# Patient Record
Sex: Female | Born: 1970 | Race: White | Hispanic: No | Smoking: Current every day smoker
Health system: Southern US, Community
[De-identification: ages and names within clinical notes are randomized; demographics above are authoritative.]

## PROBLEM LIST (undated history)

## (undated) DIAGNOSIS — K219 Gastro-esophageal reflux disease without esophagitis: Secondary | ICD-10-CM

## (undated) DIAGNOSIS — G629 Polyneuropathy, unspecified: Secondary | ICD-10-CM

## (undated) DIAGNOSIS — T7840XA Allergy, unspecified, initial encounter: Secondary | ICD-10-CM

## (undated) DIAGNOSIS — E1140 Type 2 diabetes mellitus with diabetic neuropathy, unspecified: Secondary | ICD-10-CM

## (undated) DIAGNOSIS — F329 Major depressive disorder, single episode, unspecified: Secondary | ICD-10-CM

## (undated) DIAGNOSIS — R519 Headache, unspecified: Secondary | ICD-10-CM

## (undated) DIAGNOSIS — F32A Depression, unspecified: Secondary | ICD-10-CM

## (undated) DIAGNOSIS — Z972 Presence of dental prosthetic device (complete) (partial): Secondary | ICD-10-CM

## (undated) DIAGNOSIS — F419 Anxiety disorder, unspecified: Secondary | ICD-10-CM

## (undated) DIAGNOSIS — R51 Headache: Secondary | ICD-10-CM

## (undated) DIAGNOSIS — Z8669 Personal history of other diseases of the nervous system and sense organs: Secondary | ICD-10-CM

## (undated) DIAGNOSIS — D649 Anemia, unspecified: Secondary | ICD-10-CM

## (undated) DIAGNOSIS — N390 Urinary tract infection, site not specified: Secondary | ICD-10-CM

## (undated) HISTORY — DX: Personal history of other diseases of the nervous system and sense organs: Z86.69

## (undated) HISTORY — DX: Depression, unspecified: F32.A

## (undated) HISTORY — DX: Allergy, unspecified, initial encounter: T78.40XA

## (undated) HISTORY — PX: TONSILLECTOMY: SUR1361

## (undated) HISTORY — DX: Gastro-esophageal reflux disease without esophagitis: K21.9

## (undated) HISTORY — DX: Major depressive disorder, single episode, unspecified: F32.9

---

## 1996-09-17 HISTORY — PX: TUBAL LIGATION: SHX77

## 2006-04-18 ENCOUNTER — Emergency Department: Payer: Self-pay | Admitting: Unknown Physician Specialty

## 2006-04-20 ENCOUNTER — Other Ambulatory Visit: Payer: Self-pay

## 2006-04-20 ENCOUNTER — Emergency Department: Payer: Self-pay | Admitting: Emergency Medicine

## 2007-10-22 ENCOUNTER — Other Ambulatory Visit: Payer: Self-pay

## 2007-10-22 ENCOUNTER — Ambulatory Visit: Payer: Self-pay | Admitting: Internal Medicine

## 2007-11-27 ENCOUNTER — Ambulatory Visit: Payer: Self-pay | Admitting: Internal Medicine

## 2008-09-08 ENCOUNTER — Ambulatory Visit: Payer: Self-pay | Admitting: Specialist

## 2008-11-21 ENCOUNTER — Emergency Department: Payer: Self-pay | Admitting: Emergency Medicine

## 2010-09-30 ENCOUNTER — Emergency Department: Payer: Self-pay | Admitting: Emergency Medicine

## 2011-01-10 ENCOUNTER — Emergency Department: Payer: Self-pay | Admitting: Emergency Medicine

## 2011-02-09 ENCOUNTER — Emergency Department: Payer: Self-pay | Admitting: Emergency Medicine

## 2011-02-12 ENCOUNTER — Emergency Department: Payer: Self-pay | Admitting: Emergency Medicine

## 2011-07-09 ENCOUNTER — Emergency Department: Payer: Self-pay | Admitting: *Deleted

## 2011-07-18 ENCOUNTER — Encounter: Payer: Self-pay | Admitting: Internal Medicine

## 2011-07-18 ENCOUNTER — Ambulatory Visit (INDEPENDENT_AMBULATORY_CARE_PROVIDER_SITE_OTHER): Payer: PRIVATE HEALTH INSURANCE | Admitting: Internal Medicine

## 2011-07-18 VITALS — BP 136/84 | HR 88 | Resp 16 | Ht 64.0 in | Wt 206.0 lb

## 2011-07-18 DIAGNOSIS — F411 Generalized anxiety disorder: Secondary | ICD-10-CM

## 2011-07-18 DIAGNOSIS — IMO0002 Reserved for concepts with insufficient information to code with codable children: Secondary | ICD-10-CM

## 2011-07-18 DIAGNOSIS — E1140 Type 2 diabetes mellitus with diabetic neuropathy, unspecified: Secondary | ICD-10-CM | POA: Insufficient documentation

## 2011-07-18 DIAGNOSIS — Z124 Encounter for screening for malignant neoplasm of cervix: Secondary | ICD-10-CM | POA: Insufficient documentation

## 2011-07-18 DIAGNOSIS — K219 Gastro-esophageal reflux disease without esophagitis: Secondary | ICD-10-CM

## 2011-07-18 DIAGNOSIS — Z1239 Encounter for other screening for malignant neoplasm of breast: Secondary | ICD-10-CM

## 2011-07-18 DIAGNOSIS — F419 Anxiety disorder, unspecified: Secondary | ICD-10-CM

## 2011-07-18 DIAGNOSIS — E119 Type 2 diabetes mellitus without complications: Secondary | ICD-10-CM

## 2011-07-18 DIAGNOSIS — E1165 Type 2 diabetes mellitus with hyperglycemia: Secondary | ICD-10-CM

## 2011-07-18 MED ORDER — ALPRAZOLAM 0.5 MG PO TABS: 0.5000 mg | ORAL_TABLET | Freq: Two times a day (BID) | ORAL | Status: DC | PRN

## 2011-07-18 NOTE — Patient Instructions (Signed)
Please check your sugars three times daily    1) Fasting  2) pre lunch or a pre dinner  3) a 2 hr post meal check    We will start 70/30 insulin after I have a chance to see your sugars for the next week

## 2011-07-18 NOTE — Progress Notes (Signed)
  Subjective:    Patient ID: [name], female    DOB: Aug 20, 1971, 40 y.o.   MRN: [id number]  HPI  [name] is 40 yo white female with a history of DM x 8 yrs, uncontrolled due to financial difficulties and lack of insurance ,who is transferring care from [facility]. Her current diabetes regimen is currently 1000 mg of metformin bid,   glipizide 5 mg bid,  And 30 units of lantus daily, which she has increased to 40 units.  Her fasting cbg this morning was 240, which is reflective of recent trends,  However , she recently finished a 5 day course of prednisone 50 mg daily  Which was prescribed by an  [facility] ED physician for  An upper respiratory infection accompanied by bronchospasm  .   Past Medical History  Diagnosis Date  . Diabetes mellitus   . allergic rhinitis   . Depression   . GERD (gastroesophageal reflux disease)   . History of migraine headaches    No current outpatient prescriptions on file prior to visit.    Review of Systems  Constitutional: Positive for fever. Negative for chills and unexpected weight change.  HENT: Negative for hearing loss, ear pain, nosebleeds, congestion, sore throat, facial swelling, rhinorrhea, sneezing, mouth sores, trouble swallowing, neck pain, neck stiffness, voice change, postnasal drip, sinus pressure, tinnitus and ear discharge.   Eyes: Negative for pain, discharge, redness and visual disturbance.  Respiratory: Negative for cough, chest tightness, shortness of breath, wheezing and stridor.   Cardiovascular: Negative for chest pain, palpitations and leg swelling.  Musculoskeletal: Negative for myalgias and arthralgias.  Skin: Negative for color change and rash.  Neurological: Negative for dizziness, weakness, light-headedness and headaches.  Hematological: Negative for adenopathy.       Objective:   Physical Exam  Constitutional: She is oriented to person, place, and time. She appears well-developed and well-nourished.  HENT:  Mouth/Throat:  Oropharynx is clear and moist.  Eyes: EOM are normal. Pupils are equal, round, and reactive to light. No scleral icterus.  Neck: Normal range of motion. Neck supple. No JVD present. No thyromegaly present.  Cardiovascular: Normal rate, regular rhythm, normal heart sounds and intact distal pulses.   Pulmonary/Chest: Effort normal and breath sounds normal.  Abdominal: Soft. Bowel sounds are normal. She exhibits no mass. There is no tenderness.  Musculoskeletal: Normal range of motion. She exhibits no edema.  Lymphadenopathy:    She has no cervical adenopathy.  Neurological: She is alert and oriented to person, place, and time.  Skin: Skin is warm and dry.  Psychiatric: She has a normal mood and affect.          Assessment & Plan:

## 2011-07-21 ENCOUNTER — Encounter: Payer: Self-pay | Admitting: Internal Medicine

## 2011-07-21 DIAGNOSIS — K219 Gastro-esophageal reflux disease without esophagitis: Secondary | ICD-10-CM | POA: Insufficient documentation

## 2011-07-21 DIAGNOSIS — F329 Major depressive disorder, single episode, unspecified: Secondary | ICD-10-CM | POA: Insufficient documentation

## 2011-07-21 DIAGNOSIS — Z8669 Personal history of other diseases of the nervous system and sense organs: Secondary | ICD-10-CM | POA: Insufficient documentation

## 2011-07-21 NOTE — Assessment & Plan Note (Signed)
Her current regimen is not covering postprandial highs or getting her fastings under control.  She has requested referral to Diabetes Education since as an [employer] employee she will receive free labs.  Referral in process.  Will change to 70/30 insulin once she supplies a log of blood sugars for one to two weeks. She has no retinopathy by Oct 23 eye exam.

## 2011-07-21 NOTE — Assessment & Plan Note (Addendum)
With no prior endoscopy.  Using ranitidine with good  control of symptoms, no changes today.

## 2011-08-13 ENCOUNTER — Ambulatory Visit: Payer: Self-pay | Admitting: Internal Medicine

## 2011-08-14 ENCOUNTER — Telehealth: Payer: Self-pay | Admitting: Internal Medicine

## 2011-08-14 NOTE — Telephone Encounter (Signed)
Patient notified. Appt scheduled for Monday.

## 2011-08-14 NOTE — Telephone Encounter (Signed)
I have no experience with this drug,  It must have been prescribed by her gynecologist. Adn we have not discussed her menstrual irregularities in a visit.  So she will wither need to call her gynecologist to handle or make an appt to discuss problem with me

## 2011-08-14 NOTE — Telephone Encounter (Addendum)
Patient is taking Reclipsen Adonis Brook) generic for dysmenorrhea. She feels she needs this medication changed because her bleeding is still on and off . Patient would like a call.

## 2011-08-17 LAB — HM PAP SMEAR: HM Pap smear: NORMAL

## 2011-08-18 ENCOUNTER — Ambulatory Visit: Payer: Self-pay | Admitting: Internal Medicine

## 2011-08-20 ENCOUNTER — Ambulatory Visit (INDEPENDENT_AMBULATORY_CARE_PROVIDER_SITE_OTHER): Payer: PRIVATE HEALTH INSURANCE | Admitting: Internal Medicine

## 2011-08-20 ENCOUNTER — Encounter: Payer: Self-pay | Admitting: Internal Medicine

## 2011-08-20 VITALS — BP 100/64 | HR 95 | Temp 98.2°F | Resp 16 | Ht 64.0 in | Wt 201.8 lb

## 2011-08-20 DIAGNOSIS — F411 Generalized anxiety disorder: Secondary | ICD-10-CM

## 2011-08-20 DIAGNOSIS — N920 Excessive and frequent menstruation with regular cycle: Secondary | ICD-10-CM

## 2011-08-20 DIAGNOSIS — F419 Anxiety disorder, unspecified: Secondary | ICD-10-CM

## 2011-08-20 DIAGNOSIS — N921 Excessive and frequent menstruation with irregular cycle: Secondary | ICD-10-CM

## 2011-08-20 DIAGNOSIS — E1165 Type 2 diabetes mellitus with hyperglycemia: Secondary | ICD-10-CM

## 2011-08-20 MED ORDER — SERTRALINE HCL 100 MG PO TABS: 100.0000 mg | ORAL_TABLET | Freq: Every day | ORAL | Status: DC

## 2011-08-20 MED ORDER — ALPRAZOLAM 0.5 MG PO TABS: 0.5000 mg | ORAL_TABLET | Freq: Two times a day (BID) | ORAL | Status: DC | PRN

## 2011-08-20 MED ORDER — DESOGESTREL-ETHINYL ESTRADIOL 0.15-30 MG-MCG PO TABS: 1.0000 | ORAL_TABLET | Freq: Every day | ORAL | Status: DC

## 2011-08-20 MED ORDER — INSULIN ASPART 100 UNIT/ML ~~LOC~~ SOLN: 15.0000 [IU] | Freq: Three times a day (TID) | SUBCUTANEOUS | Status: DC

## 2011-08-20 NOTE — Patient Instructions (Signed)
We are stopping your glipizde and adding Novolog short acting insulin three times daily before meals.  Start with 15 units before each meal.  If your pre meal cbg is > 250,  Add 3 units for every 50 pts above 250.   We will schedule you for an ultasound of your uterus to see why you are bleeding so much

## 2011-08-20 NOTE — Progress Notes (Signed)
Subjective:    Patient ID: [name], female    DOB: 1971-02-17, 40 y.o.   MRN: [id number]  HPI  40 yo with history of diabetes melllitus and painful menses with heavy clotting, placed  On OCPs by prior PCP presents with intermenstrual bleeding and spotting.  No prior ultrasound to consider fibroids as a source .  Bleeding increased when she was switched from name brand to generic OCPS .    2nd issue is uncontrolled BS, have never been below 200.  2 hr post prandialwas recently 415 and fasting today was 300. She is using 40 units of lantus daily since last visit.  Past Medical History  Diagnosis Date  . Diabetes mellitus   . allergic rhinitis   . Depression   . GERD (gastroesophageal reflux disease)   . History of migraine headaches    Current Outpatient Prescriptions on File Prior to Visit  Medication Sig Dispense Refill  . aspirin 81 MG tablet Take 81 mg by mouth daily.        . cycloSPORINE (RESTASIS) 0.05 % ophthalmic emulsion Place 1 drop into both eyes 2 (two) times daily.        Marland Kitchen glipiZIDE (GLUCOTROL XL) 5 MG 24 hr tablet Take 5 mg by mouth daily.        . insulin glargine (LANTUS) 100 UNIT/ML injection Inject 30 Units into the skin at bedtime.        Marland Kitchen loteprednol (LOTEMAX) 0.5 % ophthalmic suspension Place 1 drop into both eyes 2 (two) times daily.        . metFORMIN (GLUMETZA) 500 MG (MOD) 24 hr tablet Take 1,000 mg by mouth daily with breakfast.        . ranitidine (ZANTAC) 150 MG tablet Take 150 mg by mouth 2 (two) times daily.           Review of Systems  Constitutional: Negative for fever, chills and unexpected weight change.  HENT: Negative for hearing loss, ear pain, nosebleeds, congestion, sore throat, facial swelling, rhinorrhea, sneezing, mouth sores, trouble swallowing, neck pain, neck stiffness, voice change, postnasal drip, sinus pressure, tinnitus and ear discharge.   Eyes: Negative for pain, discharge, redness and visual disturbance.  Respiratory: Negative for  cough, chest tightness, shortness of breath, wheezing and stridor.   Cardiovascular: Negative for chest pain, palpitations and leg swelling.  Genitourinary: Positive for vaginal bleeding and menstrual problem.  Musculoskeletal: Negative for myalgias and arthralgias.  Skin: Negative for color change and rash.  Neurological: Negative for dizziness, weakness, light-headedness and headaches.  Hematological: Negative for adenopathy.      BP 100/64  Pulse 95  Temp(Src) 98.2 F (36.8 C) (Oral)  Resp 16  Ht 5\' 4"  (1.626 m)  Wt 201 lb 12 oz (91.513 kg)  BMI 34.63 kg/m2  SpO2 96%  LMP 08/19/2011  Objective:   Physical Exam  Constitutional: She is oriented to person, place, and time. She appears well-developed and well-nourished.  HENT:  Mouth/Throat: Oropharynx is clear and moist.  Eyes: EOM are normal. Pupils are equal, round, and reactive to light. No scleral icterus.  Neck: Normal range of motion. Neck supple. No JVD present. No thyromegaly present.  Cardiovascular: Normal rate, regular rhythm, normal heart sounds and intact distal pulses.   Pulmonary/Chest: Effort normal and breath sounds normal.  Abdominal: Soft. Bowel sounds are normal. She exhibits no mass. There is no tenderness.  Musculoskeletal: Normal range of motion. She exhibits no edema.  Lymphadenopathy:    She has no  cervical adenopathy.  Neurological: She is alert and oriented to person, place, and time.  Skin: Skin is warm and dry.  Psychiatric: She has a normal mood and affect.          Assessment & Plan:

## 2011-08-21 ENCOUNTER — Encounter: Payer: Self-pay | Admitting: Internal Medicine

## 2011-08-21 DIAGNOSIS — N921 Excessive and frequent menstruation with irregular cycle: Secondary | ICD-10-CM | POA: Insufficient documentation

## 2011-08-21 NOTE — Assessment & Plan Note (Signed)
Will need to obtain ultrasound to evaluate uterus for fibroids. Will switch to brandname OCPS for now.

## 2011-08-21 NOTE — Assessment & Plan Note (Signed)
Given poor conttol with glipizide and Lantus, will stop glipizide and add short acting insulin pre meals tid

## 2011-08-24 ENCOUNTER — Telehealth: Payer: Self-pay | Admitting: Internal Medicine

## 2011-08-24 MED ORDER — INSULIN PEN NEEDLE 31G X 6 MM MISC: Status: DC

## 2011-08-24 MED ORDER — LANCETS MISC: Status: AC

## 2011-08-24 NOTE — Telephone Encounter (Signed)
Both Rxs have been called in 

## 2011-08-24 NOTE — Telephone Encounter (Signed)
[phone]  Pt called needs rx sent to [facility] [phone] for  Needles for flex pens for insulin pt would like 6mm not 8mm She also needs the lancet for the machine   accu check  aviva

## 2011-08-29 ENCOUNTER — Ambulatory Visit: Payer: Self-pay | Admitting: Internal Medicine

## 2011-08-30 ENCOUNTER — Telehealth: Payer: Self-pay | Admitting: Internal Medicine

## 2011-08-30 NOTE — Telephone Encounter (Signed)
Her pelvic ultrasound was unremarklable,  No fibroids seen .

## 2011-08-31 ENCOUNTER — Telehealth: Payer: Self-pay | Admitting: *Deleted

## 2011-08-31 NOTE — Telephone Encounter (Signed)
Patient notified

## 2011-08-31 NOTE — Telephone Encounter (Signed)
Yes, patient has been notified.   Patient is still having a lot of pain. She is asking what can be done at this point .

## 2011-08-31 NOTE — Telephone Encounter (Signed)
Patient is asking for her [place] results from wed. Have you seen them?

## 2011-08-31 NOTE — Telephone Encounter (Signed)
Yes, sent message to [facility] yesterday.  Normal ultrasound,.  Can you Pls check her box to make sure I routed it?

## 2011-08-31 NOTE — Telephone Encounter (Signed)
My chart notes indicate that her menstrual periods are painful,  Not in between , so she can take alleve or ibuprofen and tylenol every 6 to 8 hours prn.  I

## 2011-09-14 ENCOUNTER — Telehealth: Payer: Self-pay | Admitting: *Deleted

## 2011-09-14 ENCOUNTER — Emergency Department: Payer: Self-pay | Admitting: *Deleted

## 2011-09-14 NOTE — Telephone Encounter (Signed)
Pharmacist at [facility] called to clarify directions on pt's pen needles and lancets- how often is pt to inject insulin and how often to check blood sugar daily.  Instructions on lancet say to check 4 times a day, so if that is correct quantity will need to be increased.  Please call them back at [phone].

## 2011-09-14 NOTE — Telephone Encounter (Signed)
Patient is checking sugars and injecting insulin 4 times daily,  Please increase lancet quantity to 120/month

## 2011-09-17 ENCOUNTER — Ambulatory Visit (INDEPENDENT_AMBULATORY_CARE_PROVIDER_SITE_OTHER): Payer: PRIVATE HEALTH INSURANCE | Admitting: Internal Medicine

## 2011-09-17 ENCOUNTER — Encounter: Payer: Self-pay | Admitting: Internal Medicine

## 2011-09-17 VITALS — BP 122/82 | HR 76 | Temp 97.9°F | Wt 207.0 lb

## 2011-09-17 DIAGNOSIS — D367 Benign neoplasm of other specified sites: Secondary | ICD-10-CM

## 2011-09-17 DIAGNOSIS — D2360 Other benign neoplasm of skin of unspecified upper limb, including shoulder: Secondary | ICD-10-CM

## 2011-09-17 DIAGNOSIS — L0293 Carbuncle, unspecified: Secondary | ICD-10-CM

## 2011-09-17 DIAGNOSIS — L0292 Furuncle, unspecified: Secondary | ICD-10-CM

## 2011-09-17 DIAGNOSIS — R202 Paresthesia of skin: Secondary | ICD-10-CM

## 2011-09-17 DIAGNOSIS — R209 Unspecified disturbances of skin sensation: Secondary | ICD-10-CM

## 2011-09-17 DIAGNOSIS — E1165 Type 2 diabetes mellitus with hyperglycemia: Secondary | ICD-10-CM

## 2011-09-17 MED ORDER — SERTRALINE HCL 100 MG PO TABS: 100.0000 mg | ORAL_TABLET | Freq: Every day | ORAL | Status: DC

## 2011-09-17 MED ORDER — INSULIN ASPART 100 UNIT/ML ~~LOC~~ SOLN: 20.0000 [IU] | Freq: Three times a day (TID) | SUBCUTANEOUS | Status: DC

## 2011-09-17 MED ORDER — INSULIN GLARGINE 100 UNIT/ML ~~LOC~~ SOLN: 55.0000 [IU] | Freq: Every day | SUBCUTANEOUS | Status: DC

## 2011-09-17 MED ORDER — SULFAMETHOXAZOLE-TRIMETHOPRIM 800-160 MG PO TABS: 1.0000 | ORAL_TABLET | Freq: Two times a day (BID) | ORAL | Status: AC

## 2011-09-17 NOTE — Progress Notes (Signed)
Subjective:    Patient ID: [name], female    DOB: January 13, 1971, 40 y.o.   MRN: [id number]  HPI  [name] is a 40 yo diabetic female who presents for evaluation of a painless lump under right arm found two weeks ago.  No history of redness,  Trauma to area (except shaving ) or discharge.  No change in size,  Additionally she, developed numbness on Friday involving the right side of her face and right arm and was sent to ER by employer.  ER evaluation involved a CT of the head, which was normal.  She was treated for lymphadenitis of the right axilla with Augmentin.  One day later she developed pain in the right trapezius muscle , and her righ sided facial numbness spread to the arm and leg. She denies recent exertion,  Current weakness of involved areas,  No nek pain, headaches or fevers . 3rd complaint is persistent hyperglycemia desite use of insulin 4 times daily and dietary adjustments as recommended by Diabetes Educations. Sugars are running 200 to 250 pre meal despite use of Lantus.  She is consuming more than 60 calories daily br detailed renview of her dietary choices, and is not exercising, based on advice apparently given to her by the [facility].  Past Medical History  Diagnosis Date  . Diabetes mellitus   . allergic rhinitis   . Depression   . GERD (gastroesophageal reflux disease)   . History of migraine headaches   . tobacco abuse    Current Outpatient Prescriptions on File Prior to Visit  Medication Sig Dispense Refill  . ALPRAZolam (XANAX) 0.5 MG tablet Take 1 tablet (0.5 mg total) by mouth 2 (two) times daily as needed for sleep or anxiety.  60 tablet  2  . aspirin 81 MG tablet Take 81 mg by mouth daily.        . cycloSPORINE (RESTASIS) 0.05 % ophthalmic emulsion Place 1 drop into both eyes 2 (two) times daily.        Marland Kitchen desogestrel-ethinyl estradiol (RECLIPSEN) 0.15-30 MG-MCG tablet Take 1 tablet by mouth daily.  1 Package  11  . glipiZIDE (GLUCOTROL XL) 5 MG 24 hr tablet  Take 5 mg by mouth daily.        . Insulin Pen Needle 31G X 6 MM MISC Patient test blood sugar two times daily.  100 each  5  . Lancets MISC Patient test blood sugar two times daily.  100 each  5  . metFORMIN (GLUMETZA) 500 MG (MOD) 24 hr tablet Take 1,000 mg by mouth daily with breakfast.        . ranitidine (ZANTAC) 150 MG tablet Take 150 mg by mouth 2 (two) times daily.        Marland Kitchen loteprednol (LOTEMAX) 0.5 % ophthalmic suspension Place 1 drop into both eyes 2 (two) times daily.          Review of Systems  Constitutional: Negative for fever, chills and unexpected weight change.  HENT: Negative for hearing loss, ear pain, nosebleeds, congestion, sore throat, facial swelling, rhinorrhea, sneezing, mouth sores, trouble swallowing, neck pain, neck stiffness, voice change, postnasal drip, sinus pressure, tinnitus and ear discharge.   Eyes: Negative for pain, discharge, redness and visual disturbance.  Respiratory: Negative for cough, chest tightness, shortness of breath, wheezing and stridor.   Cardiovascular: Negative for chest pain, palpitations and leg swelling.  Musculoskeletal: Negative for myalgias and arthralgias.  Skin: Negative for color change and rash.  Neurological: Positive for  numbness. Negative for dizziness, weakness, light-headedness and headaches.  Hematological: Negative for adenopathy.  Psychiatric/Behavioral: Positive for behavioral problems.   BP 122/82  Pulse 76  Temp(Src) 97.9 F (36.6 C) (Oral)  Wt 207 lb (93.895 kg)  LMP 08/19/2011     Objective:   Physical Exam  Constitutional: She is oriented to person, place, and time. She appears well-developed and well-nourished.  HENT:  Mouth/Throat: Oropharynx is clear and moist.  Eyes: EOM are normal. Pupils are equal, round, and reactive to light. No scleral icterus.  Neck: Normal range of motion. Neck supple. No JVD present. No thyromegaly present.  Cardiovascular: Normal rate, regular rhythm, normal heart sounds and  intact distal pulses.   Pulmonary/Chest: Effort normal and breath sounds normal.  Abdominal: Soft. Bowel sounds are normal. She exhibits no mass. There is no tenderness.  Musculoskeletal: Normal range of motion. She exhibits no edema.  Lymphadenopathy:    She has no cervical adenopathy.    She has no axillary adenopathy.  Neurological: She is alert and oriented to person, place, and time. A sensory deficit is present.       Decreased sensation to pinprick on the right side  Skin: Skin is warm and dry.     Psychiatric: She has a normal mood and affect.      Assessment & Plan:

## 2011-09-17 NOTE — Telephone Encounter (Signed)
Advised pharmacist. 

## 2011-09-18 ENCOUNTER — Ambulatory Visit: Payer: Self-pay | Admitting: Internal Medicine

## 2011-09-18 ENCOUNTER — Encounter: Payer: Self-pay | Admitting: Internal Medicine

## 2011-09-18 DIAGNOSIS — R202 Paresthesia of skin: Secondary | ICD-10-CM | POA: Insufficient documentation

## 2011-09-18 DIAGNOSIS — D367 Benign neoplasm of other specified sites: Secondary | ICD-10-CM | POA: Insufficient documentation

## 2011-09-18 NOTE — Assessment & Plan Note (Signed)
Spent 10 to 15 minutes reviewing current dietary choices and lack of exercise and made recommendations .  Did not increase insulin sodes but updated chart to patient's current titrated doses. Last A1c was 11

## 2011-09-18 NOTE — Assessment & Plan Note (Signed)
Etiology unclear but need to consider  MS given her age.  MRI brain ordered.

## 2011-09-18 NOTE — Assessment & Plan Note (Signed)
the "lymph node" under her right arm is entirely superficial and appears to be a cyst or small fueuncle.,  Will change abx to septra for coverage of MRSA and reassess in one week.  If still present will send to Gen Surg for removal.

## 2011-09-20 ENCOUNTER — Ambulatory Visit: Payer: Self-pay | Admitting: Internal Medicine

## 2011-09-20 ENCOUNTER — Encounter: Payer: Self-pay | Admitting: Internal Medicine

## 2011-09-21 ENCOUNTER — Telehealth: Payer: Self-pay | Admitting: Internal Medicine

## 2011-09-21 ENCOUNTER — Emergency Department: Payer: Self-pay | Admitting: Unknown Physician Specialty

## 2011-09-21 NOTE — Telephone Encounter (Signed)
Advised pt results aren't back yet.  She has appt on Monday and will discuss then.

## 2011-09-21 NOTE — Telephone Encounter (Signed)
Patient is waiting on results from her MRI.

## 2011-09-24 ENCOUNTER — Ambulatory Visit: Payer: PRIVATE HEALTH INSURANCE | Admitting: Internal Medicine

## 2011-09-24 ENCOUNTER — Ambulatory Visit (INDEPENDENT_AMBULATORY_CARE_PROVIDER_SITE_OTHER): Payer: PRIVATE HEALTH INSURANCE | Admitting: Internal Medicine

## 2011-09-24 ENCOUNTER — Encounter: Payer: Self-pay | Admitting: Internal Medicine

## 2011-09-24 DIAGNOSIS — E1165 Type 2 diabetes mellitus with hyperglycemia: Secondary | ICD-10-CM

## 2011-09-24 DIAGNOSIS — D367 Benign neoplasm of other specified sites: Secondary | ICD-10-CM

## 2011-09-24 DIAGNOSIS — E119 Type 2 diabetes mellitus without complications: Secondary | ICD-10-CM

## 2011-09-24 DIAGNOSIS — R200 Anesthesia of skin: Secondary | ICD-10-CM

## 2011-09-24 DIAGNOSIS — R209 Unspecified disturbances of skin sensation: Secondary | ICD-10-CM

## 2011-09-24 DIAGNOSIS — D2360 Other benign neoplasm of skin of unspecified upper limb, including shoulder: Secondary | ICD-10-CM

## 2011-09-24 MED ORDER — FLUCONAZOLE 150 MG PO TABS: 150.0000 mg | ORAL_TABLET | Freq: Every day | ORAL | Status: AC

## 2011-09-24 MED ORDER — GLUCOSE BLOOD VI STRP: ORAL_STRIP | Status: AC

## 2011-09-24 NOTE — Assessment & Plan Note (Signed)
Sliding scale insulin adjusted for lower vcarb meals. contineu lantus

## 2011-09-24 NOTE — Assessment & Plan Note (Signed)
Improved with empiric abx therapy

## 2011-09-24 NOTE — Progress Notes (Signed)
Subjective:    Patient ID: [name], female    DOB: 19-Sep-1970, 41 y.o.   MRN: [id number]  HPI  Returns for follow up on diabetes and right axillary swelling consistent with sebaceous cyst. Treated with septra for a week to eradicate any possible staph infection .  Cyst is smaller.  Since she has changed her diet to low carb, she has been having blood sugars much  Lower in the low 100s  Which has made her feel tremulous and cuased her employer to send her to [facility] [employer], wwho sent her to the ER bc they did not have a functioning glucometer.  Past Medical History  Diagnosis Date  . Diabetes mellitus   . allergic rhinitis   . Depression   . GERD (gastroesophageal reflux disease)   . History of migraine headaches   . tobacco abuse    Current Outpatient Prescriptions on File Prior to Visit  Medication Sig Dispense Refill  . ALPRAZolam (XANAX) 0.5 MG tablet Take 1 tablet (0.5 mg total) by mouth 2 (two) times daily as needed for sleep or anxiety.  60 tablet  2  . amoxicillin-clavulanate (AUGMENTIN) 875-125 MG per tablet Take 1 tablet by mouth 2 (two) times daily.        Marland Kitchen aspirin 81 MG tablet Take 81 mg by mouth daily.        . cycloSPORINE (RESTASIS) 0.05 % ophthalmic emulsion Place 1 drop into both eyes 2 (two) times daily.        Marland Kitchen desogestrel-ethinyl estradiol (RECLIPSEN) 0.15-30 MG-MCG tablet Take 1 tablet by mouth daily.  1 Package  11  . glipiZIDE (GLUCOTROL XL) 5 MG 24 hr tablet Take 5 mg by mouth daily.        . insulin aspart (NOVOLOG FLEXPEN) 100 UNIT/ML injection Inject 20 Units into the skin 3 (three) times daily before meals.  10 pen  12  . insulin glargine (LANTUS) 100 UNIT/ML injection Inject 55 Units into the skin at bedtime.  30 mL  3  . Insulin Pen Needle 31G X 6 MM MISC Patient test blood sugar two times daily.  100 each  5  . Lancets MISC Patient test blood sugar two times daily.  100 each  5  . loteprednol (LOTEMAX) 0.5 % ophthalmic suspension Place 1 drop into both  eyes 2 (two) times daily.        . metFORMIN (GLUMETZA) 500 MG (MOD) 24 hr tablet Take 1,000 mg by mouth daily with breakfast.        . ranitidine (ZANTAC) 150 MG tablet Take 150 mg by mouth 2 (two) times daily.        . sertraline (ZOLOFT) 100 MG tablet Take 1 tablet (100 mg total) by mouth daily.  90 tablet  3  . sulfamethoxazole-trimethoprim (SEPTRA DS) 800-160 MG per tablet Take 1 tablet by mouth 2 (two) times daily.  14 tablet  0    Review of Systems  Constitutional: Negative for fever, chills and unexpected weight change.  HENT: Negative for hearing loss, ear pain, nosebleeds, congestion, sore throat, facial swelling, rhinorrhea, sneezing, mouth sores, trouble swallowing, neck pain, neck stiffness, voice change, postnasal drip, sinus pressure, tinnitus and ear discharge.   Eyes: Negative for pain, discharge, redness and visual disturbance.  Respiratory: Negative for cough, chest tightness, shortness of breath, wheezing and stridor.   Cardiovascular: Negative for chest pain, palpitations and leg swelling.  Musculoskeletal: Negative for myalgias and arthralgias.  Skin: Negative for color change and rash.  Neurological: Negative for dizziness, weakness, light-headedness and headaches.  Hematological: Negative for adenopathy.       Objective:   Physical Exam  Constitutional: She is oriented to person, place, and time. She appears well-developed and well-nourished.  HENT:  Mouth/Throat: Oropharynx is clear and moist.  Eyes: EOM are normal. Pupils are equal, round, and reactive to light. No scleral icterus.  Neck: Normal range of motion. Neck supple. No JVD present. No thyromegaly present.  Cardiovascular: Normal rate, regular rhythm, normal heart sounds and intact distal pulses.   Pulmonary/Chest: Effort normal and breath sounds normal.  Abdominal: Soft. Bowel sounds are normal. She exhibits no mass. There is no tenderness.  Musculoskeletal: Normal range of motion. She exhibits no  edema.  Lymphadenopathy:    She has no cervical adenopathy.    She has no axillary adenopathy.  Neurological: She is alert and oriented to person, place, and time. A sensory deficit is present.       Decreased sensation to pinprick on the right side  Skin: Skin is warm and dry.     Psychiatric: She has a normal mood and affect.          Assessment & Plan:   Numbness and tingling of right arm and leg MRI was reportedly normal of brain and symptoms have improved with immporved blood sugar control.  Dermoid cyst of arm Improved with empiric abx therapy  Diabetes mellitus type 2, uncontrolled Sliding scale insulin adjusted for lower vcarb meals. contineu lantus     Updated Medication List Outpatient Encounter Prescriptions as of 09/24/2011  Medication Sig Dispense Refill  . ALPRAZolam (XANAX) 0.5 MG tablet Take 1 tablet (0.5 mg total) by mouth 2 (two) times daily as needed for sleep or anxiety.  60 tablet  2  . amoxicillin-clavulanate (AUGMENTIN) 875-125 MG per tablet Take 1 tablet by mouth 2 (two) times daily.        Marland Kitchen aspirin 81 MG tablet Take 81 mg by mouth daily.        . cycloSPORINE (RESTASIS) 0.05 % ophthalmic emulsion Place 1 drop into both eyes 2 (two) times daily.        Marland Kitchen desogestrel-ethinyl estradiol (RECLIPSEN) 0.15-30 MG-MCG tablet Take 1 tablet by mouth daily.  1 Package  11  . glipiZIDE (GLUCOTROL XL) 5 MG 24 hr tablet Take 5 mg by mouth daily.        . insulin aspart (NOVOLOG FLEXPEN) 100 UNIT/ML injection Inject 20 Units into the skin 3 (three) times daily before meals.  10 pen  12  . insulin glargine (LANTUS) 100 UNIT/ML injection Inject 55 Units into the skin at bedtime.  30 mL  3  . Insulin Pen Needle 31G X 6 MM MISC Patient test blood sugar two times daily.  100 each  5  . Lancets MISC Patient test blood sugar two times daily.  100 each  5  . loteprednol (LOTEMAX) 0.5 % ophthalmic suspension Place 1 drop into both eyes 2 (two) times daily.        . metFORMIN  (GLUMETZA) 500 MG (MOD) 24 hr tablet Take 1,000 mg by mouth daily with breakfast.        . ranitidine (ZANTAC) 150 MG tablet Take 150 mg by mouth 2 (two) times daily.        . sertraline (ZOLOFT) 100 MG tablet Take 1 tablet (100 mg total) by mouth daily.  90 tablet  3  . sulfamethoxazole-trimethoprim (SEPTRA DS) 800-160 MG per tablet Take 1 tablet  by mouth 2 (two) times daily.  14 tablet  0  . fluconazole (DIFLUCAN) 150 MG tablet Take 1 tablet (150 mg total) by mouth daily.  2 tablet  0  . glucose blood test strip Use as instructed  120 each  12

## 2011-09-24 NOTE — Patient Instructions (Signed)
Insulin sliding scale change:   For a pre meal blood sugar  Of  120  to 150   Use  5 units  If the meal is a low carb meal (salad, eggs,   etc)  .  Add 2 units for every 50 pts above 150.    Keep the lantus at 55 units  for now

## 2011-09-24 NOTE — Assessment & Plan Note (Signed)
MRI was reportedly normal of brain and symptoms have improved with immporved blood sugar control.

## 2011-09-26 ENCOUNTER — Telehealth: Payer: Self-pay | Admitting: Internal Medicine

## 2011-09-26 NOTE — Telephone Encounter (Signed)
Advised pt

## 2011-09-26 NOTE — Telephone Encounter (Signed)
MRI of brain was indeed normal.  No signs of MS

## 2011-09-27 ENCOUNTER — Other Ambulatory Visit: Payer: Self-pay | Admitting: *Deleted

## 2011-09-27 NOTE — Telephone Encounter (Signed)
Faxed request from [facility], last filled 08/13/11.

## 2011-09-28 MED ORDER — SERTRALINE HCL 100 MG PO TABS: 100.0000 mg | ORAL_TABLET | Freq: Every day | ORAL | Status: DC

## 2011-10-15 ENCOUNTER — Encounter: Payer: Self-pay | Admitting: Internal Medicine

## 2011-10-17 ENCOUNTER — Other Ambulatory Visit: Payer: Self-pay | Admitting: *Deleted

## 2011-10-17 NOTE — Telephone Encounter (Signed)
Faxed request from [facility], request is for metformin Er 500 mg's, take 2 tablets 2 times a day, chart has to take 2 tablets once a day.

## 2011-10-18 ENCOUNTER — Ambulatory Visit: Payer: PRIVATE HEALTH INSURANCE | Admitting: Internal Medicine

## 2011-10-18 MED ORDER — METFORMIN HCL ER (OSM) 1000 MG PO TB24: 1000.0000 mg | ORAL_TABLET | Freq: Every day | ORAL | Status: DC

## 2011-10-18 NOTE — Telephone Encounter (Signed)
Increased the tablet strenght to 1000 mg, .  Dose is 2 tablets daily

## 2011-10-18 NOTE — Telephone Encounter (Signed)
Directions changed on script to one twice a day.

## 2011-10-25 ENCOUNTER — Telehealth: Payer: Self-pay | Admitting: Internal Medicine

## 2011-10-25 DIAGNOSIS — N760 Acute vaginitis: Secondary | ICD-10-CM

## 2011-10-25 MED ORDER — FLUCONAZOLE 150 MG PO TABS: 150.0000 mg | ORAL_TABLET | Freq: Every day | ORAL | Status: AC

## 2011-10-25 NOTE — Telephone Encounter (Signed)
Fluconazole tablets one tablet daily  #2 e mailed to [facility]

## 2011-11-15 ENCOUNTER — Other Ambulatory Visit: Payer: Self-pay | Admitting: Family Medicine

## 2011-11-15 LAB — BASIC METABOLIC PANEL
Anion Gap: 12 (ref 7–16)
BUN: 9 mg/dL (ref 7–18)
Calcium, Total: 9.4 mg/dL (ref 8.5–10.1)
Chloride: 101 mmol/L (ref 98–107)
Co2: 27 mmol/L (ref 21–32)
Creatinine: 0.84 mg/dL (ref 0.60–1.30)
EGFR (African American): >60
EGFR (Non-African Amer.): >60
Glucose: 225 mg/dL — ABNORMAL HIGH (ref 65–99)
Osmolality: 285 (ref 275–301)
Potassium: 3.9 mmol/L (ref 3.5–5.1)
Sodium: 140 mmol/L (ref 136–145)

## 2011-11-19 ENCOUNTER — Encounter: Payer: Self-pay | Admitting: Internal Medicine

## 2011-11-19 ENCOUNTER — Other Ambulatory Visit: Payer: Self-pay | Admitting: Internal Medicine

## 2011-11-19 ENCOUNTER — Ambulatory Visit (INDEPENDENT_AMBULATORY_CARE_PROVIDER_SITE_OTHER): Payer: PRIVATE HEALTH INSURANCE | Admitting: Internal Medicine

## 2011-11-19 VITALS — BP 118/70 | HR 106 | Temp 99.6°F | Resp 16 | Ht 64.0 in | Wt 206.2 lb

## 2011-11-19 DIAGNOSIS — J351 Hypertrophy of tonsils: Secondary | ICD-10-CM

## 2011-11-19 DIAGNOSIS — J029 Acute pharyngitis, unspecified: Secondary | ICD-10-CM | POA: Insufficient documentation

## 2011-11-19 LAB — CBC WITH DIFFERENTIAL/PLATELET
Basophil %: 0.2 %
Eosinophil %: 0.9 %
HCT: 42.4 % (ref 35.0–47.0)
Lymphocyte #: 0.7 10*3/uL — ABNORMAL LOW (ref 1.0–3.6)
Lymphocyte %: 11.8 %
MCH: 29.3 pg (ref 26.0–34.0)
MCHC: 33 g/dL (ref 32.0–36.0)
MCV: 89 fL (ref 80–100)
Monocyte #: 0.6 10*3/uL (ref 0.0–0.7)
Monocyte %: 9.3 %
Neutrophil #: 4.9 10*3/uL (ref 1.4–6.5)
Neutrophil %: 77.8 %
Platelet: 218 10*3/uL (ref 150–440)
RBC: 4.78 10*6/uL (ref 3.80–5.20)

## 2011-11-19 MED ORDER — HYDROCODONE-ACETAMINOPHEN 10-325 MG PO TABS: 1.0000 | ORAL_TABLET | Freq: Three times a day (TID) | ORAL | Status: AC | PRN

## 2011-11-19 MED ORDER — PREDNISONE (PAK) 10 MG PO TABS: ORAL_TABLET | ORAL | Status: AC

## 2011-11-19 MED ORDER — METHYLPREDNISOLONE ACETATE PF 40 MG/ML IJ SUSP
40.0000 mg | Freq: Once | INTRAMUSCULAR | Status: AC
  Administered 2011-11-19: 40 mg via INTRAMUSCULAR

## 2011-11-19 NOTE — Assessment & Plan Note (Signed)
She has been treated for Strep pharyngitis since Thursday with Augmentin.  Given her HEENT exam. I trsted her for IM with a monospot which was negative. Will continue augmentin, add prednisone for ameloriation of symptoms of ear pain and throat pain. . Check CBC and blood cultures and refer to ENT for evaluation of abnormal tonsils which are enlarged and filled with white placques.

## 2011-11-19 NOTE — Progress Notes (Signed)
Subjective:    Patient ID: [name], female    DOB: 1970-10-02, 41 y.o.   MRN: [id number]  HPI  54 yr white female with history of diabetes presents with ear pain and sore throat.  She was treated for treated for sore throat and right ear pain on Wednesday with claritin by some PA at Urgent Care .  She returned to Urgent Care on Thursday with fever , elevated bp 169/101 , then 191 /89,  with augmentin for strep pharyngitis and lisinopril 5 mg daily.  Seh has contineud to have daily fevers but states that the temps over the past 48 hours has been  99 to 100.   Right ear bothering her the most.    Past Medical History  Diagnosis Date  . Diabetes mellitus   . allergic rhinitis   . Depression   . GERD (gastroesophageal reflux disease)   . History of migraine headaches   . tobacco abuse    Current Outpatient Prescriptions on File Prior to Visit  Medication Sig Dispense Refill  . ALPRAZolam (XANAX) 0.5 MG tablet Take 1 tablet (0.5 mg total) by mouth 2 (two) times daily as needed for sleep or anxiety.  60 tablet  2  . amoxicillin-clavulanate (AUGMENTIN) 875-125 MG per tablet Take 1 tablet by mouth 2 (two) times daily.        Marland Kitchen aspirin 81 MG tablet Take 81 mg by mouth daily.        . cycloSPORINE (RESTASIS) 0.05 % ophthalmic emulsion Place 1 drop into both eyes 2 (two) times daily.        Marland Kitchen desogestrel-ethinyl estradiol (RECLIPSEN) 0.15-30 MG-MCG tablet Take 1 tablet by mouth daily.  1 Package  11  . glucose blood test strip Use as instructed  120 each  12  . insulin aspart (NOVOLOG FLEXPEN) 100 UNIT/ML injection Inject 20 Units into the skin 3 (three) times daily before meals.  10 pen  12  . insulin glargine (LANTUS) 100 UNIT/ML injection Inject 55 Units into the skin at bedtime.  30 mL  3  . Insulin Pen Needle 31G X 6 MM MISC Patient test blood sugar two times daily.  100 each  5  . Lancets MISC Patient test blood sugar two times daily.  100 each  5  . metFORMIN (FORTAMET) 1000 MG (OSM) 24 hr  tablet Take 1 tablet (1,000 mg total) by mouth daily with breakfast.  60 tablet  11  . ranitidine (ZANTAC) 150 MG tablet Take 150 mg by mouth 2 (two) times daily.        . sertraline (ZOLOFT) 100 MG tablet Take 1 tablet (100 mg total) by mouth daily.  30 tablet  4   No current facility-administered medications on file prior to visit.    Review of Systems  Constitutional: Positive for chills and fatigue. Negative for fever and unexpected weight change.  HENT: Positive for ear pain and sore throat. Negative for hearing loss, nosebleeds, congestion, facial swelling, rhinorrhea, sneezing, mouth sores, trouble swallowing, neck pain, neck stiffness, voice change, postnasal drip, sinus pressure, tinnitus and ear discharge.   Eyes: Negative for pain, discharge, redness and visual disturbance.  Respiratory: Positive for apnea. Negative for cough, chest tightness, shortness of breath, wheezing and stridor.   Cardiovascular: Negative for chest pain, palpitations and leg swelling.  Musculoskeletal: Negative for myalgias and arthralgias.  Skin: Negative for color change and rash.  Neurological: Negative for dizziness, weakness, light-headedness and headaches.  Hematological: Negative for adenopathy.  Objective:   Physical Exam  Constitutional: She is oriented to person, place, and time. She appears well-developed and well-nourished.  HENT:  Mouth/Throat: Oropharynx is clear and moist.    Eyes: EOM are normal. Pupils are equal, round, and reactive to light. No scleral icterus.  Neck: Normal range of motion. Neck supple. No JVD present. No thyromegaly present.  Cardiovascular: Normal rate, regular rhythm, normal heart sounds and intact distal pulses.   Pulmonary/Chest: Effort normal and breath sounds normal.  Abdominal: Soft. Bowel sounds are normal. She exhibits no mass. There is no tenderness.  Musculoskeletal: Normal range of motion. She exhibits no edema.  Lymphadenopathy:    She has no  cervical adenopathy.  Neurological: She is alert and oriented to person, place, and time.  Skin: Skin is warm and dry.  Psychiatric: She has a normal mood and affect.      Assessment & Plan:   Pharyngitis She has been treated for Strep pharyngitis since Thursday with Augmentin.  Given her HEENT exam. I trsted her for IM with a monospot which was negative. Will continue augmentin, add prednisone for ameloriation of symptoms of ear pain and throat pain. . Check CBC and blood cultures and refer to ENT for evaluation of abnormal tonsils which are enlarged and filled with white placques.     Updated Medication List Outpatient Encounter Prescriptions as of 11/19/2011  Medication Sig Dispense Refill  . ALPRAZolam (XANAX) 0.5 MG tablet Take 1 tablet (0.5 mg total) by mouth 2 (two) times daily as needed for sleep or anxiety.  60 tablet  2  . amoxicillin-clavulanate (AUGMENTIN) 875-125 MG per tablet Take 1 tablet by mouth 2 (two) times daily.        Marland Kitchen aspirin 81 MG tablet Take 81 mg by mouth daily.        . cycloSPORINE (RESTASIS) 0.05 % ophthalmic emulsion Place 1 drop into both eyes 2 (two) times daily.        Marland Kitchen desogestrel-ethinyl estradiol (RECLIPSEN) 0.15-30 MG-MCG tablet Take 1 tablet by mouth daily.  1 Package  11  . glucose blood test strip Use as instructed  120 each  12  . insulin aspart (NOVOLOG FLEXPEN) 100 UNIT/ML injection Inject 20 Units into the skin 3 (three) times daily before meals.  10 pen  12  . insulin glargine (LANTUS) 100 UNIT/ML injection Inject 55 Units into the skin at bedtime.  30 mL  3  . Insulin Pen Needle 31G X 6 MM MISC Patient test blood sugar two times daily.  100 each  5  . Lancets MISC Patient test blood sugar two times daily.  100 each  5  . lisinopril (PRINIVIL,ZESTRIL) 5 MG tablet Take 5 mg by mouth daily.      . metFORMIN (FORTAMET) 1000 MG (OSM) 24 hr tablet Take 1 tablet (1,000 mg total) by mouth daily with breakfast.  60 tablet  11  . ranitidine (ZANTAC) 150  MG tablet Take 150 mg by mouth 2 (two) times daily.        . sertraline (ZOLOFT) 100 MG tablet Take 1 tablet (100 mg total) by mouth daily.  30 tablet  4  . HYDROcodone-acetaminophen (NORCO) 10-325 MG per tablet Take 1 tablet by mouth every 8 (eight) hours as needed for pain.  30 tablet  0  . predniSONE (STERAPRED UNI-PAK) 10 MG tablet 6 tablets on Day 1 , then reduce by 1 tablet daily until gone  21 tablet  0  . DISCONTD: glipiZIDE (GLUCOTROL XL)  5 MG 24 hr tablet Take 5 mg by mouth daily.        Marland Kitchen DISCONTD: loteprednol (LOTEMAX) 0.5 % ophthalmic suspension Place 1 drop into both eyes 2 (two) times daily.         Facility-Administered Encounter Medications as of 11/19/2011  Medication Dose Route Frequency Provider Last Rate Last Dose  . methylPREDNISolone acetate PF (DEPO-MEDROL) injection 40 mg  40 mg Intramuscular Once Duncan Dull, MD   40 mg at 11/19/11 1340

## 2011-11-19 NOTE — Patient Instructions (Addendum)
I am adding sudafed PE  10 to 30 every 6 hours to manage the pain in your ear and sinsuses  Add 1 squirt Afrin in each nostril twice daily for 5 days .   Prednisone for the inflammation: 60 mg on Day 1,  Decrease by 10 mg every day until gone  Continue the augmentin to cover bacterial infection .    Will add vicodin  For the back pain   ENT eval for your abnormal throat exam

## 2011-11-20 ENCOUNTER — Telehealth: Payer: Self-pay | Admitting: Internal Medicine

## 2011-11-20 NOTE — Telephone Encounter (Signed)
Morrie Sheldon please ask Carollee Herter to see if ENt can see her today .  Reason enlarging painful tonsils despite 5 days of antibiotics and addition of prednisone.

## 2011-11-20 NOTE — Telephone Encounter (Signed)
Patient called and stated she feels like her throat is more swollen than yesterday and feels like the right side is worse than the left, she also stated her Right ear is still painful.  She reports still having a low grade fever as well.  She is still taking the antibiotic and prednisone.  She wanted to know if she should do anything else.  Please advise.

## 2011-11-20 NOTE — Telephone Encounter (Signed)
Patient is going to see Dr. Andee Poles today at 1:30.

## 2011-11-21 ENCOUNTER — Telehealth: Payer: Self-pay | Admitting: Internal Medicine

## 2011-11-21 ENCOUNTER — Inpatient Hospital Stay: Payer: Self-pay | Admitting: *Deleted

## 2011-11-21 LAB — COMPREHENSIVE METABOLIC PANEL
Albumin: 3.2 g/dL — ABNORMAL LOW (ref 3.4–5.0)
Alkaline Phosphatase: 69 U/L (ref 50–136)
Anion Gap: 13 (ref 7–16)
BUN: 12 mg/dL (ref 7–18)
Bilirubin,Total: 0.3 mg/dL (ref 0.2–1.0)
Co2: 27 mmol/L (ref 21–32)
Creatinine: 0.74 mg/dL (ref 0.60–1.30)
EGFR (African American): >60
Glucose: 279 mg/dL — ABNORMAL HIGH (ref 65–99)
Osmolality: 287 (ref 275–301)
Potassium: 4 mmol/L (ref 3.5–5.1)
SGPT (ALT): 27 U/L
Sodium: 139 mmol/L (ref 136–145)
Total Protein: 8.5 g/dL — ABNORMAL HIGH (ref 6.4–8.2)

## 2011-11-21 LAB — URINALYSIS, COMPLETE
Glucose,UR: >=500 mg/dL (ref 0–75)
Ketone: NEGATIVE
Leukocyte Esterase: NEGATIVE
Ph: 5 (ref 4.5–8.0)
Protein: NEGATIVE
RBC,UR: 2 /HPF (ref 0–5)
Specific Gravity: 1.046 (ref 1.003–1.030)
WBC UR: 1 /HPF (ref 0–5)

## 2011-11-21 LAB — CBC WITH DIFFERENTIAL/PLATELET
Basophil #: 0 10*3/uL (ref 0.0–0.1)
Basophil %: 0.5 %
Eosinophil %: 1 %
HGB: 13.7 g/dL (ref 12.0–16.0)
Lymphocyte #: 1.3 10*3/uL (ref 1.0–3.6)
MCHC: 33.6 g/dL (ref 32.0–36.0)
MCV: 87 fL (ref 80–100)
Monocyte #: 0.4 10*3/uL (ref 0.0–0.7)
Monocyte %: 8.6 %
Neutrophil #: 3.3 10*3/uL (ref 1.4–6.5)
Neutrophil %: 64.9 %
Platelet: 231 10*3/uL (ref 150–440)
RBC: 4.69 10*6/uL (ref 3.80–5.20)
RDW: 14.1 % (ref 11.5–14.5)

## 2011-11-21 LAB — HEMOGLOBIN A1C: Hemoglobin A1C: 10.1 % — ABNORMAL HIGH (ref 4.2–6.3)

## 2011-11-21 NOTE — Telephone Encounter (Signed)
Her recent cbc and blood cultures were normal.

## 2011-11-21 NOTE — Telephone Encounter (Signed)
Patient notified of results.

## 2011-11-22 LAB — CBC WITH DIFFERENTIAL/PLATELET
Basophil #: 0 10*3/uL (ref 0.0–0.1)
Eosinophil #: 0 10*3/uL (ref 0.0–0.7)
Eosinophil %: 0 %
Lymphocyte #: 0.7 10*3/uL — ABNORMAL LOW (ref 1.0–3.6)
Lymphocyte %: 14.4 %
MCH: 29 pg (ref 26.0–34.0)
MCHC: 33.4 g/dL (ref 32.0–36.0)
Monocyte #: 0.1 10*3/uL (ref 0.0–0.7)
Neutrophil #: 3.8 10*3/uL (ref 1.4–6.5)
Neutrophil %: 82.2 %
Platelet: 223 10*3/uL (ref 150–440)
RDW: 14.2 % (ref 11.5–14.5)

## 2011-11-23 LAB — BASIC METABOLIC PANEL
Anion Gap: 12 (ref 7–16)
Chloride: 100 mmol/L (ref 98–107)
Co2: 26 mmol/L (ref 21–32)
Creatinine: 0.81 mg/dL (ref 0.60–1.30)
Glucose: 290 mg/dL — ABNORMAL HIGH (ref 65–99)
Osmolality: 288 (ref 275–301)
Potassium: 4.6 mmol/L (ref 3.5–5.1)
Sodium: 138 mmol/L (ref 136–145)

## 2011-11-27 ENCOUNTER — Inpatient Hospital Stay: Payer: Self-pay | Admitting: Internal Medicine

## 2011-11-27 ENCOUNTER — Ambulatory Visit (INDEPENDENT_AMBULATORY_CARE_PROVIDER_SITE_OTHER): Payer: PRIVATE HEALTH INSURANCE | Admitting: Internal Medicine

## 2011-11-27 ENCOUNTER — Encounter: Payer: Self-pay | Admitting: Internal Medicine

## 2011-11-27 VITALS — BP 86/60 | HR 115 | Temp 97.5°F | Resp 16 | Wt 189.0 lb

## 2011-11-27 DIAGNOSIS — R197 Diarrhea, unspecified: Secondary | ICD-10-CM

## 2011-11-27 DIAGNOSIS — R112 Nausea with vomiting, unspecified: Secondary | ICD-10-CM

## 2011-11-27 LAB — CBC WITH DIFFERENTIAL/PLATELET
Eosinophil %: 0.4 %
HGB: 15.4 g/dL (ref 12.0–16.0)
MCH: 29.2 pg (ref 26.0–34.0)
MCHC: 33.5 g/dL (ref 32.0–36.0)
MCV: 87 fL (ref 80–100)
Monocyte %: 7.5 %
Neutrophil #: 7.8 10*3/uL — ABNORMAL HIGH (ref 1.4–6.5)
Neutrophil %: 75.1 %
Platelet: 315 10*3/uL (ref 150–440)
RDW: 14.1 % (ref 11.5–14.5)
WBC: 10.3 10*3/uL (ref 3.6–11.0)

## 2011-11-27 LAB — URINALYSIS, COMPLETE
Leukocyte Esterase: NEGATIVE
Nitrite: NEGATIVE
Ph: 6 (ref 4.5–8.0)
RBC,UR: 508 /HPF (ref 0–5)

## 2011-11-27 LAB — COMPREHENSIVE METABOLIC PANEL
Albumin: 3.3 g/dL — ABNORMAL LOW (ref 3.4–5.0)
Anion Gap: 14 (ref 7–16)
BUN: 24 mg/dL — ABNORMAL HIGH (ref 7–18)
Calcium, Total: 9.4 mg/dL (ref 8.5–10.1)
Chloride: 96 mmol/L — ABNORMAL LOW (ref 98–107)
Co2: 25 mmol/L (ref 21–32)
EGFR (African American): >60
EGFR (Non-African Amer.): >60
SGOT(AST): 59 U/L — ABNORMAL HIGH (ref 15–37)
SGPT (ALT): 41 U/L

## 2011-11-27 LAB — CULTURE, BLOOD (SINGLE)

## 2011-11-27 NOTE — Progress Notes (Signed)
Subjective:    Patient ID: [name], female    DOB: 04-25-1971, 41 y.o.   MRN: [id number]  HPI  41 yr old white female with history of uncontrolled DM, recently Discharged from [facility] on March 8 for dehdyration secondary to severe pharyngitis  Presents today for hospital followup.   Has been vomiting repeatedly and having 3 to 6 loose stools daily since Sunday.  Unable to keep down even liquids without vomiting.  In office she is weak,  tachycardic, orthostatic.  She was not vomiting when she was discharged home and was taking limited po as her pharyngitis had improved.  She was discharged home on augmentin and prednisone taper , which she is still taking.  Cannot remain upright without feeling presyncopal and becoming nauseated,  Followed by dry heaving.  She has lost 17 lbs since last visit. No relief with promethazine .  Past Medical History  Diagnosis Date  . Diabetes mellitus   . allergic rhinitis   . Depression   . GERD (gastroesophageal reflux disease)   . History of migraine headaches   . tobacco abuse    Current Outpatient Prescriptions on File Prior to Visit  Medication Sig Dispense Refill  . ALPRAZolam (XANAX) 0.5 MG tablet Take 1 tablet (0.5 mg total) by mouth 2 (two) times daily as needed for sleep or anxiety.  60 tablet  2  . amoxicillin-clavulanate (AUGMENTIN) 875-125 MG per tablet Take 1 tablet by mouth 2 (two) times daily.        Marland Kitchen aspirin 81 MG tablet Take 81 mg by mouth daily.        . cycloSPORINE (RESTASIS) 0.05 % ophthalmic emulsion Place 1 drop into both eyes 2 (two) times daily.        Marland Kitchen desogestrel-ethinyl estradiol (RECLIPSEN) 0.15-30 MG-MCG tablet Take 1 tablet by mouth daily.  1 Package  11  . glucose blood test strip Use as instructed  120 each  12  . HYDROcodone-acetaminophen (NORCO) 10-325 MG per tablet Take 1 tablet by mouth every 8 (eight) hours as needed for pain.  30 tablet  0  . insulin aspart (NOVOLOG FLEXPEN) 100 UNIT/ML injection Inject 20 Units into the  skin 3 (three) times daily before meals.  10 pen  12  . insulin glargine (LANTUS) 100 UNIT/ML injection Inject 55 Units into the skin at bedtime.  30 mL  3  . Insulin Pen Needle 31G X 6 MM MISC Patient test blood sugar two times daily.  100 each  5  . Lancets MISC Patient test blood sugar two times daily.  100 each  5  . lisinopril (PRINIVIL,ZESTRIL) 5 MG tablet Take 5 mg by mouth daily.      . metFORMIN (FORTAMET) 1000 MG (OSM) 24 hr tablet Take 1 tablet (1,000 mg total) by mouth daily with breakfast.  60 tablet  11  . predniSONE (STERAPRED UNI-PAK) 10 MG tablet 6 tablets on Day 1 , then reduce by 1 tablet daily until gone  21 tablet  0  . ranitidine (ZANTAC) 150 MG tablet Take 150 mg by mouth 2 (two) times daily.        . sertraline (ZOLOFT) 100 MG tablet Take 1 tablet (100 mg total) by mouth daily.  30 tablet  4      Review of Systems  Constitutional: Positive for fever. Negative for chills and unexpected weight change.  HENT: Negative for hearing loss, ear pain, nosebleeds, congestion, sore throat, facial swelling, rhinorrhea, sneezing, mouth sores, trouble swallowing, neck  pain, neck stiffness, voice change, postnasal drip, sinus pressure, tinnitus and ear discharge.   Eyes: Negative for pain, discharge, redness and visual disturbance.  Respiratory: Negative for cough, chest tightness, shortness of breath, wheezing and stridor.   Cardiovascular: Negative for chest pain, palpitations and leg swelling.  Gastrointestinal: Positive for nausea, vomiting, diarrhea and constipation. Negative for rectal pain.  Musculoskeletal: Negative for myalgias and arthralgias.  Skin: Negative for color change and rash.  Neurological: Negative for dizziness, weakness, light-headedness and headaches.  Hematological: Negative for adenopathy.    BP 86/60  Pulse 115  Temp(Src) 97.5 F (36.4 C) (Oral)  Resp 16  Wt 189 lb (85.73 kg)  SpO2 96%  LMP 10/15/2011       Objective:   Physical Exam    Constitutional: She is oriented to person, place, and time. She appears well-developed and well-nourished. She appears distressed.  HENT:  Mouth/Throat: Oropharynx is clear and moist.  Eyes: EOM are normal. Pupils are equal, round, and reactive to light. No scleral icterus.  Neck: Normal range of motion. Neck supple. No JVD present. No thyromegaly present.  Cardiovascular: Normal rate, regular rhythm, normal heart sounds and intact distal pulses.   Pulmonary/Chest: Effort normal and breath sounds normal.  Abdominal: Soft. Bowel sounds are normal. She exhibits no mass. There is tenderness. There is no rebound and no guarding.  Musculoskeletal: Normal range of motion. She exhibits no edema.  Lymphadenopathy:    She has no cervical adenopathy.  Neurological: She is alert and oriented to person, place, and time.  Skin: Skin is warm. She is diaphoretic. There is pallor.  Psychiatric: She has a normal mood and affect.      Assessment & Plan:   Dehydration:  Secondary to protracted nausea/vomiting accompanied by profuse diarrhea..  Etiology may be gastritisis secondary to prednisone, antibiotics ,  Plus/minus   C  dificile colitis,vs viral gastroenteritis.   She is quite orthostatic and will need admission for IV fluids, GI consult and workup to rule out C dificile given current use of antibiotics.   I have spoken with Dr. Dava Najjar and patient will be admitted directly from office to Hospitalist service.

## 2011-11-28 LAB — BASIC METABOLIC PANEL
Anion Gap: 12 (ref 7–16)
BUN: 17 mg/dL (ref 7–18)
Calcium, Total: 8.3 mg/dL — ABNORMAL LOW (ref 8.5–10.1)
Chloride: 101 mmol/L (ref 98–107)
Co2: 22 mmol/L (ref 21–32)
EGFR (Non-African Amer.): >60
Glucose: 231 mg/dL — ABNORMAL HIGH (ref 65–99)
Osmolality: 279 (ref 275–301)
Potassium: 4.4 mmol/L (ref 3.5–5.1)
Sodium: 135 mmol/L — ABNORMAL LOW (ref 136–145)

## 2011-11-28 LAB — WBCS, STOOL

## 2011-11-28 LAB — CLOSTRIDIUM DIFFICILE BY PCR

## 2011-11-29 LAB — BASIC METABOLIC PANEL
Anion Gap: 10 (ref 7–16)
Calcium, Total: 8.4 mg/dL — ABNORMAL LOW (ref 8.5–10.1)
Chloride: 102 mmol/L (ref 98–107)
Co2: 25 mmol/L (ref 21–32)
Creatinine: 0.64 mg/dL (ref 0.60–1.30)
EGFR (African American): >60
Glucose: 205 mg/dL — ABNORMAL HIGH (ref 65–99)

## 2011-11-30 LAB — CBC WITH DIFFERENTIAL/PLATELET
Basophil #: 0 10*3/uL (ref 0.0–0.1)
Basophil %: 0.5 %
Eosinophil #: 0.2 10*3/uL (ref 0.0–0.7)
Lymphocyte #: 2.4 10*3/uL (ref 1.0–3.6)
MCH: 29 pg (ref 26.0–34.0)
MCHC: 32.8 g/dL (ref 32.0–36.0)
Monocyte #: 0.6 10*3/uL (ref 0.0–0.7)
Neutrophil %: 54.3 %
Platelet: 283 10*3/uL (ref 150–440)
RDW: 13.9 % (ref 11.5–14.5)

## 2011-11-30 LAB — BASIC METABOLIC PANEL
Anion Gap: 14 (ref 7–16)
BUN: 11 mg/dL (ref 7–18)
Calcium, Total: 8.7 mg/dL (ref 8.5–10.1)
Co2: 22 mmol/L (ref 21–32)
EGFR (African American): >60
Glucose: 268 mg/dL — ABNORMAL HIGH (ref 65–99)
Osmolality: 279 (ref 275–301)

## 2011-11-30 LAB — STOOL CULTURE

## 2011-12-03 ENCOUNTER — Encounter: Payer: Self-pay | Admitting: Internal Medicine

## 2011-12-03 ENCOUNTER — Ambulatory Visit (INDEPENDENT_AMBULATORY_CARE_PROVIDER_SITE_OTHER): Payer: PRIVATE HEALTH INSURANCE | Admitting: Internal Medicine

## 2011-12-03 VITALS — BP 112/78 | HR 112 | Temp 98.6°F | Wt 190.0 lb

## 2011-12-03 DIAGNOSIS — K529 Noninfective gastroenteritis and colitis, unspecified: Secondary | ICD-10-CM | POA: Insufficient documentation

## 2011-12-03 DIAGNOSIS — F419 Anxiety disorder, unspecified: Secondary | ICD-10-CM

## 2011-12-03 DIAGNOSIS — E1165 Type 2 diabetes mellitus with hyperglycemia: Secondary | ICD-10-CM

## 2011-12-03 DIAGNOSIS — J029 Acute pharyngitis, unspecified: Secondary | ICD-10-CM

## 2011-12-03 DIAGNOSIS — K5289 Other specified noninfective gastroenteritis and colitis: Secondary | ICD-10-CM

## 2011-12-03 MED ORDER — ALPRAZOLAM 0.5 MG PO TABS: 0.5000 mg | ORAL_TABLET | Freq: Two times a day (BID) | ORAL | Status: DC | PRN

## 2011-12-03 NOTE — Assessment & Plan Note (Signed)
With recent admission for several day for severe dehydration. All stool cultures were negative,  And she was discharged after several days in good condition.

## 2011-12-03 NOTE — Assessment & Plan Note (Signed)
Now completely resolved.

## 2011-12-03 NOTE — Progress Notes (Signed)
Patient ID: [name], female   DOB: 08/31/1971, 41 y.o.   MRN: [id number]  Patient Active Problem List  Diagnoses  . Diabetes mellitus type 2, uncontrolled  . Screening for cervical cancer  . Depression  . GERD (gastroesophageal reflux disease)  . History of migraine headaches  . Menometrorrhagia  . Dermoid cyst of arm  . Numbness and tingling of right arm and leg  . Pharyngitis  . Gastroenteritis    Subjective:  CC:   Chief Complaint  Patient presents with  . hospital follow up    HPI:   [name] a 41 y.o. female who presents for hospital followup after recent admission for dehydraton secondary to gastroenteritis.  Her symptoms have resolved.  She is stooling regularly and able to eat without vomiting. she is tolerating a modified diet but has not returned to work due to weakness and fatigue. 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 Diabetes mellitus   . allergic rhinitis   . Depression   . GERD (gastroesophageal reflux disease)   . History of migraine headaches   . tobacco abuse     Past Surgical History  Procedure Date  . Tubal ligation   . Cesarean section     x 2,  breech, premature 7 wks         The following portions of the patient's history were reviewed and updated as appropriate: Allergies, current medications, and problem list.    Review of Systems:   12 Pt  review of systems was negative except those addressed in the HPI,     History   Social History  . Marital Status: Single    Spouse Name: N/A    Number of Children: N/A  . Years of Education: N/A   Occupational History  . Not on file.   Social History Main Topics  . Smoking status: Former Smoker    Quit date: 07/17/2010  . Smokeless tobacco: Never Used  . Alcohol Use: No  . Drug Use: No  . Sexually Active: Not on file   Other Topics Concern  .  Not on file   Social History Narrative  . No narrative on file    Objective:  BP 112/78  Pulse 112  Temp(Src) 98.6 F (37 C) (Oral)  Wt 190 lb (86.183 kg)  SpO2 97%  LMP 10/15/2011  General appearance: alert, cooperative and appears stated age Ears: normal TM's and external ear canals both ears Throat: lips, mucosa, and tongue normal; teeth and gums normal Neck: no adenopathy, no carotid bruit, supple, symmetrical, trachea midline and thyroid not enlarged, symmetric, no tenderness/mass/nodules Back: symmetric, no curvature. ROM normal. No CVA tenderness. Lungs: clear to auscultation bilaterally Heart: regular rate and rhythm, S1, S2 normal, no murmur, click, rub or gallop Abdomen: soft, non-tender; bowel sounds normal; no masses,  no organomegaly Pulses: 2+ and symmetric Skin: Skin color, texture, turgor normal. No rashes or lesions Lymph nodes: Cervical, supraclavicular, and axillary nodes normal.  Assessment and Plan:  Pharyngitis Now completely resolved.   Diabetes mellitus type 2, uncontrolled Improved with  Recent weight loss and use of protein shakes and low glycemic index diet.  No changes to regimen today.  Gastroenteritis With recent admission for several day for severe dehydration. All stool cultures were negative,  And she was discharged after several days  in good condition.     Updated Medication List Outpatient Encounter Prescriptions as of 12/03/2011  Medication Sig Dispense Refill  . ALPRAZolam (XANAX) 0.5 MG tablet Take 1 tablet (0.5 mg total) by mouth 2 (two) times daily as needed for sleep or anxiety.  60 tablet  2  . aspirin 81 MG tablet Take 81 mg by mouth daily.        . Butalbital-Acetaminophen 50-300 MG TABS Take by mouth daily as needed.      . cycloSPORINE (RESTASIS) 0.05 % ophthalmic emulsion Place 1 drop into both eyes 2 (two) times daily.        Marland Kitchen desogestrel-ethinyl estradiol (RECLIPSEN) 0.15-30 MG-MCG tablet Take 1 tablet by mouth daily.  1  Package  11  . glucose blood test strip Use as instructed  120 each  12  . HYDROcodone-acetaminophen (NORCO) 10-325 MG per tablet Take 1 tablet by mouth daily as needed.      . insulin aspart (NOVOLOG FLEXPEN) 100 UNIT/ML injection Inject 20 Units into the skin 3 (three) times daily before meals.  10 pen  12  . insulin glargine (LANTUS) 100 UNIT/ML injection Inject 55 Units into the skin at bedtime.  30 mL  3  . Insulin Pen Needle 31G X 6 MM MISC Patient test blood sugar two times daily.  100 each  5  . Lancets MISC Patient test blood sugar two times daily.  100 each  5  . lisinopril (PRINIVIL,ZESTRIL) 5 MG tablet Take 5 mg by mouth daily.      . metFORMIN (FORTAMET) 1000 MG (OSM) 24 hr tablet Take 1 tablet (1,000 mg total) by mouth daily with breakfast.  60 tablet  11  . promethazine (PHENERGAN) 25 MG tablet Take 25 mg by mouth daily as needed.      . ranitidine (ZANTAC) 150 MG tablet Take 150 mg by mouth 2 (two) times daily.        . sertraline (ZOLOFT) 100 MG tablet Take 1 tablet (100 mg total) by mouth daily.  30 tablet  4  . DISCONTD: ALPRAZolam (XANAX) 0.5 MG tablet Take 1 tablet (0.5 mg total) by mouth 2 (two) times daily as needed for sleep or anxiety.  60 tablet  2  . DISCONTD: amoxicillin-clavulanate (AUGMENTIN) 875-125 MG per tablet Take 1 tablet by mouth 2 (two) times daily.           No orders of the defined types were placed in this encounter.    No Follow-up on file.

## 2011-12-03 NOTE — Assessment & Plan Note (Signed)
Improved with  Recent weight loss and use of protein shakes and low glycemic index diet.  No changes to regimen today.

## 2011-12-04 ENCOUNTER — Other Ambulatory Visit: Payer: Self-pay | Admitting: *Deleted

## 2011-12-04 ENCOUNTER — Encounter: Payer: Self-pay | Admitting: Internal Medicine

## 2011-12-04 MED ORDER — INSULIN PEN NEEDLE 31G X 6 MM MISC: Status: DC

## 2011-12-10 ENCOUNTER — Telehealth: Payer: Self-pay | Admitting: Internal Medicine

## 2011-12-10 MED ORDER — FLUCONAZOLE 150 MG PO TABS: 150.0000 mg | ORAL_TABLET | Freq: Every day | ORAL | Status: AC

## 2011-12-10 NOTE — Telephone Encounter (Signed)
I called patient she stated since being on antibiotics she has now developed a yeast infection.  She stated she has vaginal itching and a fair amount of discharge.  Please advise.

## 2011-12-10 NOTE — Telephone Encounter (Signed)
Patient is needing a prescription for Diflucan for yeast infection.

## 2011-12-10 NOTE — Telephone Encounter (Signed)
You can call her in fluconazole 150 mg one tablet daily for 2 days  #2 no refills.

## 2011-12-10 NOTE — Telephone Encounter (Signed)
Rx has been called in.  Patient notified. 

## 2011-12-27 ENCOUNTER — Ambulatory Visit: Payer: PRIVATE HEALTH INSURANCE | Admitting: Internal Medicine

## 2012-01-18 ENCOUNTER — Other Ambulatory Visit: Payer: Self-pay | Admitting: Internal Medicine

## 2012-01-18 DIAGNOSIS — F419 Anxiety disorder, unspecified: Secondary | ICD-10-CM

## 2012-01-18 MED ORDER — ALPRAZOLAM 0.5 MG PO TABS: 0.5000 mg | ORAL_TABLET | Freq: Two times a day (BID) | ORAL | Status: DC | PRN

## 2012-01-18 NOTE — Telephone Encounter (Signed)
Addended by: Darletta Moll A on: 01/18/2012 04:16 PM   Modules accepted: Orders

## 2012-01-18 NOTE — Telephone Encounter (Signed)
Refill on Alprazolam 0.5 mg today and sent to [address]

## 2012-03-25 ENCOUNTER — Telehealth: Payer: Self-pay | Admitting: Internal Medicine

## 2012-03-25 DIAGNOSIS — N760 Acute vaginitis: Secondary | ICD-10-CM

## 2012-03-25 NOTE — Telephone Encounter (Signed)
Refill on metformin 1000 mg OSM 24 hr. Tab, Sertraline 100 mg and something for a yeast infection . Patient does not have insurance anymore and can't afford her insuline.

## 2012-03-26 MED ORDER — SERTRALINE HCL 100 MG PO TABS: 100.0000 mg | ORAL_TABLET | Freq: Every day | ORAL | Status: DC

## 2012-03-26 MED ORDER — METFORMIN HCL ER (OSM) 1000 MG PO TB24: 1000.0000 mg | ORAL_TABLET | Freq: Every day | ORAL | Status: DC

## 2012-03-26 MED ORDER — FLUCONAZOLE 150 MG PO TABS: 150.0000 mg | ORAL_TABLET | Freq: Every day | ORAL | Status: AC

## 2012-03-26 NOTE — Telephone Encounter (Signed)
Patient is also asking if she can get something called in for a yeast infection.

## 2012-03-26 NOTE — Telephone Encounter (Signed)
pleaes tell patient to come by for samples of insulin ,  She uses the short acting and we have plenty of it to give her 3 months worth. Novolog pens

## 2012-03-27 NOTE — Telephone Encounter (Signed)
Tried calling patient, but got no answer and there was not way to leave a message. Will try calling again later.

## 2012-03-28 MED ORDER — METFORMIN HCL 500 MG PO TABS: 500.0000 mg | ORAL_TABLET | Freq: Two times a day (BID) | ORAL | Status: AC

## 2012-03-28 NOTE — Telephone Encounter (Signed)
Patient notified. She will come by to pick up the samples.

## 2012-04-14 ENCOUNTER — Encounter: Payer: Self-pay | Admitting: Internal Medicine

## 2012-05-27 ENCOUNTER — Telehealth: Payer: Self-pay | Admitting: Internal Medicine

## 2012-05-27 NOTE — Telephone Encounter (Signed)
Patient notified. She is going to UC.

## 2012-05-27 NOTE — Telephone Encounter (Signed)
Patient calling, has right chest /rib pain that radiates into her back.  Has had a cold and cough.  No fever.  States that the pain was so bad last night that she had to take a Vicodin for relief.   LMP 9/4. She did not check her FBS this am, was late getting up and had to be at work.  RN override to have her seen today due to diabetes.  No appts. avaiable.  Please call her to schedule a work in appt.

## 2012-05-27 NOTE — Telephone Encounter (Signed)
I do not understand  what her blood sugar has to do with her chest pain. If she is having chest pain and there is no appt available she will need to go to ER. If it is her blood sugar that is an issue . She can get her BS checked at [facility]

## 2012-06-24 ENCOUNTER — Telehealth: Payer: Self-pay | Admitting: Internal Medicine

## 2012-06-24 NOTE — Telephone Encounter (Signed)
Pt called to see if dr Darrick Huntsman would call her in something for yeast infection [facility] [address]

## 2012-06-24 NOTE — Telephone Encounter (Signed)
fluconazole 150 mg one tablet daily  #2   No refills,  If symptoms persist maker appt for pelvic

## 2012-06-25 MED ORDER — FLUCONAZOLE 150 MG PO TABS: 150.0000 mg | ORAL_TABLET | Freq: Every day | ORAL | Status: DC

## 2012-06-25 NOTE — Telephone Encounter (Signed)
Patient advised as instructed via telephone, Rx sent to [facility] 

## 2012-06-25 NOTE — Telephone Encounter (Signed)
Pt wanted to know if this was called in.  Please call pt when this is done  [phone]

## 2012-08-28 ENCOUNTER — Other Ambulatory Visit: Payer: Self-pay | Admitting: Internal Medicine

## 2012-08-28 ENCOUNTER — Other Ambulatory Visit: Payer: Self-pay

## 2012-08-28 DIAGNOSIS — F419 Anxiety disorder, unspecified: Secondary | ICD-10-CM

## 2012-08-28 NOTE — Telephone Encounter (Signed)
Refill request for Xanax 0.5 mg. Ok to refill? 

## 2012-08-29 MED ORDER — ALPRAZOLAM 0.5 MG PO TABS: 0.5000 mg | ORAL_TABLET | Freq: Two times a day (BID) | ORAL | Status: DC | PRN

## 2012-08-29 NOTE — Telephone Encounter (Signed)
Xanax 0.5 mg phoned in to [facility].

## 2012-11-06 ENCOUNTER — Other Ambulatory Visit: Payer: Self-pay | Admitting: Internal Medicine

## 2012-11-06 NOTE — Telephone Encounter (Signed)
Pt has not been seen since 12/03/11. Please advise.

## 2012-12-23 ENCOUNTER — Other Ambulatory Visit: Payer: Self-pay | Admitting: Internal Medicine

## 2013-04-06 ENCOUNTER — Other Ambulatory Visit: Payer: Self-pay | Admitting: Internal Medicine

## 2013-04-06 DIAGNOSIS — F419 Anxiety disorder, unspecified: Secondary | ICD-10-CM

## 2013-04-06 NOTE — Telephone Encounter (Signed)
Last visit 12/03/11

## 2013-04-06 NOTE — Telephone Encounter (Signed)
Pt called checking on her generic xanax Pt stated [facility] [address] has sent refill request Please advise Pt is completely out of med she has been out for 2 weeks

## 2013-04-07 NOTE — Telephone Encounter (Signed)
No refills until she is seen .  And she needs to come in first for fasitng labs  So the visit is more productiveShe is a diabetic and has not been seen in over one year.!!!!  We need a way to in EPIC to make sure patients with diabetes  Are seen and have labs every 3 months. Has this been addressed in any of your meetings yet?    Im not even sure if both parties are receiving this message ,  Can you both let me know ?

## 2013-04-07 NOTE — Telephone Encounter (Signed)
See prior misrouted note

## 2013-04-07 NOTE — Telephone Encounter (Signed)
Advised patient as instructed.  She asks how much an office visit cost, she has no insurance.  Call transferred to front desk.  Refill denied to [facility].

## 2013-04-09 ENCOUNTER — Telehealth: Payer: Self-pay | Admitting: Internal Medicine

## 2013-04-09 ENCOUNTER — Other Ambulatory Visit: Payer: Self-pay | Admitting: *Deleted

## 2013-04-09 ENCOUNTER — Encounter: Payer: Self-pay | Admitting: *Deleted

## 2013-04-09 DIAGNOSIS — F419 Anxiety disorder, unspecified: Secondary | ICD-10-CM

## 2013-04-09 NOTE — Telephone Encounter (Signed)
Appointment has been scheduled by front desk, 04/14/13.

## 2013-04-09 NOTE — Telephone Encounter (Signed)
Pt returned call.  Appt made for pt 7/29 @ 1:30.

## 2013-04-09 NOTE — Telephone Encounter (Signed)
Tried to reach pt by phone-home phone disconnected & mailbox full on cellphone. Mailed letter to patient with Dr. Melina Schools response.

## 2013-04-09 NOTE — Telephone Encounter (Signed)
I have already refused this once,  No refills until she is seen

## 2013-04-09 NOTE — Telephone Encounter (Signed)
Not only is the refill denied.  She is a diabetic and has not been seen in over a year.  She needs fasting labs, etc per first message ,  She needs fasting labs prior to appt

## 2013-04-09 NOTE — Telephone Encounter (Signed)
Last visit 11/27/11, refill 1 month or need visit first?

## 2013-04-13 ENCOUNTER — Other Ambulatory Visit (INDEPENDENT_AMBULATORY_CARE_PROVIDER_SITE_OTHER): Payer: Self-pay

## 2013-04-13 ENCOUNTER — Other Ambulatory Visit: Payer: PRIVATE HEALTH INSURANCE

## 2013-04-13 DIAGNOSIS — IMO0001 Reserved for inherently not codable concepts without codable children: Secondary | ICD-10-CM

## 2013-04-13 LAB — MICROALBUMIN / CREATININE URINE RATIO
Creatinine,U: 207.6 mg/dL
Microalb, Ur: 4.3 mg/dL — ABNORMAL HIGH (ref 0.0–1.9)

## 2013-04-13 LAB — COMPREHENSIVE METABOLIC PANEL
ALT: 30 U/L (ref 0–35)
Albumin: 3.4 g/dL — ABNORMAL LOW (ref 3.5–5.2)
CO2: 28 mEq/L (ref 19–32)
Calcium: 9.2 mg/dL (ref 8.4–10.5)
Chloride: 101 mEq/L (ref 96–112)
GFR: 107.19 mL/min (ref >60.00)
Glucose, Bld: 184 mg/dL — ABNORMAL HIGH (ref 70–99)
Potassium: 4.5 mEq/L (ref 3.5–5.1)
Sodium: 137 mEq/L (ref 135–145)
Total Protein: 7.1 g/dL (ref 6.0–8.3)

## 2013-04-13 LAB — LIPID PANEL
HDL: 44.3 mg/dL (ref >39.00)
Total CHOL/HDL Ratio: 4

## 2013-04-14 ENCOUNTER — Ambulatory Visit (INDEPENDENT_AMBULATORY_CARE_PROVIDER_SITE_OTHER): Payer: Self-pay | Admitting: Internal Medicine

## 2013-04-14 ENCOUNTER — Encounter: Payer: Self-pay | Admitting: Internal Medicine

## 2013-04-14 VITALS — BP 100/68 | HR 64 | Temp 98.2°F | Wt 191.0 lb

## 2013-04-14 DIAGNOSIS — F419 Anxiety disorder, unspecified: Secondary | ICD-10-CM

## 2013-04-14 DIAGNOSIS — F329 Major depressive disorder, single episode, unspecified: Secondary | ICD-10-CM

## 2013-04-14 DIAGNOSIS — E1165 Type 2 diabetes mellitus with hyperglycemia: Secondary | ICD-10-CM

## 2013-04-14 DIAGNOSIS — F411 Generalized anxiety disorder: Secondary | ICD-10-CM

## 2013-04-14 MED ORDER — GLUCOSE BLOOD VI STRP: ORAL_STRIP | Status: AC

## 2013-04-14 MED ORDER — ALPRAZOLAM 0.5 MG PO TABS: 0.5000 mg | ORAL_TABLET | Freq: Every evening | ORAL | Status: DC | PRN

## 2013-04-14 NOTE — Patient Instructions (Addendum)
Your diet is going to have to change:    You need to eat protein for breakfast.  2 hard boiled eggs and a slice of cheese is a great hi protein breakfast Try eating a Dannon light n Fit Greek yogurt for lunch.  Food [name] has good prices and [employer].  Add walnuts or pecans to it  Dinner:   try to choose unbreaded entrees. And skip the potato and rice .     For your insulin,  use the samples of Levemir and Humalog I have given your Start with 15 units of humalog before each meal for bs > 200.   20 uits if > 250.  25 units if > 300 Use 10 units if 150 or less unless the meal is pure protien, then use only 5    Take 25 units of levemir once daily .  Call a week before you run out!!

## 2013-04-14 NOTE — Progress Notes (Signed)
Patient ID: [name], female   DOB: 02/28/1971, 42 y.o.   MRN: [id number]   Patient Active Problem List   Diagnosis Date Noted  . Gastroenteritis 12/03/2011  . Pharyngitis 11/19/2011  . Numbness and tingling of right arm and leg 09/18/2011  . Menometrorrhagia 08/21/2011  . Depression   . GERD (gastroesophageal reflux disease)   . History of migraine headaches   . Screening for cervical cancer 07/18/2011  . Diabetes mellitus type 2, uncontrolled     Subjective:  CC:   Chief Complaint  Patient presents with  . Follow-up    Medication refills and follow up on diabetes    HPI:   [name] a 42 y.o. female who presents Followup on uncontrolled diabetes, insulin requiring, and generalized anxiety disorder. Patient has not been seen in over a year. She was hospitalized last March and afterwards was fired by the hospital. She has lost her insurance and has not filed for [facility] care yet.  She is working 2 part-time jobs , which do not  total 40 hours per week and trying to support her 2 teenage children. She has been borrowing insulin from friends this rather than asking [place] for samples.  She has not using insulin regularly, ,  She has had a blood sugar of  500 at bedtime On more than one occasion.  Her diet was reviewed today. She is now following a low glycemic index diet typically she eats a biscuit at work for breakfast,  Skips lunch,   and eats "Something fried " for dinner.  Anxiety disorder:  She is using alprazolam for insomnia and sertraline both 19/month at [employer],     Past Medical History  Diagnosis Date  . Diabetes mellitus   . allergic rhinitis   . Depression   . GERD (gastroesophageal reflux disease)   . History of migraine headaches   . tobacco abuse     Past Surgical History  Procedure Laterality Date  . Tubal ligation    . Cesarean section      x 2,  breech, premature 7 wks       The following portions of the patient's history were reviewed and  updated as appropriate: Allergies, current medications, and problem list.    Review of Systems:   12 Pt  review of systems was negative except those addressed in the HPI,     History   Social History  . Marital Status: Single    Spouse Name: N/A    Number of Children: N/A  . Years of Education: N/A   Occupational History  . Not on file.   Social History Main Topics  . Smoking status: Former Smoker    Quit date: 07/17/2010  . Smokeless tobacco: Never Used  . Alcohol Use: No  . Drug Use: No  . Sexually Active: Not on file   Other Topics Concern  . Not on file   Social History Narrative  . No narrative on file    Objective:  BP 100/68  Pulse 64  Temp(Src) 98.2 F (36.8 C) (Oral)  Wt 191 lb (86.637 kg)  BMI 32.77 kg/m2  SpO2 97%  General appearance: alert, cooperative and appears stated age Ears: normal TM's and external ear canals both ears Throat: lips, mucosa, and tongue normal; teeth and gums normal Neck: no adenopathy, no carotid bruit, supple, symmetrical, trachea midline and thyroid not enlarged, symmetric, no tenderness/mass/nodules Back: symmetric, no curvature. ROM normal. No CVA tenderness. Lungs: clear to auscultation bilaterally Heart:  regular rate and rhythm, S1, S2 normal, no murmur, click, rub or gallop Abdomen: soft, non-tender; bowel sounds normal; no masses,  no organomegaly Pulses: 2+ and symmetric Skin: Skin color, texture, turgor normal. No rashes or lesions Lymph nodes: Cervical, supraclavicular, and axillary nodes normal. Foot exam:  Nails are well trimmed,  No callouses,  Sensation intact to microfilament  Assessment and Plan:  Diabetes mellitus type 2, uncontrolled Patient was given samples of Levemir pen and NovoLog flex pens to last her for the next 2 months. She was instructed to use a sliding scale of 15-20 units per meal depending on her carbohydrate load. She is instructed to use 25 units of Levemir  daily.  Depression complicated by stressors are unemployment and single parent status. Continue Zoloft and alprazolam. Refills given.   Updated Medication List Outpatient Encounter Prescriptions as of 04/14/2013  Medication Sig Dispense Refill  . ALPRAZolam (XANAX) 0.5 MG tablet Take 1 tablet (0.5 mg total) by mouth at bedtime as needed for sleep or anxiety.  90 tablet  1  . insulin aspart (NOVOLOG FLEXPEN) 100 UNIT/ML injection Inject 20 Units into the skin 3 (three) times daily before meals.  10 pen  12  . Insulin Pen Needle 31G X 6 MM MISC Patient test blood sugar two times daily.  100 each  5  . Lancets MISC Patient test blood sugar two times daily.  100 each  5  . ranitidine (ZANTAC) 150 MG tablet Take 150 mg by mouth 2 (two) times daily.        . sertraline (ZOLOFT) 100 MG tablet TAKE ONE TABLET BY MOUTH ONCE DAILY  30 tablet  2  . [DISCONTINUED] ALPRAZolam (XANAX) 0.5 MG tablet Take 1 tablet (0.5 mg total) by mouth 2 (two) times daily as needed for sleep or anxiety.  60 tablet  2  . [DISCONTINUED] insulin aspart (NOVOLOG FLEXPEN) 100 UNIT/ML injection Inject 20 Units into the skin 3 (three) times daily before meals.  10 pen  12  . [DISCONTINUED] insulin glargine (LANTUS) 100 UNIT/ML injection Inject 55 Units into the skin at bedtime.  30 mL  3  . aspirin 81 MG tablet Take 81 mg by mouth daily.        . Butalbital-Acetaminophen 50-300 MG TABS Take by mouth daily as needed.      . cycloSPORINE (RESTASIS) 0.05 % ophthalmic emulsion Place 1 drop into both eyes 2 (two) times daily.        Marland Kitchen desogestrel-ethinyl estradiol (RECLIPSEN) 0.15-30 MG-MCG tablet Take 1 tablet by mouth daily.  1 Package  11  . fluconazole (DIFLUCAN) 150 MG tablet Take 1 tablet (150 mg total) by mouth daily.  2 tablet  0  . glucose blood test strip Use as instructed up to 4 times daily  100 each  12  . HYDROcodone-acetaminophen (NORCO) 10-325 MG per tablet Take 1 tablet by mouth daily as needed.      . insulin aspart  (NOVOLOG) 100 UNIT/ML injection Inject 20 Units into the skin 3 (three) times daily before meals.  3 pen  PRN  . insulin detemir (LEVEMIR) 100 UNIT/ML injection Inject 0.25 mLs (25 Units total) into the skin at bedtime.  3 mL  3  . lisinopril (PRINIVIL,ZESTRIL) 5 MG tablet Take 5 mg by mouth daily.      . promethazine (PHENERGAN) 25 MG tablet Take 25 mg by mouth daily as needed.       No facility-administered encounter medications on file as of 04/14/2013.  Orders Placed This Encounter  Procedures  . HM PAP SMEAR    No Follow-up on file.

## 2013-04-15 ENCOUNTER — Encounter: Payer: Self-pay | Admitting: Internal Medicine

## 2013-04-15 MED ORDER — INSULIN ASPART 100 UNIT/ML ~~LOC~~ SOLN: 20.0000 [IU] | Freq: Three times a day (TID) | SUBCUTANEOUS | Status: DC

## 2013-04-15 MED ORDER — INSULIN DETEMIR 100 UNIT/ML ~~LOC~~ SOLN: 25.0000 [IU] | Freq: Every day | SUBCUTANEOUS | Status: DC

## 2013-04-15 NOTE — Assessment & Plan Note (Addendum)
Patient was given samples of Levemir pen and NovoLog flex pens to last her for the next 2 months. She was instructed to use a sliding scale of 15-20 units per meal depending on her carbohydrate load. She is instructed to use 25 units of Levemir daily.

## 2013-04-15 NOTE — Assessment & Plan Note (Signed)
complicated by stressors are unemployment and single parent status. Continue Zoloft and alprazolam. Refills given.

## 2013-05-12 ENCOUNTER — Other Ambulatory Visit: Payer: Self-pay | Admitting: Internal Medicine

## 2013-06-18 ENCOUNTER — Emergency Department: Payer: Self-pay | Admitting: Emergency Medicine

## 2013-06-18 LAB — COMPREHENSIVE METABOLIC PANEL
Albumin: 3.3 g/dL — ABNORMAL LOW (ref 3.4–5.0)
Alkaline Phosphatase: 148 U/L — ABNORMAL HIGH (ref 50–136)
Chloride: 93 mmol/L — ABNORMAL LOW (ref 98–107)
Creatinine: 0.91 mg/dL (ref 0.60–1.30)
Glucose: 557 mg/dL (ref 65–99)
Osmolality: 282 (ref 275–301)
Potassium: 4 mmol/L (ref 3.5–5.1)
SGOT(AST): 21 U/L (ref 15–37)
SGPT (ALT): 34 U/L (ref 12–78)
Sodium: 128 mmol/L — ABNORMAL LOW (ref 136–145)

## 2013-06-18 LAB — CBC
MCH: 28.8 pg (ref 26.0–34.0)
MCV: 85 fL (ref 80–100)
Platelet: 227 10*3/uL (ref 150–440)
RDW: 13.5 % (ref 11.5–14.5)
WBC: 6.6 10*3/uL (ref 3.6–11.0)

## 2013-06-18 LAB — TROPONIN I: Troponin-I: <0.02 ng/mL

## 2013-06-18 LAB — LIPASE, BLOOD: Lipase: 146 U/L (ref 73–393)

## 2013-06-24 ENCOUNTER — Encounter: Payer: Self-pay | Admitting: Adult Health

## 2013-06-24 ENCOUNTER — Ambulatory Visit (INDEPENDENT_AMBULATORY_CARE_PROVIDER_SITE_OTHER): Payer: Self-pay | Admitting: Adult Health

## 2013-06-24 VITALS — BP 110/66 | HR 70 | Temp 97.9°F | Resp 12 | Wt 187.0 lb

## 2013-06-24 DIAGNOSIS — R52 Pain, unspecified: Secondary | ICD-10-CM

## 2013-06-24 MED ORDER — TRAMADOL HCL 50 MG PO TABS: 50.0000 mg | ORAL_TABLET | Freq: Three times a day (TID) | ORAL | Status: DC | PRN

## 2013-06-24 MED ORDER — GABAPENTIN 300 MG PO CAPS: ORAL_CAPSULE | ORAL | Status: DC

## 2013-06-24 NOTE — Progress Notes (Signed)
  Subjective:    Patient ID: [name], female    DOB: Jun 27, 1971, 42 y.o.   MRN: [id number]  HPI  Patient is a pleasant 42 year old female who presents to clinic status post visit to the emergency room on 06/18/2013. She first went to urgent care at [facility] for pain on her right lateral side of chest. There was no rash. Urgent care felt that patient needed to be evaluated for cardiac. She was sent to the emergency room with negative findings. Patient presents to clinic still having sensitivity and pain on the right lateral side of her chest. Denies any rash at this time. She reports that she was discharged from the emergency room without any medication.   Current Outpatient Prescriptions on File Prior to Visit  Medication Sig Dispense Refill  . ALPRAZolam (XANAX) 0.5 MG tablet Take 1 tablet (0.5 mg total) by mouth at bedtime as needed for sleep or anxiety.  90 tablet  1  . glucose blood test strip Use as instructed up to 4 times daily  100 each  12  . insulin detemir (LEVEMIR) 100 UNIT/ML injection Inject 0.25 mLs (25 Units total) into the skin at bedtime.  3 mL  3  . Insulin Pen Needle 31G X 6 MM MISC Patient test blood sugar two times daily.  100 each  5  . Lancets MISC Patient test blood sugar two times daily.  100 each  5  . sertraline (ZOLOFT) 100 MG tablet TAKE ONE TABLET BY MOUTH ONCE DAILY  30 tablet  2  . insulin aspart (NOVOLOG FLEXPEN) 100 UNIT/ML injection Inject 20 Units into the skin 3 (three) times daily before meals.  10 pen  12  . insulin aspart (NOVOLOG) 100 UNIT/ML injection Inject 20 Units into the skin 3 (three) times daily before meals.  3 pen  PRN   No current facility-administered medications on file prior to visit.     Review of Systems  Constitutional: Negative.   Respiratory: Negative.   Cardiovascular: Negative.   Skin: Negative for rash.       Pain along right lateral side of chest down towards waist       Objective:   Physical Exam  Constitutional:  She is oriented to person, place, and time. No distress.  Appears uncomfortable  Cardiovascular: Normal rate and regular rhythm.   Pulmonary/Chest: Effort normal. No respiratory distress.  Neurological: She is alert and oriented to person, place, and time.  Skin: Skin is warm and dry.  There is an area of slightly greater pigmentation on the area where pt is exhibiting pain. No rash noted.    BP 110/66  Pulse 70  Temp(Src) 97.9 F (36.6 C) (Oral)  Resp 12  Wt 187 lb (84.823 kg)  BMI 32.08 kg/m2  SpO2 96%        Assessment & Plan:  g

## 2013-06-24 NOTE — Assessment & Plan Note (Signed)
On the right lateral side of chest down towards waist. There is a slight discoloration of her skin in this area without any notable rash.? Prodromal period of shingles. Instructed patient to report any breakout of sudden rash or vesicles. Will start her on some pain medication - short course of tramadol and Neurontin for neuropathic type pain. RTC if symptoms are not improved within one week.

## 2013-06-24 NOTE — Patient Instructions (Addendum)
Start tramadol 100 mg every 8 hours as needed for pain.  Neurontin (gabapentin) 300 mg on day #1 then 300 mg twice a day.  Shingles Shingles (herpes zoster) is an infection that is caused by the same virus that causes chickenpox (varicella). The infection causes a painful skin rash and fluid-filled blisters, which eventually break open, crust over, and heal. It may occur in any area of the body, but it usually affects only one side of the body or face. The pain of shingles usually lasts about 1 month. However, some people with shingles may develop long-term (chronic) pain in the affected area of the body. Shingles often occurs many years after the person had chickenpox. It is more common:  In people older than 50 years.  In people with weakened immune systems, such as those with HIV, AIDS, or cancer.  In people taking medicines that weaken the immune system, such as transplant medicines.  In people under great stress. CAUSES  Shingles is caused by the varicella zoster virus (VZV), which also causes chickenpox. After a person is infected with the virus, it can remain in the person's body for years in an inactive state (dormant). To cause shingles, the virus reactivates and breaks out as an infection in a nerve root. The virus can be spread from person to person (contagious) through contact with open blisters of the shingles rash. It will only spread to people who have not had chickenpox. When these people are exposed to the virus, they may develop chickenpox. They will not develop shingles. Once the blisters scab over, the person is no longer contagious and cannot spread the virus to others. SYMPTOMS  Shingles shows up in stages. The initial symptoms may be pain, itching, and tingling in an area of the skin. This pain is usually described as burning, stabbing, or throbbing.In a few days or weeks, a painful red rash will appear in the area where the pain, itching, and tingling were felt. The rash  is usually on one side of the body in a band or belt-like pattern. Then, the rash usually turns into fluid-filled blisters. They will scab over and dry up in approximately 2 3 weeks. Flu-like symptoms may also occur with the initial symptoms, the rash, or the blisters. These may include:  Fever.  Chills.  Headache.  Upset stomach. DIAGNOSIS  Your caregiver will perform a skin exam to diagnose shingles. Skin scrapings or fluid samples may also be taken from the blisters. This sample will be examined under a microscope or sent to a lab for further testing. TREATMENT  There is no specific cure for shingles. Your caregiver will likely prescribe medicines to help you manage the pain, recover faster, and avoid long-term problems. This may include antiviral drugs, anti-inflammatory drugs, and pain medicines. HOME CARE INSTRUCTIONS   Take a cool bath or apply cool compresses to the area of the rash or blisters as directed. This may help with the pain and itching.   Only take over-the-counter or prescription medicines as directed by your caregiver.   Rest as directed by your caregiver.  Keep your rash and blisters clean with mild soap and cool water or as directed by your caregiver.  Do not pick your blisters or scratch your rash. Apply an anti-itch cream or numbing creams to the affected area as directed by your caregiver.  Keep your shingles rash covered with a loose bandage (dressing).  Avoid skin contact with:  Babies.   Pregnant women.   Children with  eczema.   Elderly people with transplants.   People with chronic illnesses, such as leukemia or AIDS.   Wear loose-fitting clothing to help ease the pain of material rubbing against the rash.  Keep all follow-up appointments with your caregiver.If the area involved is on your face, you may receive a referral for follow-up to a specialist, such as an eye doctor (ophthalmologist) or an ear, nose, and throat (ENT) doctor.  Keeping all follow-up appointments will help you avoid eye complications, chronic pain, or disability.  SEEK IMMEDIATE MEDICAL CARE IF:   You have facial pain, pain around the eye area, or loss of feeling on one side of your face.  You have ear pain or ringing in your ear.  You have loss of taste.  Your pain is not relieved with prescribed medicines.   Your redness or swelling spreads.   You have more pain and swelling.  Your condition is worsening or has changed.   You have a feveror persistent symptoms for more than 2 3 days.  You have a fever and your symptoms suddenly get worse. MAKE SURE YOU:  Understand these instructions.  Will watch your condition.  Will get help right away if you are not doing well or get worse. Document Released: 09/03/2005 Document Revised: 05/28/2012 Document Reviewed: 04/17/2012 [facility] Patient Information 2014 [place], [place].

## 2013-06-26 ENCOUNTER — Telehealth: Payer: Self-pay | Admitting: *Deleted

## 2013-06-26 DIAGNOSIS — E1165 Type 2 diabetes mellitus with hyperglycemia: Secondary | ICD-10-CM

## 2013-06-26 MED ORDER — INSULIN DETEMIR 100 UNIT/ML ~~LOC~~ SOLN: 25.0000 [IU] | Freq: Every day | SUBCUTANEOUS | Status: DC

## 2013-06-26 MED ORDER — INSULIN ASPART 100 UNIT/ML ~~LOC~~ SOLN: 20.0000 [IU] | Freq: Three times a day (TID) | SUBCUTANEOUS | Status: DC

## 2013-06-26 NOTE — Telephone Encounter (Signed)
Pt was given samples on 06/24/13 to help get her out of the doughnut hole.

## 2013-08-04 ENCOUNTER — Ambulatory Visit: Payer: Self-pay | Admitting: Internal Medicine

## 2013-10-26 ENCOUNTER — Other Ambulatory Visit: Payer: Self-pay | Admitting: Internal Medicine

## 2013-10-26 NOTE — Telephone Encounter (Signed)
Refill one 30 days only.  Has not been seen in over 8 months and she is a diabetic so she needs a  30 minute visit.   Fasting labs prior

## 2013-10-26 NOTE — Telephone Encounter (Signed)
Refill sent.

## 2013-10-26 NOTE — Telephone Encounter (Signed)
Ok refill? 

## 2013-12-17 ENCOUNTER — Encounter: Payer: Self-pay | Admitting: Internal Medicine

## 2013-12-17 ENCOUNTER — Ambulatory Visit (INDEPENDENT_AMBULATORY_CARE_PROVIDER_SITE_OTHER): Payer: Self-pay | Admitting: Internal Medicine

## 2013-12-17 ENCOUNTER — Telehealth: Payer: Self-pay | Admitting: Internal Medicine

## 2013-12-17 VITALS — BP 124/82 | HR 83 | Temp 98.7°F | Resp 16 | Wt 182.0 lb

## 2013-12-17 DIAGNOSIS — W57XXXA Bitten or stung by nonvenomous insect and other nonvenomous arthropods, initial encounter: Secondary | ICD-10-CM

## 2013-12-17 DIAGNOSIS — N921 Excessive and frequent menstruation with irregular cycle: Secondary | ICD-10-CM

## 2013-12-17 DIAGNOSIS — S60469A Insect bite (nonvenomous) of unspecified finger, initial encounter: Secondary | ICD-10-CM

## 2013-12-17 DIAGNOSIS — E1165 Type 2 diabetes mellitus with hyperglycemia: Secondary | ICD-10-CM

## 2013-12-17 DIAGNOSIS — L089 Local infection of the skin and subcutaneous tissue, unspecified: Secondary | ICD-10-CM

## 2013-12-17 DIAGNOSIS — N920 Excessive and frequent menstruation with regular cycle: Secondary | ICD-10-CM

## 2013-12-17 DIAGNOSIS — IMO0002 Reserved for concepts with insufficient information to code with codable children: Secondary | ICD-10-CM

## 2013-12-17 DIAGNOSIS — IMO0001 Reserved for inherently not codable concepts without codable children: Secondary | ICD-10-CM

## 2013-12-17 LAB — COMPREHENSIVE METABOLIC PANEL
ALK PHOS: 102 U/L (ref 39–117)
ALT: 38 U/L — AB (ref 0–35)
AST: 34 U/L (ref 0–37)
Albumin: 3.5 g/dL (ref 3.5–5.2)
BUN: 11 mg/dL (ref 6–23)
CO2: 24 mEq/L (ref 19–32)
Calcium: 9.3 mg/dL (ref 8.4–10.5)
Chloride: 93 mEq/L — ABNORMAL LOW (ref 96–112)
Creatinine, Ser: 0.7 mg/dL (ref 0.4–1.2)
GFR: 103.13 mL/min (ref >60.00)
Glucose, Bld: 450 mg/dL — ABNORMAL HIGH (ref 70–99)
Potassium: 4 mEq/L (ref 3.5–5.1)
SODIUM: 128 meq/L — AB (ref 135–145)
TOTAL PROTEIN: 7.3 g/dL (ref 6.0–8.3)
Total Bilirubin: 0.4 mg/dL (ref 0.3–1.2)

## 2013-12-17 LAB — CBC WITH DIFFERENTIAL/PLATELET
BASOS PCT: 0.4 % (ref 0.0–3.0)
Basophils Absolute: 0 10*3/uL (ref 0.0–0.1)
EOS PCT: 1.9 % (ref 0.0–5.0)
Eosinophils Absolute: 0.1 10*3/uL (ref 0.0–0.7)
HEMATOCRIT: 38.7 % (ref 36.0–46.0)
HEMOGLOBIN: 12.9 g/dL (ref 12.0–15.0)
LYMPHS ABS: 1.9 10*3/uL (ref 0.7–4.0)
Lymphocytes Relative: 27.5 % (ref 12.0–46.0)
MCHC: 33.3 g/dL (ref 30.0–36.0)
MCV: 85 fl (ref 78.0–100.0)
MONO ABS: 0.4 10*3/uL (ref 0.1–1.0)
Monocytes Relative: 5.9 % (ref 3.0–12.0)
NEUTROS ABS: 4.5 10*3/uL (ref 1.4–7.7)
Neutrophils Relative %: 64.3 % (ref 43.0–77.0)
Platelets: 255 10*3/uL (ref 150.0–400.0)
RBC: 4.55 Mil/uL (ref 3.87–5.11)
RDW: 14.1 % (ref 11.5–14.6)
WBC: 7.1 10*3/uL (ref 4.5–10.5)

## 2013-12-17 LAB — LIPID PANEL
CHOL/HDL RATIO: 6
Cholesterol: 209 mg/dL — ABNORMAL HIGH (ref 0–200)
HDL: 37.4 mg/dL — ABNORMAL LOW (ref >39.00)
LDL Cholesterol: 110 mg/dL — ABNORMAL HIGH (ref 0–99)
TRIGLYCERIDES: 306 mg/dL — AB (ref 0.0–149.0)
VLDL: 61.2 mg/dL — ABNORMAL HIGH (ref 0.0–40.0)

## 2013-12-17 LAB — MICROALBUMIN / CREATININE URINE RATIO
Creatinine,U: 27.6 mg/dL
Microalb Creat Ratio: 1.5 mg/g (ref 0.0–30.0)
Microalb, Ur: 0.4 mg/dL (ref 0.0–1.9)

## 2013-12-17 LAB — HEMOGLOBIN A1C: HEMOGLOBIN A1C: 16.1 % — AB (ref 4.6–6.5)

## 2013-12-17 MED ORDER — ALPRAZOLAM 0.5 MG PO TABS: ORAL_TABLET | ORAL | Status: DC

## 2013-12-17 MED ORDER — INSULIN NPH (HUMAN) (ISOPHANE) 100 UNIT/ML ~~LOC~~ SUSP: 15.0000 [IU] | Freq: Every day | SUBCUTANEOUS | Status: DC

## 2013-12-17 MED ORDER — SERTRALINE HCL 100 MG PO TABS
ORAL_TABLET | ORAL | Status: DC
Start: 2013-12-17 — End: 2015-05-17

## 2013-12-17 MED ORDER — GLIPIZIDE 5 MG PO TABS: 5.0000 mg | ORAL_TABLET | Freq: Two times a day (BID) | ORAL | Status: DC

## 2013-12-17 MED ORDER — "INSULIN SYRINGE/NEEDLE 28G X 1/2"" 1 ML MISC": 1.0000 | Freq: Every day | Status: DC

## 2013-12-17 MED ORDER — DOXYCYCLINE HYCLATE 100 MG PO CAPS: 100.0000 mg | ORAL_CAPSULE | Freq: Two times a day (BID) | ORAL | Status: DC

## 2013-12-17 MED ORDER — FLUCONAZOLE 150 MG PO TABS: 150.0000 mg | ORAL_TABLET | Freq: Every day | ORAL | Status: DC

## 2013-12-17 NOTE — Patient Instructions (Addendum)
I am treating you for a scalp infection and voering you for both MRSA (Staph) and tick borne illnesses given your recent headache and tick bite,  With doxycycline.  Take the doxy twice daily with food to prevent nausea  Please take a probiotic ( Align, Floraque or Culturelle) or [facility]'s generic equivalent  while you are on the antibiotic to prevent a serious antibiotic associated diarrhea  Called clostirudium dificile colitis and a vaginal yeast infection    For your diabetes  We are stopping the novolog short acting insulin  We will start glipizide 5 mg twice daily before breakfast and dinner,  And start Novolin N insulin  15 units at bedtime You can increase the insulin every 3 days by 3 units  Until fasting glucose is under 150   We can also increase the glipizide  to 10 mg twice daily if pre dinner sugars are still > 150 after two weeks   Use Mychart to send me records of your sugars   PLEASE RETURN IN 3 MONTHS

## 2013-12-17 NOTE — Progress Notes (Signed)
Patient ID: [name], female   DOB: 03-04-1971, 43 y.o.   MRN: [id number]   Patient Active Problem List   Diagnosis Date Noted  . Bite, nonvenomous insect, face, neck, or scalp with infection 12/19/2013  . Pain 06/24/2013  . Gastroenteritis 12/03/2011  . Pharyngitis 11/19/2011  . Numbness and tingling of right arm and leg 09/18/2011  . Menometrorrhagia 08/21/2011  . Depression   . GERD (gastroesophageal reflux disease)   . History of migraine headaches   . Screening for cervical cancer 07/18/2011  . Diabetes mellitus type 2, uncontrolled     Subjective:  CC:   Chief Complaint  Patient presents with  . Acute Visit    Large papul  red with eeschar on left side of head    HPI:   [name] is a 43 y.o. female who presents for  Follow up on acute and chronic issues.    Acute:  Noticed a swelling of an area on scalp above left ear.   Has gotten larger, started having a headache.  History of a tick bite 2 weeks ag on her leg  2) DM follow up.  Her morning fastings sugars 200, post prandials are usually 250. Last seen July 2014  And started on novolog three times daily .     Past Medical History  Diagnosis Date  . Diabetes mellitus   . allergic rhinitis   . Depression   . GERD (gastroesophageal reflux disease)   . History of migraine headaches   . tobacco abuse     Past Surgical History  Procedure Laterality Date  . Tubal ligation    . Cesarean section      x 2,  breech, premature 7 wks       The following portions of the patient's history were reviewed and updated as appropriate: Allergies, current medications, and problem list.    Review of Systems:   Patient denies headache, fevers, malaise, unintentional weight loss, skin rash, eye pain, sinus congestion and sinus pain, sore throat, dysphagia,  hemoptysis , cough, dyspnea, wheezing, chest pain, palpitations, orthopnea, edema, abdominal pain, nausea, melena, diarrhea, constipation, flank pain, dysuria,  hematuria, urinary  Frequency, nocturia, numbness, tingling, seizures,  Focal weakness, Loss of consciousness,  Tremor, insomnia, depression, anxiety, and suicidal ideation.     History   Social History  . Marital Status: Single    Spouse Name: N/A    Number of Children: N/A  . Years of Education: N/A   Occupational History  . Not on file.   Social History Main Topics  . Smoking status: Former Smoker    Quit date: 07/17/2010  . Smokeless tobacco: Never Used  . Alcohol Use: No  . Drug Use: No  . Sexual Activity: Not on file   Other Topics Concern  . Not on file   Social History Narrative  . No narrative on file    Objective:  Filed Vitals:   12/17/13 1330  BP: 124/82  Pulse: 83  Temp: 98.7 F (37.1 C)  Resp: 16     General appearance: alert, cooperative and appears stated age Ears: normal TM's and external ear canals both ears Throat: lips, mucosa, and tongue normal; teeth and gums normal Neck: no adenopathy, no carotid bruit, supple, symmetrical, trachea midline and thyroid not enlarged, symmetric, no tenderness/mass/nodules Back: symmetric, no curvature. ROM normal. No CVA tenderness. Lungs: clear to auscultation bilaterally Heart: regular rate and rhythm, S1, S2 normal, no murmur, click, rub or gallop Abdomen: soft, non-tender;  bowel sounds normal; no masses,  no organomegaly Pulses: 2+ and symmetric Skin: Skin color, texture, turgor normal. No rashes or lesions Lymph nodes: Cervical, supraclavicular, and axillary nodes normal.  Assessment and Plan:  Bite, nonvenomous insect, face, neck, or scalp with infection Suspected ,  Given recent tick bite and headache, .  Will use doxycyline  To cover RMSF and Staph Aureus.   Diabetes mellitus type 2, uncontrolled Uncontrolled Lost to follow up,  Using novolog qid.  Will changed to glipizide 5 mg bid and NPH 15 untis qhs with weekly adjustments planned.  Lab Results  Component Value Date   HGBA1C 16.1* 12/17/2013       Updated Medication List Outpatient Encounter Prescriptions as of 12/17/2013  Medication Sig  . ALPRAZolam (XANAX) 0.5 MG tablet TAKE ONE TABLET BY MOUTH AT BEDTIME AS NEEDED FOR SLEEP OR ANXIETY  . glucose blood test strip Use as instructed up to 4 times daily  . Insulin Pen Needle 31G X 6 MM MISC Patient test blood sugar two times daily.  . Lancets MISC Patient test blood sugar two times daily.  . sertraline (ZOLOFT) 100 MG tablet TAKE ONE TABLET BY MOUTH ONCE DAILY  . [DISCONTINUED] ALPRAZolam (XANAX) 0.5 MG tablet TAKE ONE TABLET BY MOUTH AT BEDTIME AS NEEDED FOR SLEEP OR ANXIETY  . [DISCONTINUED] insulin aspart (NOVOLOG FLEXPEN) 100 UNIT/ML injection Inject 20 Units into the skin 3 (three) times daily before meals.  . [DISCONTINUED] insulin aspart (NOVOLOG) 100 UNIT/ML injection Inject 20 Units into the skin 3 (three) times daily before meals.  . [DISCONTINUED] insulin detemir (LEVEMIR) 100 UNIT/ML injection Inject 0.25 mLs (25 Units total) into the skin at bedtime.  . [DISCONTINUED] sertraline (ZOLOFT) 100 MG tablet TAKE ONE TABLET BY MOUTH ONCE DAILY  . doxycycline (VIBRAMYCIN) 100 MG capsule Take 1 capsule (100 mg total) by mouth 2 (two) times daily.  . fluconazole (DIFLUCAN) 150 MG tablet Take 1 tablet (150 mg total) by mouth daily.  Marland Kitchen gabapentin (NEURONTIN) 300 MG capsule Take 300 mg twice daily  . glipiZIDE (GLUCOTROL) 5 MG tablet Take 1 tablet (5 mg total) by mouth 2 (two) times daily before a meal.  . INS SYRINGE/NEEDLE 1CC/28G (B-D INSULIN SYRINGE 1CC/28G) 28G X 1/2" 1 ML MISC 1 Syringe by Does not apply route daily after supper.  . insulin NPH Human (NOVOLIN N) 100 UNIT/ML injection Inject 0.15 mLs (15 Units total) into the skin at bedtime.  . traMADol (ULTRAM) 50 MG tablet Take 1 tablet (50 mg total) by mouth every 8 (eight) hours as needed for pain.     Orders Placed This Encounter  Procedures  . Hemoglobin A1c  . Comprehensive metabolic panel  . Microalbumin /  creatinine urine ratio  . CBC with Differential  . Lipid panel    No Follow-up on file.

## 2013-12-19 DIAGNOSIS — IMO0002 Reserved for concepts with insufficient information to code with codable children: Secondary | ICD-10-CM | POA: Insufficient documentation

## 2013-12-19 NOTE — Assessment & Plan Note (Addendum)
Uncontrolled Lost to follow up,  Using novolog qid.  Will changed to glipizide 5 mg bid and NPH 15 untis qhs with weekly adjustments planned.  Lab Results  Component Value Date   HGBA1C 16.1* 12/17/2013

## 2013-12-19 NOTE — Assessment & Plan Note (Signed)
Suspected ,  Given recent tick bite and headache, .  Will use doxycyline  To cover RMSF and Staph Aureus.

## 2013-12-21 ENCOUNTER — Encounter: Payer: Self-pay | Admitting: *Deleted

## 2014-01-20 ENCOUNTER — Other Ambulatory Visit: Payer: Self-pay | Admitting: Internal Medicine

## 2014-01-20 NOTE — Telephone Encounter (Signed)
Last visit 12/17/13 

## 2014-01-21 NOTE — Telephone Encounter (Signed)
REFILL DENIED FOR ALPRAZOLAM.  Has not been seen by me  since July for DM follow up

## 2014-01-27 NOTE — Telephone Encounter (Signed)
Message left

## 2014-01-30 ENCOUNTER — Emergency Department: Payer: Self-pay | Admitting: Emergency Medicine

## 2014-01-30 LAB — CBC WITH DIFFERENTIAL/PLATELET
Basophil #: 0 10*3/uL (ref 0.0–0.1)
Basophil %: 0.5 %
EOS ABS: 0.2 10*3/uL (ref 0.0–0.7)
Eosinophil %: 2.1 %
HCT: 40.8 % (ref 35.0–47.0)
HGB: 13.4 g/dL (ref 12.0–16.0)
LYMPHS ABS: 1.7 10*3/uL (ref 1.0–3.6)
LYMPHS PCT: 20.5 %
MCH: 28.1 pg (ref 26.0–34.0)
MCHC: 32.8 g/dL (ref 32.0–36.0)
MCV: 86 fL (ref 80–100)
Monocyte #: 0.6 x10 3/mm (ref 0.2–0.9)
Monocyte %: 7 %
NEUTROS ABS: 5.9 10*3/uL (ref 1.4–6.5)
Neutrophil %: 69.9 %
PLATELETS: 285 10*3/uL (ref 150–440)
RBC: 4.77 10*6/uL (ref 3.80–5.20)
RDW: 13.7 % (ref 11.5–14.5)
WBC: 8.5 10*3/uL (ref 3.6–11.0)

## 2014-01-30 LAB — BASIC METABOLIC PANEL
Anion Gap: 6 — ABNORMAL LOW (ref 7–16)
BUN: 15 mg/dL (ref 7–18)
CHLORIDE: 101 mmol/L (ref 98–107)
Calcium, Total: 8.9 mg/dL (ref 8.5–10.1)
Co2: 28 mmol/L (ref 21–32)
Creatinine: 0.69 mg/dL (ref 0.60–1.30)
EGFR (African American): >60
GLUCOSE: 347 mg/dL — AB (ref 65–99)
Osmolality: 285 (ref 275–301)
POTASSIUM: 4 mmol/L (ref 3.5–5.1)
SODIUM: 135 mmol/L — AB (ref 136–145)

## 2014-01-30 LAB — URINALYSIS, COMPLETE
BACTERIA: NONE SEEN
Bilirubin,UR: NEGATIVE
Blood: NEGATIVE
Glucose,UR: >=500 mg/dL (ref 0–75)
KETONE: NEGATIVE
LEUKOCYTE ESTERASE: NEGATIVE
Nitrite: NEGATIVE
PH: 5 (ref 4.5–8.0)
Protein: NEGATIVE
RBC, UR: NONE SEEN /HPF (ref 0–5)
SPECIFIC GRAVITY: 1.039 (ref 1.003–1.030)

## 2014-01-30 LAB — TROPONIN I: Troponin-I: <0.02 ng/mL

## 2014-02-04 ENCOUNTER — Emergency Department: Payer: Self-pay | Admitting: Emergency Medicine

## 2014-02-04 ENCOUNTER — Telehealth: Payer: Self-pay | Admitting: Internal Medicine

## 2014-02-04 NOTE — Telephone Encounter (Signed)
Pt to be evaluated in ED, see note below

## 2014-02-04 NOTE — Telephone Encounter (Signed)
Patient Information:  Caller Name: [name]  Phone: [phone]  Patient: [name], [name]  Gender: Female  DOB: October 13, 1970  Age: 43 Years  PCP: Deborra Medina (Adults only)  Pregnant: No  Office Follow Up:  Does the office need to follow up with this patient?: No  Instructions For The Office: N/A  RN Note:  Spoke with Butch Penny in office about schedule for ED follow up.  First Appt was [employer].  Pt did not feel that she could wait that long.  Also, no office appt today.  Butch Penny advised to go ahead and triage pt.  Pt advised UC for current needs.  She states that she will go to ED. Pt advised to call back asap for appt follow up.  She agreed.  Symptoms  Reason For Call & Symptoms: Pt was seen in ED on 5/16for syncope.  Pt states that she has done well except for right arm that has continued to become more painful.  Pt states that at the time of the ED visit she was not examined for the arm pain.  Pt reports that whole arm hurts and shoulder and into neck.  No prior problems with arm.  Reviewed Health History In EMR: Yes  Reviewed Medications In EMR: Yes  Reviewed Allergies In EMR: Yes  Reviewed Surgeries / Procedures: Yes  Date of Onset of Symptoms: 01/30/2014 OB / GYN:  LMP: Unknown  Guideline(s) Used:  Arm Injury  Shoulder Injury  Disposition Per Guideline:   See Today in Office  Reason For Disposition Reached:   Can't move injured shoulder normally (e.g., full range of motion, able to touch top of head)  Advice Given:  Call Back If:  You become worse.  RN Overrode Recommendation:  Go To ED  No office appt per Butch Penny, Pt advised UC and she plans to go to ED

## 2014-06-14 ENCOUNTER — Telehealth: Payer: Self-pay | Admitting: Internal Medicine

## 2014-06-14 NOTE — Telephone Encounter (Signed)
Patient Information:  Caller Name: [name]  Phone: [phone]  Patient: [name], [name]  Gender: Female  DOB: 1971/07/28  Age: 43 Years  PCP: Deborra Medina (Adults only)  Pregnant: No  Office Follow Up:  Does the office need to follow up with this patient?: No  Instructions For The Office: N/A  RN Note:  Patient was made aware that there or no available appointments at [facility]. Offered to look at other [place] locations for availability though patient declines stating her car is not reliable enough to get her to [facility] or [facility] locations. Called office and reported to Dominica. Lorriane Shire states she will inform the nurse of patient's plans to proceed to [facility] or ED.  Symptoms  Reason For Call & Symptoms: Episode of cold sweats, chills, feeling hot, feeling as if about to pass out, nausea,  and abdominal cramping. Patient has been experiencing burning with urination with right lower back pain "over kidney"  x 3-4 days preceding this episode.  Reviewed Health History In EMR: Yes  Reviewed Medications In EMR: Yes  Reviewed Allergies In EMR: Yes  Reviewed Surgeries / Procedures: Yes  Date of Onset of Symptoms: 06/14/2014  Treatments Tried: Patient sat down and rested and called office.  Treatments Tried Worked: No  Any Fever: Yes  Fever Taken: Tactile  Fever Time Of Reading: 14:50:00  Fever Last Reading: N/A OB / GYN:  LMP: 05/24/2014  Guideline(s) Used:  Urination Pain - Female  Disposition Per Guideline:   Go to Office Now  Reason For Disposition Reached:   Side (flank) or lower back pain present  Advice Given:  N/A  Patient Will Follow Care Advice:  YES

## 2014-06-14 NOTE — Telephone Encounter (Signed)
FYI

## 2014-08-03 ENCOUNTER — Other Ambulatory Visit: Payer: Self-pay | Admitting: Internal Medicine

## 2014-08-25 ENCOUNTER — Other Ambulatory Visit: Payer: Self-pay | Admitting: Internal Medicine

## 2014-09-29 ENCOUNTER — Emergency Department: Payer: Self-pay | Admitting: Emergency Medicine

## 2014-09-29 LAB — BASIC METABOLIC PANEL
ANION GAP: 11 (ref 7–16)
BUN: 7 mg/dL (ref 7–18)
Calcium, Total: 8.6 mg/dL (ref 8.5–10.1)
Chloride: 98 mmol/L (ref 98–107)
Co2: 23 mmol/L (ref 21–32)
Creatinine: 0.8 mg/dL (ref 0.60–1.30)
EGFR (African American): >60
EGFR (Non-African Amer.): >60
Glucose: 562 mg/dL (ref 65–99)
OSMOLALITY: 288 (ref 275–301)
Potassium: 3.6 mmol/L (ref 3.5–5.1)
Sodium: 132 mmol/L — ABNORMAL LOW (ref 136–145)

## 2014-09-29 LAB — TROPONIN I
Troponin-I: <0.02 ng/mL
Troponin-I: <0.02 ng/mL

## 2014-09-29 LAB — CBC
HCT: 44.3 % (ref 35.0–47.0)
HGB: 15.1 g/dL (ref 12.0–16.0)
MCH: 31.2 pg (ref 26.0–34.0)
MCHC: 34.1 g/dL (ref 32.0–36.0)
MCV: 92 fL (ref 80–100)
Platelet: 229 10*3/uL (ref 150–440)
RBC: 4.84 10*6/uL (ref 3.80–5.20)
RDW: 12.6 % (ref 11.5–14.5)
WBC: 6.7 10*3/uL (ref 3.6–11.0)

## 2014-10-06 IMAGING — CT CT CERVICAL SPINE WITHOUT CONTRAST
3 of 5 series · 12 of 33 positions shown, 14 images · non-contrast
Comparison: CT HEAD W/O CM dated 11/30/2011

CLINICAL DATA: Status post fall.

EXAM:
CT HEAD WITHOUT CONTRAST
CT CERVICAL SPINE WITHOUT CONTRAST
TECHNIQUE: Multidetector CT imaging of the head and cervical spine was
performed following the standard protocol without intravenous
contrast. Multiplanar CT image reconstructions of the cervical spine
were also generated.

[Series 8: orthogonal axials · axial · 0.20mm/px · z∈[-201,-84]mm · 4 of 101 slices shown, 5 images]
[im 17/101  soft-tissue]
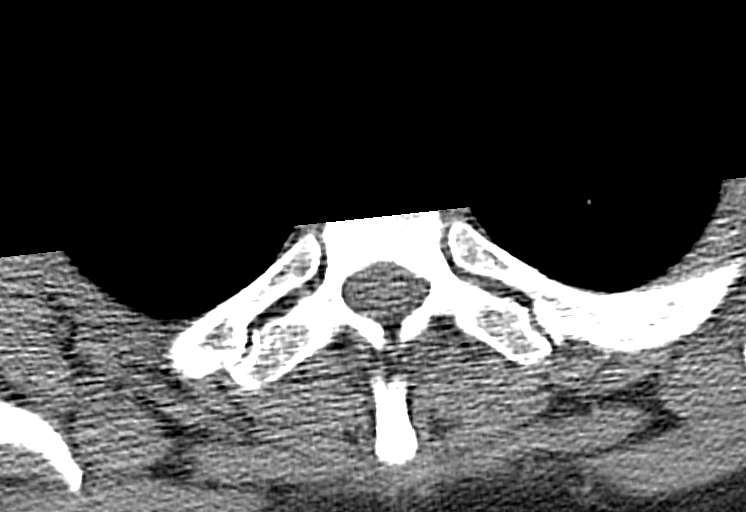
[im 17/101  bone]
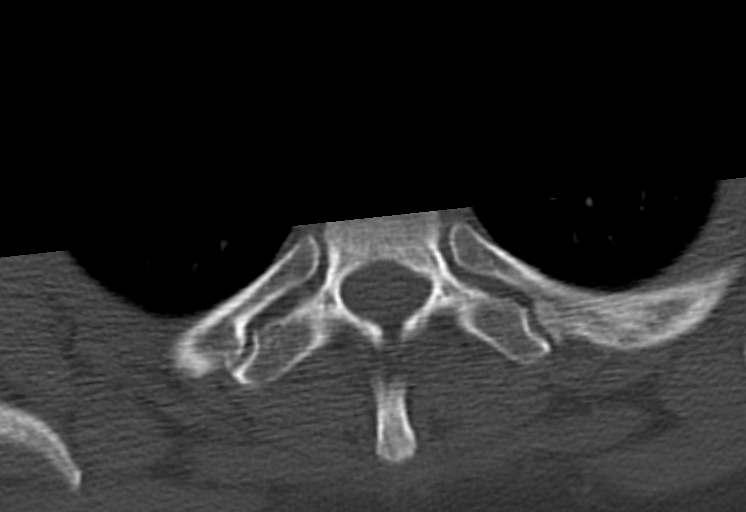
[im 34/101  bone]
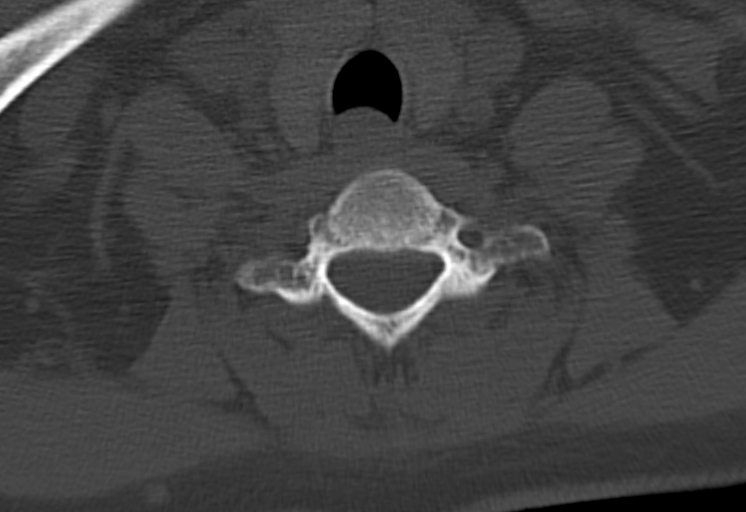
[im 67/101  bone]
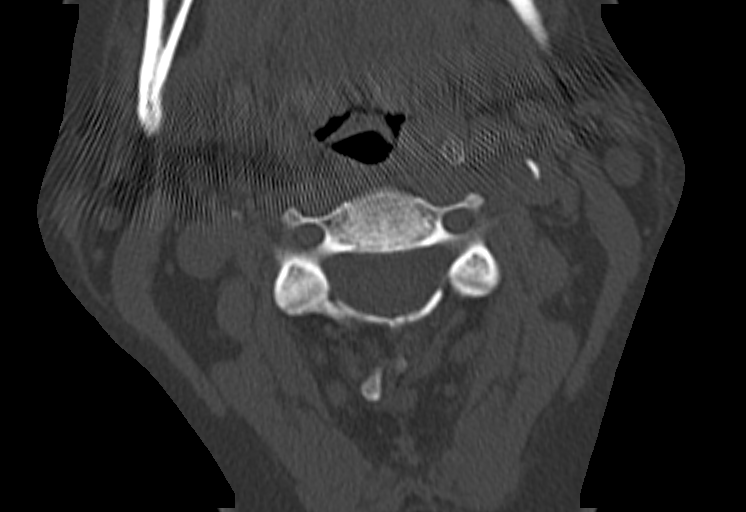
[im 84/101  bone]
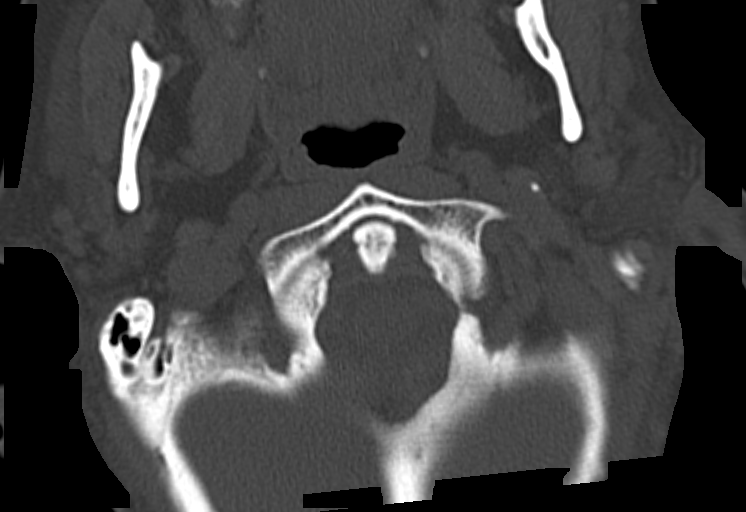

[Series 9: sag bone · sagittal · 0.24mm/px · 5 of 39 slices shown, 6 images]
[im 13/39  bone]
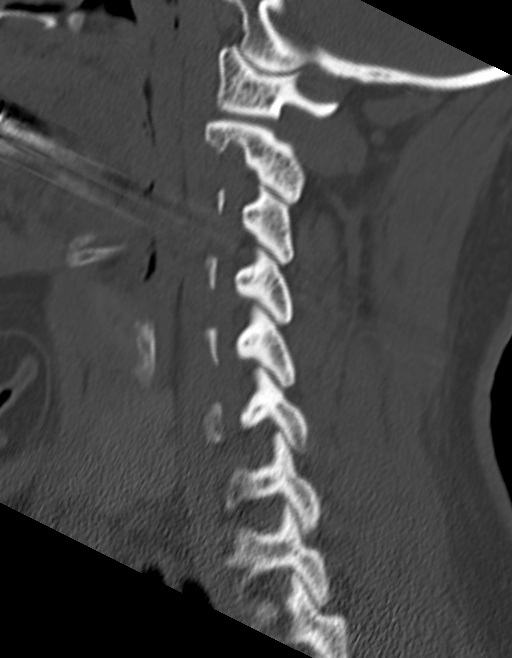
[im 16/39  bone]
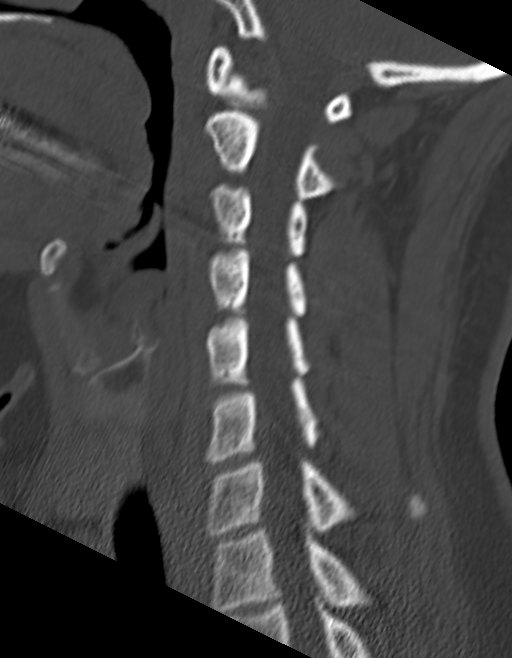
[im 20/39  soft-tissue]
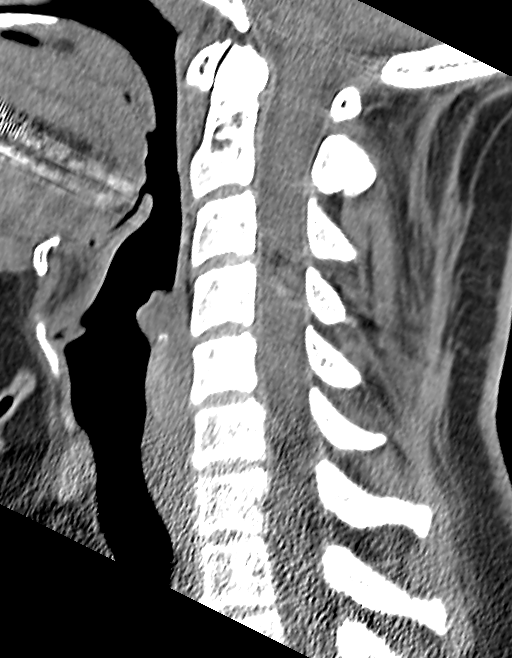
[im 20/39  bone]
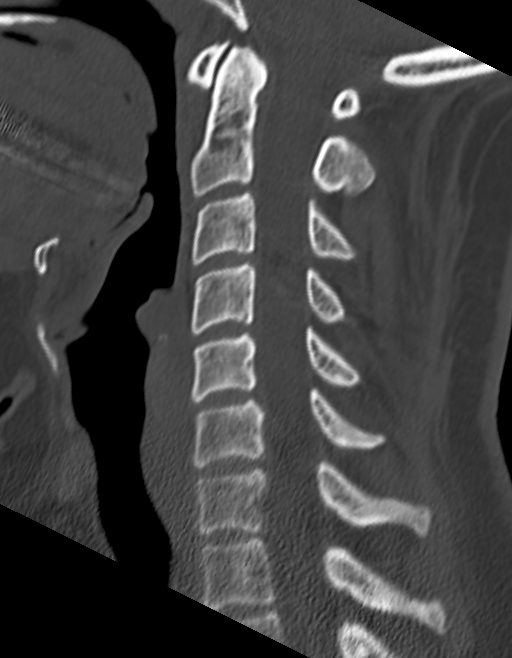
[im 23/39  bone]
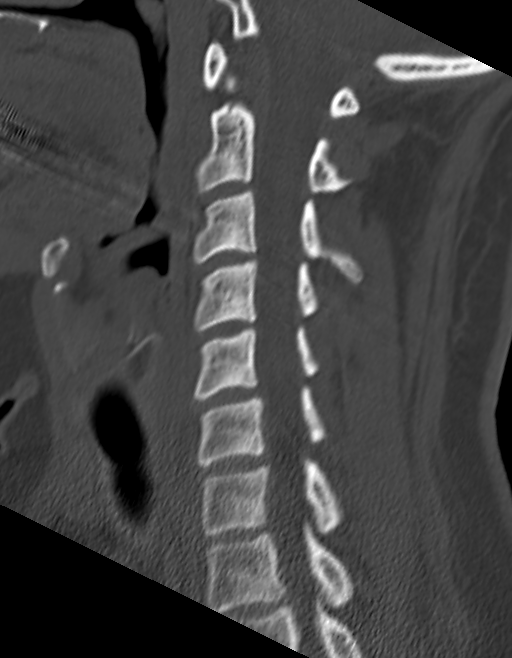
[im 26/39  bone]
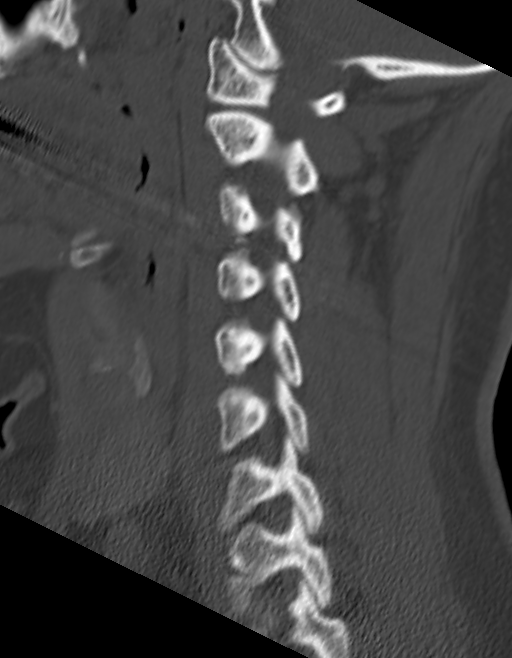

[Series 10: cor bone · coronal · 0.20mm/px · 3 of 35 slices shown]
[im 7/35  bone]
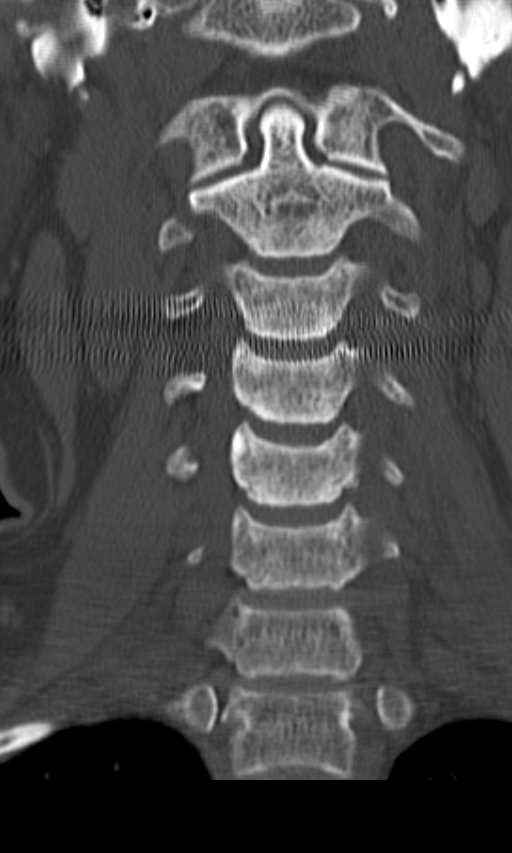
[im 14/35  bone]
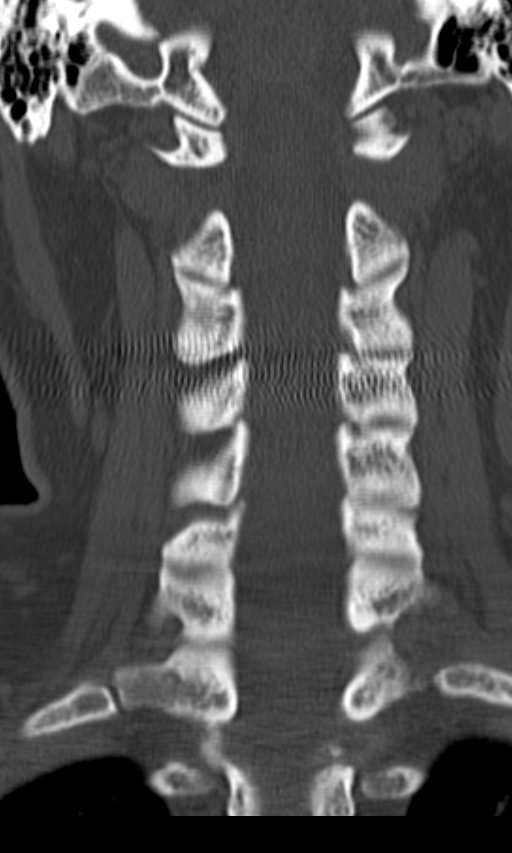
[im 21/35  bone]
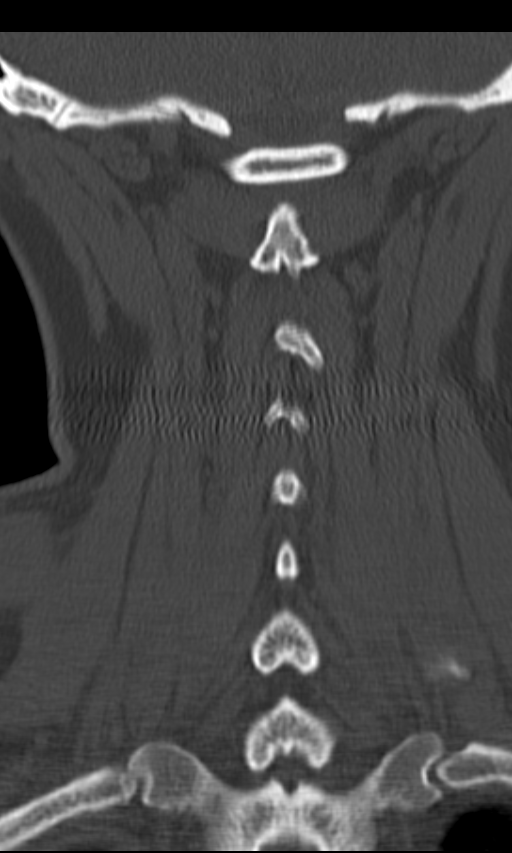

[12 of 33 positions shown; findings below may reference images not displayed]

FINDINGS: CT HEAD FINDINGS

There is no intra or extra-axial fluid collection or mass lesion.
The basilar cisterns and ventricles have a normal appearance. There
is no CT evidence for acute infarction or hemorrhage. Paranasal
sinuses and mastoid air cells are well aerated. Calvarium is intact.

CT CERVICAL SPINE FINDINGS

Reversal the normal cervical lordosis. No evidence for acute
fracture. No significant degenerative change. Regional soft tissues
unremarkable.
IMPRESSION: 1. No evidence for acute intracranial process.
2. No evidence for acute cervical spine fracture.

## 2015-01-09 NOTE — Discharge Summary (Signed)
PATIENT NAME:  [name], [name] MR#:  [id number] DATE OF BIRTH:  1970/12/20  DATE OF ADMISSION:  11/27/2011 DATE OF DISCHARGE:  12/01/2011  ADDENDUM  DISCHARGE MEDICATIONS:  1. Xanax 0.5 mg b.i.d. p.r.n.  2. Metformin 1000 mg b.i.d.  3. Zoloft 100 mg daily.  4. NovoLog sliding scale. 5. Lantus 55 units at bedtime.  6. Lisinopril 5 mg daily.  7. Fioricet 1 tab q.6 p.r.n.  8. Promethazine 25 mg every eight hours p.r.n.  9. Chloraseptic q.2 hours p.r.n.  10. Naproxen 500 mg b.i.d. p.r.n.  11. Prilosec 20 mg daily.     ____________________________ Baxter Hire, MD jdj:ap D: 12/01/2011 08:39:03 ET T: 12/01/2011 14:51:59 ET JOB#: [id number]  cc: Baxter Hire, MD, <Dictator> Baxter Hire MD ELECTRONICALLY SIGNED 12/01/2011 15:15

## 2015-01-09 NOTE — Discharge Summary (Signed)
PATIENT NAME:  [name], [name] MR#:  [id number] DATE OF BIRTH:  Mar 17, 1971  DATE OF ADMISSION:  11/27/2011 DATE OF DISCHARGE:  12/01/2011  PRIMARY CARE PHYSICIAN: Deborra Medina, MD  CONSULTANT: Verdie Shire, MD - Gastroenterology  DISCHARGE DIAGNOSES:  1. Nausea, vomiting, and diarrhea.  2. Dehydration.  3. Orthostatic hypotension.  4. Hyperglycemia.  5. Headache.  6. Recent acute pharyngitis.  7. History of hypertension.   REASON FOR ADMISSION: This is a 44 year old female who had recent history of  acute pharyngitis. She also had some nausea and vomiting and has been unable to maintain p.o. intake. She also started having some diarrhea. She had been on Augmentin. She has also been feeling lightheaded and was found to have orthostatic hypotension. She was admitted for further treatment.   HOSPITAL COURSE:  1. Nausea, vomiting, and diarrhea: This could be gastroenteritis, also could be some reaction to the Augmentin. She was made n.p.o., given antiemetics and PPI. Symptoms remained. She had a Gastroenterology consultation, but before they could do any further work-up her symptoms resolved. She is going to remain on the Prilosec and p.r.n. antiemetics. She is taking p.o. well.  2. Dehydration: This was secondary to nausea and vomiting. She was given IV fluids and this resolved.  3. Orthostatic hypotension: This was also from dehydration. This was resolved at the time of discharge and she is being restarted on her blood pressure medications at home.  4. Hyperglycemia: This was likely secondary to the steroid taper she was on. She has finished this now and diabetes is better controlled.  5. Recent acute pharyngitis: She has now finished a course of antibiotics. Symptoms have improved and she will follow-up with her primary care doctor.  6. Headache: This appears to be more musculoskeletal. It is mainly in the cervical area. She was given some IV Toradol with symptomatic relief. I will send her home on  some naproxen p.r.n. over the next few days. Also we will have her to continue to take a PPI. I do not want her on any NSAID for long-term use and she will follow-up with her primary care doctor.   DISPOSITION: The patient is discharged in stable condition. She will follow-up with Dr. Derrel Nip in 1 to 2 weeks.   TIME SPENT ON DISCHARGE: 35 minutes.  ____________________________ Baxter Hire, MD jdj:slb D: 12/01/2011 08:37:10 ET T: 12/01/2011 14:40:16 ET JOB#: [id number]  cc: Baxter Hire, MD, <Dictator> Deborra Medina, MD Baxter Hire MD ELECTRONICALLY SIGNED 12/01/2011 15:15

## 2015-01-09 NOTE — Consult Note (Signed)
Chief Complaint:   Subjective/Chief Complaint No acute events.  Tolerating PO. Continued HA.  Continued elevated Blood glucose   VITAL SIGNS/ANCILLARY NOTES: **Vital Signs.:   08-Mar-13 05:40   Vital Signs Type Routine   Temperature Temperature (F) 97.9   Celsius 36.6   Pulse Pulse 68   Respirations Respirations 18   Systolic BP Systolic BP 992   Diastolic BP (mmHg) Diastolic BP (mmHg) 60   Mean BP 76   BP Source manual   Pulse Ox % Pulse Ox % 93   Pulse Ox Activity Level  At rest   Oxygen Delivery Room Air/ 21 %    08:15   Vital Signs Type POCT   Nurse Fingerstick (mg/dL) FSBS (fasting range 65-99 mg/dL) 268   Comments/Interventions  Nurse Notified    42:68   Systolic BP Systolic BP 341   Diastolic BP (mmHg) Diastolic BP (mmHg) 80   Mean BP 94   BP Source manual    11:47   Vital Signs Type POCT   Nurse Fingerstick (mg/dL) FSBS (fasting range 65-99 mg/dL) 281   Comments/Interventions  Nurse Notified   Brief Assessment:   Additional Physical Exam HEENT- signficant improvement in erythema on pharyngeal walls.  moist mucosa   Assessment/Plan:  Assessment/Plan:   Assessment pharyngitis and dehydration    Plan Improved exam and PO intake.  Patient already has prescription for PO augmentin and sterapred taper that she was on as outpatient.  I would like to see her back in 2 weeks for outpatient evaluation.  Will sign off.   Electronic Signatures: Mahmud Keithly, Shela Leff (MD)  (Signed 08-Mar-13 12:07)  Authored: Chief Complaint, VITAL SIGNS/ANCILLARY NOTES, Brief Assessment, Assessment/Plan   Last Updated: 08-Mar-13 12:07 by Pascal Lux (MD)

## 2015-01-09 NOTE — Discharge Summary (Signed)
PATIENT NAME:  [name], [name] MR#:  [id number] DATE OF BIRTH:  [birth year]  DATE OF ADMISSION:  11/21/2011 DATE OF DISCHARGE:  11/23/2011  DISCHARGE DIAGNOSES:  1. Acute pharyngitis. 2. Dehydration, resolved.  3. Nausea, vomiting, resolved. 4. Diabetes. 5. Hypertension. 6. Anxiety.   CONSULT: ENT, Dr. Carloyn Manner.   HOSPITAL COURSE: A 44 year old female who has history of diabetes and hypertension. She was referred for direct admit by Dr. Carloyn Manner because of acute pharyngitis, failure of outpatient therapy, not tolerating p.o., dehydration and nausea, vomiting. Patient presented with sore throat. She was taking Augmentin as outpatient along with prednisone taper. She was unable to keep anything down for about 2 days , she was dehydrated. She was admitted as acute pharyngitis, dehydration with nausea, vomiting. She was started on IV hydration. She was given  IV Zosyn and IV Solu-Medrol. When she came in her creatinine was normal at 0.74, glucose 279 because of steroids. LFTs were normal. Her hemoglobin A1c is 10.1. Her white count was normal at 4.6, hemoglobin 13.5. Her blood cultures remained negative. Her urinalysis was essentially negative except for glucosuria. Her EKG was sinus rhythm, normal EKG. Now patient is tolerating p.o., fluids have been discontinued, nausea and vomiting has almost resolved. IV Solu-Medrol has been changed to prednisone taper. Patient already has prednisone at home and Zosyn has been changed to Augmentin. At discharge her creatinine is normal at 0.8, sugars are running high because of steroids but she does NovoLog sliding scale at home. Dr. Pryor Ochoa has been following her in the hospital. He suggested to continue Augmentin and steroid taper. Patient complained of some migraine headache in the hospital that improved with Fioricet. I am going to give her a few days of Fioricet at home also.   MEDICATIONS AT DISCHARGE/HOME MEDICATIONS:  1. Xanax 0.5 mg b.i.d.   2. Metformin 1000 mg b.i.d.  3. Zoloft 100 mg daily.  4. NovoLog sliding scale. 5. Lantus 55 units at bedtime.  6. Lisinopril 5 mg daily.  7. Augmentin 875 mg p.o. b.i.d. for five days and complete Augmentin at home. 8. Fioricet 1 tablet p.o. q.6 hours as needed for severe headache. 9. Phenergan 25 mg p.o. q.8 hours as needed for nausea.  10. Complete prednisone taper. 11. Chloraseptic spray one spray q.2 hours as needed for throat pain.   CONDITION AT DISCHARGE: She is tolerating diet. T-max 97.9, heart rate 68, blood pressure 110/60 to 122/80, saturating 94% on room air. Chest is clear. Heart sounds are regular. Throat has an exudate on the right side. Headache better.   DIET: Advised a low sodium ADA diet.   FOLLOW UP: Patient should follow up with Dr. Derrel Nip in one week. Follow up with Dr. Carloyn Manner, ENT, in two weeks. I am giving her a doctor's excuse for work also.   TIME SPENT WITH DISCHARGE: 35 minutes.   ____________________________ Mena Pauls, MD ag:cms D: 11/23/2011 14:27:39 ET T: 11/24/2011 11:51:27 ET JOB#: [id number]  cc: Mena Pauls, MD, <Dictator> Deborra Medina, MD Jerene Bears, MD Mena Pauls MD ELECTRONICALLY SIGNED 11/28/2011 16:19

## 2015-01-09 NOTE — Consult Note (Signed)
Admit Diagnosis:   DEHYDRATION PHARYNGITIS: 21-Nov-2011, Active, DEHYDRATION PHARYNGITIS      Admit Reason:   Acute pharyngitis: (462) Active, ICD9, ACUTE PHARYNGITIS    General Aspect 44 y.o. female with history of persistent pharyngitis worsening with 2.5 day history of decreased PO intake and dehydration.  Admitted for IVF and pain control.  Cultures pending from outpatient taken 11/20/11.  Rapid strep negative 11/20/11.  Mono spot negative.    No Known Allergies:   Case History and Physical Exam:   Chief Complaint odynophagia/dehydration    Past Medical Health Hypertension, Diabetes Mellitus    Past Surgical History tonsillectomy, C-section X2    Primary Care Provider Other  [facility]    Family History Coronary Artery Disease  Hypertension  Diabetes Mellitus    HEENT OC/OP- erythema of posterior pharyngeal walls, exudate on right, less on left, ulcers on soft palate and posterior wall/base of tongue    Neck/Nodes tender lymphandenopathy <1cm    Skin WNL   Routine Hem:  06-Mar-13 17:09    WBC (CBC) 5.1   RBC (CBC) 4.69   Hemoglobin (CBC) 13.7   Hematocrit (CBC) 40.8   Platelet Count (CBC) 231   MCV 87   MCH 29.3   MCHC 33.6   RDW 14.1   Neutrophil % 64.9   Lymphocyte % 25.0   Monocyte % 8.6   Eosinophil % 1.0   Basophil % 0.5   Neutrophil # 3.3   Lymphocyte # 1.3   Monocyte # 0.4   Eosinophil # 0.0   Basophil # 0.0  Routine Chem:  06-Mar-13 17:09    Glucose, Serum 279   BUN 12   Creatinine (comp) 0.74   Sodium, Serum 139   Potassium, Serum 4.0   Chloride, Serum 99   CO2, Serum 27   Calcium (Total), Serum 8.9  Hepatic:  06-Mar-13 17:09    Bilirubin, Total 0.3   Alkaline Phosphatase 69   SGPT (ALT) 27   SGOT (AST) 26   Total Protein, Serum 8.5   Albumin, Serum 3.2  Routine Chem:  06-Mar-13 17:09    Osmolality (calc) 287   eGFR (African American) >60   eGFR (Non-African American) >60   Anion Gap 13   Hemoglobin A1c ([facility]) 10.1     Impression  Acute pharyngitis and dehydration    Plan 1)  Most likely viral etiology given negative cultures so far and normal WBC.  Outside culture pending from 11/20/11.  Patient is s/p tonsillectomy and no signs of abcess on physical examination.  Would continue Zosyn at this time with anticipated switch to Augmentin once tolerates PO. 2)  Agree with Solumedrol.  Switch back to prednisone taper once tolerating PO 3)  Encourage PO intake 4)  Will follow   Electronic Signatures: Pascal Lux (MD)  (Signed 06-Mar-13 18:52)  Authored: Health Issues, General Aspect/Present Illness, Allergies, History and Physical Exam, Labs, Impression/Plan   Last Updated: 06-Mar-13 18:52 by Pascal Lux (MD)

## 2015-01-09 NOTE — H&P (Signed)
PATIENT NAME:  [name], [name] MR#:  [id number] DATE OF BIRTH:  1970/12/23  DATE OF ADMISSION:  11/21/2011  PRIMARY CARE PHYSICIAN: Deborra Medina, MD  REFERRING PHYSICIAN: The patient is referred in for direct admission by Dr. Carloyn Manner, ENT.   CHIEF COMPLAINT: Sore throat.   HISTORY OF PRESENT ILLNESS: This is a 44 year old female who has been battling a sore throat since last Wednesday. On Wednesday she went to a PA at [facility] and was given some Claritin thinking it was viral. On Thursday she went to the [facility] and had some swelling on the right side and a rash. She was given Augmentin for pharyngitis and lisinopril for blood pressure and a Flonase inhaler. She progressively got worse. She saw Dr. Derrel Nip on Monday and was given a shot of prednisone and a prednisone taper. At that time, she had some ear pain also. Also given some Afrin nasal spray and pseudoephedrine. She saw Dr. Pryor Ochoa, ENT, yesterday and today. Today she was referred in to the hospital for a direct admission from Dr. Pryor Ochoa secondary to ongoing pharyngitis. She has been unable to keep anything down for the past 2-1/2 days. Every time she eats food she vomits. She vomits up the pills. She has very painful swallowing, 8 out of 10 in intensity, also some ear pain and some painful lymphadenopathy.   PAST MEDICAL HISTORY:  1. Diabetes.  2. Hypertension.   PAST SURGICAL HISTORY: Two Cesarean sections.   ALLERGIES: No known drug allergies.   MEDICATIONS:  1. Metformin 1000 mg twice a day. 2. Lantus 55 units at bedtime. 3. NovoLog 15 units three times daily and sliding scale on top if sugars are high.  4. Zoloft 100 mg at bedtime.  5. Xanax 0.5 mg twice a day. 6. Lisinopril 5 mg daily.  7. Lortab liquid, recently prescribed for pain. 8. Augmentin, recently prescribed. 9. Prednisone taper, recently prescribed. 10. Flonase nasal spray and pseudoephedrine, recently prescribed.   SOCIAL HISTORY: No  smoking. No alcohol. No drug use. Works here in [employer].  FAMILY HISTORY: Father with diabetes, heart disease, hyperlipidemia, and hypertension. Mother with thyroid and hypertension.  REVIEW OF SYSTEMS: CONSTITUTIONAL: Positive for low-grade fever, chills, and sweats. Positive for fatigue and weakness. No weight loss. No weight gain. EYES: She does wear glasses. EARS, NOSE, MOUTH, AND THROAT: Positive for sore throat, 8 out of 10 in intensity. CARDIOVASCULAR: No chest pain. No palpitations. RESPIRATORY: No shortness of breath. No coughing. No sputum. No hemoptysis. GASTROINTESTINAL: Positive for nausea. Positive for vomiting every time she eats for the past 2-1/2 days. No abdominal pain. Positive for constipation. No bright red blood per rectum. No melena. GENITOURINARY: No burning on urination or hematuria. MUSCULOSKELETAL: No joint pain or muscle pain. INTEGUMENTARY: Recent rash on the right neck. NEUROLOGIC: No fainting or blackouts. PSYCHIATRIC: Positive for anxiety and depression. ENDOCRINE: No thyroid problems. HEMATOLOGIC/LYMPHATIC: No anemia. No easy bruising or bleeding.   PHYSICAL EXAMINATION:   VITAL SIGNS: Blood pressure 125/81, respirations 20, pulse 85, temperature 98.5, pulse oximetry 95% on room air. Fingerstick 297.   GENERAL: No respiratory distress.   EYES: Conjunctivae and lids are normal. Pupils equal, round, and reactive to light. Extraocular muscles intact. No nystagmus.   EARS, NOSE, MOUTH, AND THROAT: Tympanic membranes no erythema. Nasal mucosa no erythema. Throat positive for erythema. Also looks like aphthous ulcers on the back roof of the mouth, slight exudate. Lips and gums normal.   NECK: Positive lymphadenopathy, small, tender,  and mobile. No thyromegaly. No thyroid nodules palpated.   LUNGS: Lungs are clear to auscultation. No use of accessory muscles to breathe. No rhonchi, rales, or wheeze heard.   HEART: S1 and S2 normal. No gallops, rubs, or murmurs  heard. Carotid upstroke 2+ bilaterally. No bruits. Dorsalis pedis pulses 2+ bilaterally. No edema of the lower extremities.   ABDOMEN: Soft and nontender. No organomegaly/splenomegaly. Normoactive bowel sounds. No masses felt.   LYMPHATIC: No lymph nodes in the neck.   MUSCULOSKELETAL: No clubbing, edema, or cyanosis.   SKIN: No rashes or ulcers seen.   NEUROLOGIC: Cranial nerves II through XII are grossly intact. Deep tendon reflexes 2+ bilateral lower extremities.   PSYCHIATRIC: The patient is oriented to person, place, and time.   ASSESSMENT AND PLAN:  1. Pharyngitis and dehydration, unable to keep anything down: We will give IV fluid hydration, liquid diet. ENT following, Dr. Pryor Ochoa. He recommended Zosyn. We will have pharmacy dose. We will get blood cultures x2 and a CBC. We will also give Solu-Medrol 40 mg IV every 8 hours, p.r.n. nausea medications, Lortab liquid as needed for pain, and Chloraseptic spray for pain.  2. Diabetes, uncontrolled: We will put on NovoLog sliding scale and continue Lantus and metformin. Sugars will probably be high with Solu-Medrol. We will check a Hemoglobin A1c. 3. Hypertension and diabetic kidney protection: Continue lisinopril.  4. Anxiety/depression: On Zoloft and Xanax.       5. We will get admission labs with CBC, CMP, blood cultures and Hemoglobin A1c. We will also get an EKG.  TIME SPENT ON ADMISSION: 50 minutes.   ____________________________ Tana Conch. Leslye Peer, MD rjw:slb D: 11/21/2011 16:42:08 ET T: 11/21/2011 17:00:25 ET JOB#: [id number]  cc: Tana Conch. Leslye Peer, MD, <Dictator> Deborra Medina, MD Jerene Bears, MD Marisue Brooklyn MD ELECTRONICALLY SIGNED 11/23/2011 13:20

## 2015-01-09 NOTE — Consult Note (Signed)
Chief Complaint:   Subjective/Chief Complaint Looks better and feels better. Only mild nausea. Tolerating solids.   VITAL SIGNS/ANCILLARY NOTES: **Vital Signs.:   15-Mar-13 10:11   Vital Signs Type Q 4hr   Temperature Temperature (F) 97.7   Celsius 36.5   Temperature Source oral   Pulse Pulse 84   Pulse source per Dinamap   Respirations Respirations 18   Systolic BP Systolic BP 95   Diastolic BP (mmHg) Diastolic BP (mmHg) 67   Mean BP 76   BP Source Dinamap   Systolic BP Systolic BP 95   Diastolic BP (mmHg) Diastolic BP (mmHg) 67   Systolic BP Systolic BP 671   Diastolic BP (mmHg) Diastolic BP (mmHg) 76   Systolic BP Systolic BP 92   Diastolic BP (mmHg) Diastolic BP (mmHg) 70   Pulse Ox % Pulse Ox % 97   Pulse Ox Activity Level  At rest   Oxygen Delivery Room Air/ 21 %    11:46   Nurse Fingerstick (mg/dL) FSBS (fasting range 65-99 mg/dL) 264   Comments/Interventions  Nurse Notified   Brief Assessment:   Cardiac Regular    Respiratory normal resp effort    Gastrointestinal Normal   Routine Hem:  15-Mar-13 03:08    WBC (CBC) 7.1   RBC (CBC) 4.93   Hemoglobin (CBC) 14.3   Hematocrit (CBC) 43.6   Platelet Count (CBC) 283   MCV 88   MCH 29.0   MCHC 32.8   RDW 13.9   Neutrophil % 54.3   Lymphocyte % 33.9   Monocyte % 8.9   Eosinophil % 2.4   Basophil % 0.5   Neutrophil # 3.9   Lymphocyte # 2.4   Monocyte # 0.6   Eosinophil # 0.2   Basophil # 0.0  Routine Chem:  15-Mar-13 03:08    Glucose, Serum 268   BUN 11   Creatinine (comp) 0.51   Sodium, Serum 135   Potassium, Serum 4.0   Chloride, Serum 99   CO2, Serum 22   Calcium (Total), Serum 8.7   Osmolality (calc) 279   eGFR (African American) >60   eGFR (Non-African American) >60   Anion Gap 14  Blood Glucose:  15-Mar-13 08:08    POCT Blood Glucose 243   Assessment/Plan:  Assessment/Plan:   Assessment N/V/D- most of the sxs are resolving.    Plan phenergan prn. Will sign off. Can f/u with [place] in  office if sxs worse again. Thanks   Electronic Signatures: Verdie Shire (MD)  (Signed 15-Mar-13 12:18)  Authored: Chief Complaint, VITAL SIGNS/ANCILLARY NOTES, Brief Assessment, Lab Results, Assessment/Plan   Last Updated: 15-Mar-13 12:18 by Verdie Shire (MD)

## 2015-01-09 NOTE — Consult Note (Signed)
Chief Complaint:   Subjective/Chief Complaint No acute events.  Improved oral pain.  +Head ache.  Tolerating some PO today.   VITAL SIGNS/ANCILLARY NOTES: **Vital Signs.:   07-Mar-13 13:44   Vital Signs Type Routine   Temperature Temperature (F) 98.7   Celsius 37   Pulse Pulse 90   Respirations Respirations 18   Systolic BP Systolic BP 815   Diastolic BP (mmHg) Diastolic BP (mmHg) 59   Mean BP 72   Pulse Ox % Pulse Ox % 95   Pulse Ox Activity Level  At rest   Oxygen Delivery Room Air/ 21 %   Brief Assessment:   Additional Physical Exam HEENT- improved posterior erythema and edema.  Improved exudate on right later pharyngeal wall.  airway widely patent.   Routine Hem:  07-Mar-13 03:39    WBC (CBC) 4.6   RBC (CBC) 4.67   Hemoglobin (CBC) 13.5   Hematocrit (CBC) 40.5   Platelet Count (CBC) 223   MCV 87   MCH 29.0   MCHC 33.4   RDW 14.2   Neutrophil % 82.2   Lymphocyte % 14.4   Monocyte % 3.0   Eosinophil % 0.0   Basophil % 0.4   Neutrophil # 3.8   Lymphocyte # 0.7   Monocyte # 0.1   Eosinophil # 0.0   Basophil # 0.0  Blood Glucose:  07-Mar-13 07:24    POCT Blood Glucose 249    11:10    POCT Blood Glucose 274   Assessment/Plan:  Assessment/Plan:   Assessment Acute pharyngitis improved    Plan Switch to oral Augmentin and oral prednisone once able to tolerate.  Outside culture still pending, but normal WBC and improving on current meds.   Electronic Signatures: Jenette Rayson, Shela Leff (MD)  (Signed 07-Mar-13 13:49)  Authored: Chief Complaint, VITAL SIGNS/ANCILLARY NOTES, Brief Assessment, Lab Results, Assessment/Plan   Last Updated: 07-Mar-13 13:49 by Pascal Lux (MD)

## 2015-01-09 NOTE — Consult Note (Signed)
Chief Complaint:   Subjective/Chief Complaint See C.London's notes from yest. More nausea last night but no vomiting. No diarrhea or abd pain. So far, stool studies are neg. Does not recall having side effects from augmentin in the past.   VITAL SIGNS/ANCILLARY NOTES: **Vital Signs.:   14-Mar-13 07:49   Vital Signs Type POCT   Nurse Fingerstick (mg/dL) FSBS (fasting range 65-99 mg/dL) 204   Comments/Interventions  Nurse Notified   Brief Assessment:   Cardiac Regular    Respiratory clear BS    Gastrointestinal Normal     Routine Chem:  14-Mar-13 06:53    Glucose, Serum 205   BUN 10   Creatinine (comp) 0.64   Sodium, Serum 137   Potassium, Serum 4.6   Chloride, Serum 102   CO2, Serum 25   Calcium (Total), Serum 8.4   Anion Gap 10   Osmolality (calc) 279   eGFR (African American) >60   eGFR (Non-African American) >60   Assessment/Plan:  Assessment/Plan:   Assessment N/V/D. Gradually improving. Viral gastroenteritis vs reaction to augmentin. Gastroparesis possible after a viral illness but usually sxs occur gradually over time.    Plan Continue nausea meds. If nausea does not improve over time, then order gastric emptying scan later. Thanks.   Electronic Signatures: Verdie Shire (MD)  (Signed 14-Mar-13 08:02)  Authored: Chief Complaint, VITAL SIGNS/ANCILLARY NOTES, Brief Assessment, Lab Results, Assessment/Plan   Last Updated: 14-Mar-13 08:02 by Verdie Shire (MD)

## 2015-01-09 NOTE — Consult Note (Signed)
Brief Consult Note: Diagnosis: nausea, vomiting, diarrhea.   Patient was seen by consultant.   Consult note dictated.   Discussed with Attending MD.   Comments: Appreciate consult for 44 y/o caucasian woman with history of DM, HTN, anxiety, and recent history of pharyngitis treated with Augmentin and prednisone admitted for NVD. States that she was admitted last week with viral pharyngitis, got better, and went home. Started with NVD and low grade fever 2d ago Monday. Was unable to keep anything down, returned to ED with mild dehydration and readmitted. C-diff negative, stool for wbc negative. Augmentin has been stopped. Reports that her vomiting has improved- none today, states it was yellow in color. Nausea is better. Diarrhea has improved. One stool today- had been having 3-4 loose stools daily that were yellow in color. Denies abdominal pain. No more fevers.  Does report that she has had no known other sick exposures, uses well water (unsure when it was sanitized last), has 2 dogs and one cat, infrequently eats at restaraunts. No foreign travel or camping.  No prior luminal evaluation. Of note, last A1C was 10.1 Impression: NVD- possibly Augmentin side effect, viral exposure, or other infectious agent. Less likely gastroparesis at this point. Will complete stool studies. I would also like to repeat c-diff. Would continue with clears and advance diet slowly as tolerated.  Electronic Signatures: Stephens November H (NP)  (Signed 13-Mar-13 16:06)  Authored: Brief Consult Note   Last Updated: 13-Mar-13 16:06 by Theodore Demark (NP)

## 2015-01-09 NOTE — H&P (Signed)
PATIENT NAME:  [name], [name] MR#:  [id number] DATE OF BIRTH:  [birth year]  DATE OF ADMISSION:  11/27/2011  PRIMARY CARE PHYSICIAN: Deborra Medina, MD   REASON FOR ADMISSION: The patient is sent as a direct admit from Dr. Lupita Dawn office because of nausea, vomiting, unable to tolerate p.o., and dehydration.   HISTORY OF PRESENT ILLNESS: The patient is a 44 year old female with a history of diabetes, hypertension, anxiety. She was recently admitted on 11/21/2011 because of acute pharyngitis, nausea, vomiting and unable to tolerate p.o. at that time. She was discharged on 11/23/2011 with resolution of her dehydration and nausea and vomiting. She said she did well on 11/24/2011, but then on March 10th she started having nausea and vomiting. She is unable to tolerate anything p.o. Even water is coming out, and she is having diarrhea. Whenever she goes to the bathroom to pee she is having liquid stools coming out. She denies any blood in the diarrhea. She denies any fever. No abdominal pain. She has been taking Augmentin for about 7 to 8 days now. She denies any chest pain or shortness of breath. She was feeling dizzy and lightheaded. She went for a follow-up to Dr. Lupita Dawn office today, and she was dehydrated, hypotensive, with a blood pressure of 86/60, tachycardic with a heart rate of 115, and she was orthostatic at that time. She cannot remain upright without feeling presyncopal and becoming nauseated. Her sugars have also been running high, like high 200s, because of the steroids she is taking. She says she has lost about 17 pounds in about two weeks. She was sent as a direct admit. She is very dry at this time. She is asking for something to drink or eat.   REVIEW OF SYSTEMS: CONSTITUTIONAL: Positive for weakness, weight loss. No fever. HEENT: No acute change in vision. No headache but complaining of dizziness and feeling presyncopal. CARDIORESPIRATORY: No cough, shortness of breath. No hemoptysis. No chronic  obstructive pulmonary disease. No chest pain. No dyspnea on exertion. Complaining of nausea, vomiting and diarrhea. GASTROINTESTINAL: No GI bleed. GENITOURINARY: No dysuria, no frequency, but complaining of decreased urinary output. ENDOCRINE: No thyroid problems. HEMATOLOGIC: No anemia. No easy bruisability. INTEGUMENTARY: No rash. MUSCULOSKELETAL: No joint pains. No swelling. NEUROLOGIC: No focal numbness or weakness. PSYCHIATRIC: She has history of anxiety. She is saying her throat is better right now. Her headache which she complained of at the time of last admission has also improved.    PAST MEDICAL HISTORY:  1. Diabetes. Her hemoglobin A1c on March 6th was 10.1.  2. Hypertension. 3. Anxiety.  4. Recent diagnosis of acute pharyngitis with strep being negative. She was following with Dr. Pryor Ochoa, also with ENT.   PAST SURGICAL HISTORY: Cesarean section.   ALLERGIES TO MEDICATIONS: None.   HOME MEDICATIONS:  (At discharge) 1. Xanax 0.5 mg b.i.d.  2. Metformin 1000 mg b.i.d.  3. Zoloft 100 mg daily.  4. NovoLog sliding-scale.  5. Lantus 55 units at bedtime. 6. Lisinopril 5 mg daily.  7. Augmentin 875 mg b.i.d.  8. Fioricet p.r.n.  9. Phenergan p.r.n.  10. Prednisone taper.  11. Chloraseptic spray p.r.n.   SOCIAL HISTORY: She works in the [employer]. No smoking or alcohol use.   FAMILY HISTORY: Father with diabetes, heart disease, hypertension, and hyperlipidemia. Mother with hypertension.  PHYSICAL EXAMINATION:  VITAL SIGNS: Temperature 98.4, heart rate of 87, respiratory 18, blood pressure 118/80, saturating 94% on room air.   GENERAL: This is a young obese Caucasian female  comfortably lying in bed, no acute distress but dehydrated.   HEENT: Bilateral pupils are equal and reactive. Extraocular muscles are intact. No scleral icterus. No conjunctivitis. Oral mucosa is dry. No pallor.   NECK: No thyroid tenderness, enlargement or nodules. Neck is supple. No masses,  nontender. No adenopathy. No JVD. No carotid bruit.   CHEST: Bilateral breath sounds are clear. No wheeze. Normal effort. No respiratory distress.   HEART: Heart sounds are regular. No murmur. Good peripheral pulses. No lower extremity edema.   ABDOMEN: Soft, nontender. Normal bowel sounds. No hepatosplenomegaly. No bruit. No masses.   RECTAL: Exam is deferred.   NEUROLOGICAL: She is awake and alert. She is oriented to time, place, and person. Cranial nerves are intact. Moving all extremities against gravity. No cyanosis, no clubbing.   SKIN: No rash. No lesions.   LABORATORY, DIAGNOSTIC AND RADIOLOGICAL DATA:  She has a white count of 10.3, hemoglobin of 15.4, platelet count of 315,000.  Her complete metabolic panel is pending at this time.  Her glucose was found to be 268.  Previous blood cultures have been negative.   IMPRESSION:  1. Nausea, vomiting, diarrhea, may be gastroenteritis, rule out any C. difficile colitis. 2. Dehydration with orthostatic hypotension.  3. Hyperglycemia with diabetes secondary to steroids. 4. Recent diagnosis of acute pharyngitis. 5. History of hypertension, now hypotension.  6. Anxiety.   PLAN: The patient is a 44 year old female with history of diabetes, hypertension, anxiety. She has been sick for the last two weeks with acute pharyngitis. She was following with Dr. Pryor Ochoa. Her strep throat was negative. She was admitted as a direct admit from Dr. Darien Ramus office last admission from March 6th to March 8th because of nausea, vomiting, unable to tolerate p.o. She improved and she was discharged on March 8th. Again she started having nausea, vomiting, diarrhea. I am going to stool studies on her. She already completed about 7 to 8 days of antibiotic. I am going to stop that. I will give her IV hydration. She has been orthostatic. She was hypotensive at Dr. Lupita Dawn office.  She is presyncopal. I am going to give her a 500 mL  normal saline bolus and then give  her aggressive IV hydration, give her IV antiemetics. We will check a urinalysis on her also. We will check a complete metabolic panel for electrolytes on her also. I am going to hold her metformin at this time because of the diarrhea. I am going to hold lisinopril because of her hypotension at this time. I will start her with a clear liquid diet. I will cut down her Lantus because she is not on a regular diet right now. Continue sliding scale. Her prednisone taper, two days are left. I am going to complete two more days of prednisone and then stop.   TIME SPENT:   Time spent with admission and coordination of care was 45 minutes.    ____________________________ Mena Pauls, MD ag:cbb D: 11/27/2011 16:07:27 ET T: 11/27/2011 17:00:07 ET JOB#: [id number]  cc: Mena Pauls, MD, <Dictator> Deborra Medina, MD Mena Pauls MD ELECTRONICALLY SIGNED 12/19/2011 16:18

## 2015-01-09 NOTE — Consult Note (Signed)
PATIENT NAME:  [name], [name] MR#:  [id number] DATE OF BIRTH:  March 31, 1971  DATE OF CONSULTATION:  11/28/2011  CONSULTING PHYSICIAN:  Theodore Demark, NP PRIMARY CARE PHYSICIAN: Deborra Medina, MD   HISTORY OF PRESENT ILLNESS: Ms. [name] is a 44 year old Caucasian female with a history of diabetes, hypertension, anxiety, and recent history of pharyngitis treated with Augmentin and prednisone, admitted for nausea and vomiting and diarrhea. She states that she was admitted last week with viral pharyngitis, got better and went home. She started with nausea, vomiting, diarrhea, and a low-grade fever two days ago Monday. She was unable to keep anything down. She returned to the ED with mild dehydration and was readmitted. C. difficile has been negative. Her stool for white blood cells has been negative. Augmentin has been stopped. She reported that her vomiting has improved, none today. She states it was yellowish in color. Nausea is better. Diarrhea has improved. She has had one stool today, had been having 3 to 4 loose yellow stools daily. She denies abdominal pain. She denies further fever. She does report that she has had no known other sick exposures. She uses well water, is unsure when the last time it was sanitized. She has two dogs and one cat and frequently eats at restaurants. No foreign travel or camping. No prior luminal evaluation. Of note, last A1c was 10.1. She denies further GI-related symptoms.   PAST MEDICAL/SURGICAL HISTORY:  1. Diabetes. 2. Hypertension. 3. Anxiety.  4. Cesarean section.   ALLERGIES: No known drug allergies.    HOME MEDICATIONS:  1. Xanax 0.5 mg p.o. b.i.d.  2. Metformin 1000 mg p.o. b.i.d.  3. Zoloft 100 mg p.o. daily.  4. NovoLog insulin sliding scale.  5. Lantus 55 units p.o. at bedtime.  6. Lisinopril 5 mg p.o. daily.   Additional short-term medications included: 1. Augmentin 875 mg p.o. b.i.d.  2. Fioricet p.r.n.  3. Phenergan p.r.n.  4. Prednisone  taper. 5. Chloraseptic Spray p.r.n.   SOCIAL HISTORY: She does work here in [employer]. No smoking, alcohol, or illicits.   FAMILY HISTORY: Pertinent for diabetes, heart disease, hypertension, hyperlipidemia. Negative for colorectal cancer, liver disease, peptic ulcer disease.    REVIEW OF SYSTEMS: CONSTITUTIONAL: Some fatigue, weight loss. No recent fevers. PSYCHIATRIC: No complaints of anxiety at present or depression. HEENT: No dizziness at present. No headaches. No vision changes. No hearing loss. No problems swallowing. RESPIRATORY: No cough, wheeze, shortness of breath. CARDIAC: No chest pain, dyspnea on exertion, edema, murmur, tachycardia. GI: As noted. GU: No dysuria or frequency. ENDOCRINE: No history of thyroid problems, temperature intolerances. No polyuria, polydipsia, polyphagia at present. HEMATOLOGIC: No history of anemia. No easy bruisability or bleeding. INTEGUMENTARY: No erythema, lesion or rash. MUSCULOSKELETAL: No unusual joint pains or difficulty using extremities. No complaints of back pain. NEUROLOGIC: No history of stroke. No numbness, tingling, fainting, dizziness or seizures.   LABORATORY, DIAGNOSTIC AND RADIOLOGICAL DATA:  Most recent lab results: Serum glucose 231, BUN 17, creatinine 0.57, serum sodium 135, potassium 4.4, chloride 101, GFR greater than 60, calcium 8.3, total serum protein 8.7, serum albumin 3.3, total bilirubin 0.5, ALT 51, AST 59, ALT 41. WBC 10.3, hemoglobin 15.4, hematocrit 45.8, platelets 315, normocytosis.  C. difficile this morning was negative. No white blood cells or red blood cells in her stool this morning.  Urinalysis is negative for signs of urinary tract infection.   PHYSICAL EXAMINATION:  VITAL SIGNS: Most recent vital signs: Temperature 98.2, pulse 67, respiratory rate  18, blood pressure 111/72, SaO2 97% on room air. Fingersticks have been running in the 200s.   GENERAL: A well-appearing obese Caucasian female resting  comfortably in bed in no acute distress.   PSYCHIATRIC: Pleasant, cooperative, mood stable, thoughts logical, good memory.   HEENT: Normocephalic, atraumatic. No scleral icterus. No redness or drainage or inflammation to the eyes or nares. Oral mucous membranes are pink and moist.   NECK: Trachea midline. No thyromegaly, lymphadenopathy or JVD.   RESPIRATORY: Respirations are eupneic. Lungs are clear to auscultation bilaterally.   CARDIAC: S1, S2. Regular rate and rhythm. No MRG. Peripheral pulses 2+. No edema.   ABDOMEN: Rounded abdomen, active bowel sounds x4. Soft, nontender. No hepatosplenomegaly, masses, bruits, hernias, rebound, tenderness, or other peritoneal signs.   RECTAL: Deferred.   GENITOURINARY: Deferred.   NEUROLOGICAL: Alert and oriented x3. Speech clear. No facial droop.   SKIN: No erythema, lesions or rash.   EXTREMITIES: No clubbing, cyanosis, or edema. Moves all extremities well x4. Sensation intact.   IMPRESSION/RECOMMENDATIONS: Nausea and vomiting, diarrhea, possibly Augmentin side effect, viral exposure, or other infectious agent. Less likely gastroparesis at this point. We will complete stool studies, also would like to repeat Clostridium difficile. We will continue with clears and advance diet slowly as tolerated.   These services were provided by Stephens November, MSN, [facility] in collaboration with Dr. Verdie Shire. ____________________________ Theodore Demark, NP chl:cbb D: 11/28/2011 17:35:00 ET T: 11/28/2011 17:45:08 ET JOB#: [id number] [place] FNP ELECTRONICALLY SIGNED 11/29/2011 8:41

## 2015-03-07 ENCOUNTER — Telehealth: Payer: Self-pay | Admitting: Internal Medicine

## 2015-03-07 NOTE — Telephone Encounter (Signed)
Patient has not been seen for uncontrolled DM since paril 2015.  Please find her a 30 minute slot next week  And ask her to have fasting labs done prior to the visit

## 2015-03-07 NOTE — Telephone Encounter (Signed)
Patient Name: [name] DOB: [birth year] Initial Comment Caller states she is diabetic, having foot pain and pin sensation from knee down Nurse Assessment Nurse: Vallery Sa, RN, [place] Date/Time (Eastern Time): 03/07/2015 2:11:37 PM Confirm and document reason for call. If symptomatic, describe symptoms. ---Caller states she developed pain in both legs and feet about a month ago that became worse 3 days ago. No fever. No injury in the past 3 days. No sores. Has the patient traveled out of the country within the last 30 days? ---No Does the patient require triage? ---Yes Related visit to physician within the last 2 weeks? ---No Does the PT have any chronic conditions? (i.e. diabetes, asthma, etc.) ---Yes List chronic conditions. ---Diabetes, Depression Did the patient indicate they were pregnant? ---No Guidelines Guideline Title Affirmed Question Affirmed Notes Leg Pain [1] SEVERE pain (e.g., excruciating, unable to do any normal activities) AND [2] not improved after 2 hours of pain medicine Diabetes - Foot Problems and Questions Severe pain Final Disposition User Go to ED Now Vallery Sa, RN, [place] She plans to go to [facility] ER.

## 2015-03-07 NOTE — Telephone Encounter (Signed)
Going to ER [facility]

## 2015-03-08 NOTE — Telephone Encounter (Signed)
Patient scheduled for 03/17/15 patient was advised to go to ER if pain becomes sharpe in nature or any swelling. Patient decline ER at this time due to no insurance and pain reoccurring from diabetic neuropathy.

## 2015-03-08 NOTE — Telephone Encounter (Deleted)
Sympathy card mailed 

## 2015-03-08 NOTE — Telephone Encounter (Signed)
Phone has been disconnected 

## 2015-03-17 ENCOUNTER — Encounter (INDEPENDENT_AMBULATORY_CARE_PROVIDER_SITE_OTHER): Payer: Self-pay

## 2015-03-17 ENCOUNTER — Ambulatory Visit (INDEPENDENT_AMBULATORY_CARE_PROVIDER_SITE_OTHER): Payer: Self-pay | Admitting: Internal Medicine

## 2015-03-17 ENCOUNTER — Encounter: Payer: Self-pay | Admitting: Internal Medicine

## 2015-03-17 VITALS — BP 120/80 | HR 82 | Temp 98.6°F | Resp 16 | Ht 64.0 in | Wt 169.5 lb

## 2015-03-17 DIAGNOSIS — E1142 Type 2 diabetes mellitus with diabetic polyneuropathy: Secondary | ICD-10-CM

## 2015-03-17 DIAGNOSIS — E1165 Type 2 diabetes mellitus with hyperglycemia: Secondary | ICD-10-CM

## 2015-03-17 DIAGNOSIS — E1140 Type 2 diabetes mellitus with diabetic neuropathy, unspecified: Secondary | ICD-10-CM

## 2015-03-17 DIAGNOSIS — IMO0002 Reserved for concepts with insufficient information to code with codable children: Secondary | ICD-10-CM

## 2015-03-17 DIAGNOSIS — F411 Generalized anxiety disorder: Secondary | ICD-10-CM

## 2015-03-17 DIAGNOSIS — H01006 Unspecified blepharitis left eye, unspecified eyelid: Secondary | ICD-10-CM

## 2015-03-17 LAB — COMPREHENSIVE METABOLIC PANEL
ALT: 26 U/L (ref 0–35)
AST: 29 U/L (ref 0–37)
Albumin: 3.8 g/dL (ref 3.5–5.2)
Alkaline Phosphatase: 80 U/L (ref 39–117)
BILIRUBIN TOTAL: 0.3 mg/dL (ref 0.2–1.2)
BUN: 11 mg/dL (ref 6–23)
CHLORIDE: 101 meq/L (ref 96–112)
CO2: 29 mEq/L (ref 19–32)
Calcium: 9.7 mg/dL (ref 8.4–10.5)
Creatinine, Ser: 0.54 mg/dL (ref 0.40–1.20)
GFR: 129.28 mL/min (ref >60.00)
Glucose, Bld: 277 mg/dL — ABNORMAL HIGH (ref 70–99)
Potassium: 4.8 mEq/L (ref 3.5–5.1)
Sodium: 138 mEq/L (ref 135–145)
TOTAL PROTEIN: 7.2 g/dL (ref 6.0–8.3)

## 2015-03-17 LAB — LDL CHOLESTEROL, DIRECT: LDL DIRECT: 124 mg/dL

## 2015-03-17 LAB — MICROALBUMIN / CREATININE URINE RATIO
CREATININE, U: 31.1 mg/dL
Microalb Creat Ratio: 2.3 mg/g (ref 0.0–30.0)
Microalb, Ur: <0.7 mg/dL (ref 0.0–1.9)

## 2015-03-17 LAB — HEMOGLOBIN A1C: HEMOGLOBIN A1C: 14 % — AB (ref 4.6–6.5)

## 2015-03-17 MED ORDER — GABAPENTIN 300 MG PO CAPS: 300.0000 mg | ORAL_CAPSULE | Freq: Every day | ORAL | Status: DC

## 2015-03-17 MED ORDER — TRAMADOL HCL 50 MG PO TABS: 50.0000 mg | ORAL_TABLET | Freq: Three times a day (TID) | ORAL | Status: DC | PRN

## 2015-03-17 MED ORDER — ERYTHROMYCIN 5 MG/GM OP OINT: 1.0000 "application " | TOPICAL_OINTMENT | Freq: Three times a day (TID) | OPHTHALMIC | Status: DC

## 2015-03-17 MED ORDER — ALPRAZOLAM 0.5 MG PO TABS: 0.5000 mg | ORAL_TABLET | Freq: Every evening | ORAL | Status: DC | PRN

## 2015-03-17 MED ORDER — INSULIN NPH (HUMAN) (ISOPHANE) 100 UNIT/ML ~~LOC~~ SUSP: 15.0000 [IU] | Freq: Every day | SUBCUTANEOUS | Status: DC

## 2015-03-17 MED ORDER — GLIPIZIDE 10 MG PO TABS
10.0000 mg | ORAL_TABLET | Freq: Two times a day (BID) | ORAL | Status: DC
Start: 2015-03-17 — End: 2016-05-24

## 2015-03-17 MED ORDER — SERTRALINE HCL 50 MG PO TABS: 50.0000 mg | ORAL_TABLET | Freq: Every day | ORAL | Status: DC

## 2015-03-17 MED ORDER — "INSULIN SYRINGE/NEEDLE 28G X 1/2"" 1 ML MISC": 1.0000 | Freq: Every day | Status: AC

## 2015-03-17 NOTE — Progress Notes (Signed)
Subjective:  Patient ID: [name], female    DOB: 1970-10-06  Age: 44 y.o. 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 Diagnoses of Blepharitis of eyelid of left eye, Type 2 diabetes mellitus, uncontrolled, with neuropathy, and GAD (generalized anxiety disorder) were also pertinent to this visit.  [name] presents for diabetes follow up.   Last seen over a year ago due to loss of insurance .  She has not been checking her blood sugars and has had difficulty  Affording medication .    Blepharitis :  She has had a swollen eyelid for about a week.  Started with itching of eyellid and eyes.   Legs and feet burning for the past month.  Worse at night.     Outpatient Prescriptions Prior to Visit  Medication Sig Dispense Refill  . doxycycline (VIBRAMYCIN) 100 MG capsule Take 1 capsule (100 mg total) by mouth 2 (two) times daily. (Patient not taking: Reported on 03/17/2015) 14 capsule 0  . fluconazole (DIFLUCAN) 150 MG tablet Take 1 tablet (150 mg total) by mouth daily. (Patient not taking: Reported on 03/17/2015) 2 tablet 0  . glucose blood test strip Use as instructed up to 4 times daily (Patient not taking: Reported on 03/17/2015) 100 each 12  . Insulin Pen Needle 31G X 6 MM MISC Patient test blood sugar two times daily. (Patient not taking: Reported on 03/17/2015) 100 each 5  . Lancets MISC Patient test blood sugar two times daily. (Patient not taking: Reported on 03/17/2015) 100 each 5  . sertraline (ZOLOFT) 100 MG tablet TAKE ONE TABLET BY MOUTH ONCE DAILY (Patient not taking: Reported on 03/17/2015) 30 tablet 2  . ALPRAZolam (XANAX) 0.5 MG tablet TAKE ONE TABLET BY MOUTH AT BEDTIME AS NEEDED FOR SLEEP OR ANXIETY (Patient not taking: Reported on 03/17/2015) 30 tablet 0  . gabapentin (NEURONTIN) 300 MG capsule Take 300 mg twice daily (Patient not taking: Reported on 03/17/2015) 60 capsule 2  . glipiZIDE  (GLUCOTROL) 5 MG tablet TAKE ONE TABLET BY MOUTH TWICE DAILY BEFORE A MEAL (Patient not taking: Reported on 03/17/2015) 60 tablet 0  . INS SYRINGE/NEEDLE 1CC/28G (B-D INSULIN SYRINGE 1CC/28G) 28G X 1/2" 1 ML MISC 1 Syringe by Does not apply route daily after supper. (Patient not taking: Reported on 03/17/2015) 100 each 0  . insulin NPH Human (NOVOLIN N) 100 UNIT/ML injection Inject 0.15 mLs (15 Units total) into the skin at bedtime. (Patient not taking: Reported on 03/17/2015) 10 mL 11  . sertraline (ZOLOFT) 100 MG tablet TAKE ONE TABLET BY MOUTH ONCE DAILY (Patient not taking: Reported on 03/17/2015) 30 tablet 0  . traMADol (ULTRAM) 50 MG tablet Take 1 tablet (50 mg total) by mouth every 8 (eight) hours as needed for pain. (Patient not taking: Reported on 03/17/2015) 45 tablet 0   No facility-administered medications prior to visit.    Review of Systems;  Patient denies headache, fevers, malaise, unintentional weight loss, skin rash, eye pain, sinus congestion and sinus pain, sore throat, dysphagia,  hemoptysis , cough, dyspnea, wheezing, chest pain, palpitations, orthopnea, edema, abdominal pain, nausea, melena, diarrhea, constipation, flank pain, dysuria, hematuria, urinary  Frequency, nocturia, numbness, tingling, seizures,  Focal weakness, Loss of consciousness,  Tremor, insomnia, depression, anxiety, and suicidal ideation.      Objective:  BP 120/80 mmHg  Pulse 82  Temp(Src) 98.6 F (37 C) (Oral)  Resp 16  Ht  _0  (1.626 m)  Wt 169 lb 8 oz (76.885 kg)  BMI 29.08 kg/m2  SpO2 98%  BP Readings from Last 3 Encounters:  03/17/15 120/80  12/17/13 124/82  06/24/13 110/66    Wt Readings from Last 3 Encounters:  03/17/15 169 lb 8 oz (76.885 kg)  12/17/13 182 lb (82.555 kg)  06/24/13 187 lb (84.823 kg)    General appearance: alert, cooperative and appears stated age Ears: normal TM's and external ear canals both ears Throat: lips, mucosa, and tongue normal; teeth and gums  normal Neck: no adenopathy, no carotid bruit, supple, symmetrical, trachea midline and thyroid not enlarged, symmetric, no tenderness/mass/nodules Back: symmetric, no curvature. ROM normal. No CVA tenderness. Lungs: clear to auscultation bilaterally Heart: regular rate and rhythm, S1, S2 normal, no murmur, click, rub or gallop Abdomen: soft, non-tender; bowel sounds normal; no masses,  no organomegaly Pulses: 2+ and symmetric Skin: Skin color, texture, turgor normal. No rashes or lesions Lymph nodes: Cervical, supraclavicular, and axillary nodes normal.  Lab Results  Component Value Date   HGBA1C 14.0* 03/17/2015   HGBA1C 16.1* 12/17/2013   HGBA1C 14.3* 04/13/2013    Lab Results  Component Value Date   CREATININE 0.54 03/17/2015   CREATININE 0.80 09/29/2014   CREATININE 0.69 01/30/2014    Lab Results  Component Value Date   WBC 6.7 09/29/2014   HGB 15.1 09/29/2014   HCT 44.3 09/29/2014   PLT 229 09/29/2014   GLUCOSE 277* 03/17/2015   CHOL 209* 12/17/2013   TRIG 306.0* 12/17/2013   HDL 37.40* 12/17/2013   LDLDIRECT 124.0 03/17/2015   LDLCALC 110* 12/17/2013   ALT 26 03/17/2015   AST 29 03/17/2015   NA 138 03/17/2015   K 4.8 03/17/2015   CL 101 03/17/2015   CREATININE 0.54 03/17/2015   BUN 11 03/17/2015   CO2 29 03/17/2015   HGBA1C 14.0* 03/17/2015   MICROALBUR <0.7 03/17/2015    No results found.  Assessment & Plan:   Problem List Items Addressed This Visit      Unprioritized   Type 2 diabetes mellitus, uncontrolled, with neuropathy    advised to resume glipizide,  Adding NPH 15 units at bedtime.  Insulin administration was demonstrated. Return weekly for insulin adjustment .   Gabapentin added for new onset neuropathy..  Lab Results  Component Value Date   HGBA1C 14.0* 03/17/2015      Patient advised that given her financial problems, she may want to establish herself with the [facility] or the [facility] in order to receive some assistance with  medication samples.        Relevant Medications   insulin NPH Human (NOVOLIN N) 100 UNIT/ML injection   glipiZIDE (GLUCOTROL) 10 MG tablet   Blepharitis of eyelid of left eye    Empiric antibacter ial ophthalmic  solution       GAD (generalized anxiety disorder)    Secondary to home and financial stressors.  Zoloft and alprazolam  prescribed.  The risks and benefits of benzodiazepine use were discussed with patient today including excessive sedation leading to respiratory depression,  impaired thinking/driving, and addiction.  Patient was advised to avoid concurrent use with alcohol, to use medication only as needed and not to share with others  .        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 Suminski's sertraline, glipiZIDE, gabapentin, ALPRAZolam, and traMADol. I am also having her start on erythromycin. Additionally, I am having her maintain her Lancets, Insulin Pen Needle, glucose blood, doxycycline, fluconazole, sertraline, insulin NPH Human, and INS SYRINGE/NEEDLE 1CC/28G.  Meds ordered this encounter  Medications  . sertraline (ZOLOFT) 50 MG tablet    Sig: Take 1 tablet (50 mg total) by mouth daily.    Dispense:  90 tablet    Refill:  1  . insulin NPH Human (NOVOLIN N) 100 UNIT/ML injection    Sig: Inject 0.15 mLs (15 Units total) into the skin at bedtime.    Dispense:  10 mL    Refill:  11  . glipiZIDE (GLUCOTROL) 10 MG tablet    Sig: Take 1 tablet (10 mg total) by mouth 2 (two) times daily before a meal.    Dispense:  180 tablet    Refill:  1  . gabapentin (NEURONTIN) 300 MG capsule     Sig: Take 1 capsule (300 mg total) by mouth at bedtime. Take 300 mg twice daily    Dispense:  90 capsule    Refill:  1  . ALPRAZolam (XANAX) 0.5 MG tablet    Sig: Take 1 tablet (0.5 mg total) by mouth at bedtime as needed for anxiety. TAKE ONE TABLET BY MOUTH AT BEDTIME AS NEEDED FOR SLEEP OR ANXIETY    Dispense:  30 tablet    Refill:  0  . traMADol (ULTRAM) 50 MG tablet    Sig: Take 1 tablet (50 mg total) by mouth every 8 (eight) hours as needed.    Dispense:  90 tablet    Refill:  3  . INS SYRINGE/NEEDLE 1CC/28G (B-D INSULIN SYRINGE 1CC/28G) 28G X 1/2" 1 ML MISC    Sig: 1 Syringe by Does not apply route daily after supper.    Dispense:  100 each    Refill:  0  . erythromycin (ROMYCIN) ophthalmic ointment    Sig: Place 1 application into the left eye 3 (three) times daily.    Dispense:  3.5 g    Refill:  0    Medications Discontinued During This Encounter  Medication Reason  . sertraline (ZOLOFT) 100 MG tablet Reorder  . insulin NPH Human (NOVOLIN N) 100 UNIT/ML injection Reorder  . glipiZIDE (GLUCOTROL) 5 MG tablet Reorder  . gabapentin (NEURONTIN) 300 MG capsule Reorder  . ALPRAZolam (XANAX) 0.5 MG tablet Reorder  . traMADol (ULTRAM) 50 MG tablet Reorder  . INS SYRINGE/NEEDLE 1CC/28G (B-D INSULIN SYRINGE 1CC/28G) 28G X 1/2" 1 ML MISC Reorder    Follow-up: No Follow-up on file.   Crecencio Mc, MD

## 2015-03-17 NOTE — Patient Instructions (Signed)
Resume glipizde at 10 mg twice daily , Resume NPH insulin 15 units at bedtime   Gabapentin at bedtime for neuropathy Tramadol for daytime pain IF NEEDED,  NOT MORE THAN ONCE DAILY   sertarline and alprazolam for anxeity

## 2015-03-17 NOTE — Progress Notes (Signed)
Pre-visit discussion using our clinic review tool. No additional management support is needed unless otherwise documented below in the visit note.  

## 2015-03-27 DIAGNOSIS — F411 Generalized anxiety disorder: Secondary | ICD-10-CM | POA: Insufficient documentation

## 2015-03-27 NOTE — Assessment & Plan Note (Signed)
Secondary to home and financial stressors.  Zoloft and alprazolam  prescribed.  The risks and benefits of benzodiazepine use were discussed with patient today including excessive sedation leading to respiratory depression,  impaired thinking/driving, and addiction.  Patient was advised to avoid concurrent use with alcohol, to use medication only as needed and not to share with others  .

## 2015-03-27 NOTE — Assessment & Plan Note (Signed)
Empiric antibacter ial ophthalmic  solution

## 2015-03-27 NOTE — Assessment & Plan Note (Signed)
advised to resume glipizide,  Adding NPH 15 units at bedtime.  Insulin administration was demonstrated. Return weekly for insulin adjustment .   Gabapentin added for new onset neuropathy..  Lab Results  Component Value Date   HGBA1C 14.0* 03/17/2015      Patient advised that given her financial problems, she may want to establish herself with the [facility] or the [facility] in order to receive some assistance with medication samples.

## 2015-03-31 ENCOUNTER — Telehealth: Payer: Self-pay | Admitting: *Deleted

## 2015-03-31 ENCOUNTER — Telehealth: Payer: Self-pay | Admitting: Internal Medicine

## 2015-03-31 DIAGNOSIS — IMO0002 Reserved for concepts with insufficient information to code with codable children: Secondary | ICD-10-CM

## 2015-03-31 DIAGNOSIS — E1165 Type 2 diabetes mellitus with hyperglycemia: Principal | ICD-10-CM

## 2015-03-31 DIAGNOSIS — E1140 Type 2 diabetes mellitus with diabetic neuropathy, unspecified: Secondary | ICD-10-CM

## 2015-03-31 MED ORDER — GABAPENTIN 300 MG PO CAPS: 300.0000 mg | ORAL_CAPSULE | Freq: Three times a day (TID) | ORAL | Status: DC

## 2015-03-31 NOTE — Telephone Encounter (Signed)
Pt came into the clinic requesting a new Rx for Gabapentin to take during the day instead of in the evening.  Pt reports she has taken 2 tablets of Gabapentin in the am and 1 tablet of Gabapentin in the pm.  Pt states Gabapentin in more effective than the Tramadol and does not cause drowsiness.  Pt is also turning in a record of her blood sugars to the front desk after she rewrites them.  Please advise

## 2015-03-31 NOTE — Telephone Encounter (Signed)
Spoke with pt, advised of Rx and instructions.  Pt verbalized understanding

## 2015-03-31 NOTE — Telephone Encounter (Signed)
I have written the gabapentin for #90/month so she can take  It three times daily  Or twice daily however she prefers

## 2015-03-31 NOTE — Telephone Encounter (Signed)
Pt came in and dropped off her blood sugar readings.. Placed in Dr. Tullos box.. °

## 2015-03-31 NOTE — Telephone Encounter (Signed)
In yellow folder

## 2015-04-01 MED ORDER — INSULIN NPH (HUMAN) (ISOPHANE) 100 UNIT/ML ~~LOC~~ SUSP: SUBCUTANEOUS | Status: DC

## 2015-04-01 NOTE — Telephone Encounter (Signed)
Called patient to give new orders for insulin which was given and patient voiced understanding. Patient was very upset because she could not speak with nurse when she was in office. Patient stated she ask to speak with nurse specifically and was given to triage. Patient stated she is having trouble with medication keeping her up at night stated she has always taken the Xanax and Zoloft at bedtime and has slept well but since adding gabapentin she can only sleep 3 to 3.5 hours and she is awake patient also stated she feels she has gained 10 to 15 pounds since OV and restarting medications. Please advise.

## 2015-04-01 NOTE — Telephone Encounter (Signed)
The weight gain can be confirmed by weighing her here in the office.  It sounds excessive.  She can expect to gain some weight as she gets her diabetes under control because her body is no longer starving.  She'll need to start exercising daily. d she can increase the sertraline to 100 mg ,  Her previous dose and stop the gabapentin if she feels it is keeping her up, but the only other affordabel medication for neuropathy would be generic elavil taken once daily at bedtime.  Starting with a 25 mg dose. And increasing dose weekly by 25 mg to max dose of 100 mg

## 2015-04-01 NOTE — Telephone Encounter (Signed)
She submitted two days worth of BS readings.  Both fastings were 250.  One post prandial dinner was 357 and one post prandial breakfast was 300.    I will have her increase her bedtime insulin to 20 units and start taking 10 units in the morning .  Continue the glipizide 10 mg twice daily before breakfast and dinner .  Chart has been updated

## 2015-04-01 NOTE — Assessment & Plan Note (Signed)
She submitted two days worth of BS readings.  Both fastings were 250.  One post prandial dinner was 357 and one post prandial breakfast was 300.  I will have her increase her bedtime insulin to 20 units and start taking 10 units in the morning .  Continue the glipizide 10 mg twice daily before breakfast and dinner

## 2015-04-04 MED ORDER — AMITRIPTYLINE HCL 25 MG PO TABS: 25.0000 mg | ORAL_TABLET | Freq: Every day | ORAL | Status: DC

## 2015-04-04 NOTE — Telephone Encounter (Signed)
Patient would like to know can she still take the gabapentin during the day for leg pain, or would the Elavil for neuropathy help both day and night. Pain is worse during the day when patient is on her feet a lot.

## 2015-04-04 NOTE — Telephone Encounter (Signed)
Elavil is pretty sedating, so most people use it only at  Night .  Sh can use the gabaepntin for daytime pain .  i'll send it to pharmacy

## 2015-04-04 NOTE — Telephone Encounter (Signed)
Patient notified and voiced understanding.

## 2015-04-25 ENCOUNTER — Other Ambulatory Visit: Payer: Self-pay | Admitting: Internal Medicine

## 2015-04-26 NOTE — Telephone Encounter (Signed)
Last OV 6.30.16.  Please advise refill

## 2015-04-28 NOTE — Telephone Encounter (Signed)
Ok to refill,  Authorized in epic 

## 2015-05-02 NOTE — Telephone Encounter (Signed)
Rx faxed by Kathy 

## 2015-05-17 ENCOUNTER — Other Ambulatory Visit: Payer: Self-pay | Admitting: *Deleted

## 2015-05-17 MED ORDER — SERTRALINE HCL 100 MG PO TABS: ORAL_TABLET | ORAL | Status: DC

## 2015-06-01 ENCOUNTER — Other Ambulatory Visit: Payer: Self-pay

## 2015-06-01 MED ORDER — ALPRAZOLAM 0.5 MG PO TABS: ORAL_TABLET | ORAL | Status: DC

## 2015-06-18 DIAGNOSIS — N390 Urinary tract infection, site not specified: Secondary | ICD-10-CM

## 2015-06-18 HISTORY — DX: Urinary tract infection, site not specified: N39.0

## 2015-06-21 ENCOUNTER — Ambulatory Visit: Payer: Self-pay | Admitting: Internal Medicine

## 2015-06-27 ENCOUNTER — Emergency Department: Payer: Self-pay

## 2015-06-27 ENCOUNTER — Other Ambulatory Visit: Payer: Self-pay | Admitting: Internal Medicine

## 2015-06-27 ENCOUNTER — Encounter: Payer: Self-pay | Admitting: Emergency Medicine

## 2015-06-27 ENCOUNTER — Emergency Department
Admission: EM | Admit: 2015-06-27 | Discharge: 2015-06-28 | Disposition: A | Discharge status: Home / Self Care | Payer: Self-pay | Attending: Emergency Medicine | Admitting: Emergency Medicine

## 2015-06-27 DIAGNOSIS — R109 Unspecified abdominal pain: Secondary | ICD-10-CM

## 2015-06-27 DIAGNOSIS — Z794 Long term (current) use of insulin: Secondary | ICD-10-CM | POA: Insufficient documentation

## 2015-06-27 DIAGNOSIS — Z720 Tobacco use: Secondary | ICD-10-CM | POA: Insufficient documentation

## 2015-06-27 DIAGNOSIS — N12 Tubulo-interstitial nephritis, not specified as acute or chronic: Secondary | ICD-10-CM | POA: Insufficient documentation

## 2015-06-27 DIAGNOSIS — E1140 Type 2 diabetes mellitus with diabetic neuropathy, unspecified: Secondary | ICD-10-CM | POA: Insufficient documentation

## 2015-06-27 DIAGNOSIS — Z792 Long term (current) use of antibiotics: Secondary | ICD-10-CM | POA: Insufficient documentation

## 2015-06-27 DIAGNOSIS — Z3202 Encounter for pregnancy test, result negative: Secondary | ICD-10-CM | POA: Insufficient documentation

## 2015-06-27 DIAGNOSIS — Z79899 Other long term (current) drug therapy: Secondary | ICD-10-CM | POA: Insufficient documentation

## 2015-06-27 LAB — HEPATIC FUNCTION PANEL
ALBUMIN: 3.8 g/dL (ref 3.5–5.0)
ALT: 27 U/L (ref 14–54)
AST: 22 U/L (ref 15–41)
Alkaline Phosphatase: 110 U/L (ref 38–126)
BILIRUBIN TOTAL: 0.6 mg/dL (ref 0.3–1.2)
Bilirubin, Direct: <0.1 mg/dL — ABNORMAL LOW (ref 0.1–0.5)
TOTAL PROTEIN: 7.9 g/dL (ref 6.5–8.1)

## 2015-06-27 LAB — URINALYSIS COMPLETE WITH MICROSCOPIC (ARMC ONLY)
Bilirubin Urine: NEGATIVE
Glucose, UA: >500 mg/dL — AB
Nitrite: NEGATIVE
PROTEIN: NEGATIVE mg/dL
Specific Gravity, Urine: 1.037 — ABNORMAL HIGH (ref 1.005–1.030)
pH: 6 (ref 5.0–8.0)

## 2015-06-27 LAB — CBC
HEMATOCRIT: 40.7 % (ref 35.0–47.0)
HEMOGLOBIN: 13.4 g/dL (ref 12.0–16.0)
MCH: 27.7 pg (ref 26.0–34.0)
MCHC: 32.9 g/dL (ref 32.0–36.0)
MCV: 84.2 fL (ref 80.0–100.0)
Platelets: 281 10*3/uL (ref 150–440)
RBC: 4.83 MIL/uL (ref 3.80–5.20)
RDW: 14.6 % — AB (ref 11.5–14.5)
WBC: 8.3 10*3/uL (ref 3.6–11.0)

## 2015-06-27 LAB — BASIC METABOLIC PANEL
ANION GAP: 7 (ref 5–15)
BUN: 9 mg/dL (ref 6–20)
CALCIUM: 9.3 mg/dL (ref 8.9–10.3)
CO2: 26 mmol/L (ref 22–32)
Chloride: 98 mmol/L — ABNORMAL LOW (ref 101–111)
Creatinine, Ser: 0.7 mg/dL (ref 0.44–1.00)
GFR calc Af Amer: >60 mL/min (ref >60)
GLUCOSE: 480 mg/dL — AB (ref 65–99)
POTASSIUM: 3.3 mmol/L — AB (ref 3.5–5.1)
Sodium: 131 mmol/L — ABNORMAL LOW (ref 135–145)

## 2015-06-27 LAB — PREGNANCY, URINE: Preg Test, Ur: NEGATIVE

## 2015-06-27 LAB — LIPASE, BLOOD: Lipase: 46 U/L (ref 22–51)

## 2015-06-27 LAB — GLUCOSE, CAPILLARY: GLUCOSE-CAPILLARY: 433 mg/dL — AB (ref 65–99)

## 2015-06-27 LAB — TROPONIN I: Troponin I: <0.03 ng/mL (ref <0.031)

## 2015-06-27 MED ORDER — HYDROCODONE-ACETAMINOPHEN 5-325 MG PO TABS: 1.0000 | ORAL_TABLET | Freq: Four times a day (QID) | ORAL | Status: DC | PRN

## 2015-06-27 MED ORDER — ONDANSETRON HCL 4 MG/2ML IJ SOLN
4.0000 mg | INTRAMUSCULAR | Status: AC
  Administered 2015-06-27: 4 mg via INTRAVENOUS
  Filled 2015-06-27: qty 2

## 2015-06-27 MED ORDER — SODIUM CHLORIDE 0.9 % IV BOLUS (SEPSIS)
1000.0000 mL | Freq: Once | INTRAVENOUS | Status: AC
  Administered 2015-06-27: 1000 mL via INTRAVENOUS

## 2015-06-27 MED ORDER — ONDANSETRON 4 MG PO TBDP: 4.0000 mg | ORAL_TABLET | Freq: Four times a day (QID) | ORAL | Status: DC | PRN

## 2015-06-27 MED ORDER — CEPHALEXIN 500 MG PO CAPS: 500.0000 mg | ORAL_CAPSULE | Freq: Four times a day (QID) | ORAL | Status: DC

## 2015-06-27 MED ORDER — KETOROLAC TROMETHAMINE 30 MG/ML IJ SOLN
30.0000 mg | Freq: Once | INTRAMUSCULAR | Status: AC
  Administered 2015-06-27: 30 mg via INTRAVENOUS
  Filled 2015-06-27: qty 1

## 2015-06-27 MED ORDER — INSULIN ASPART 100 UNIT/ML ~~LOC~~ SOLN
6.0000 [IU] | Freq: Once | SUBCUTANEOUS | Status: AC
  Administered 2015-06-27: 6 [IU] via SUBCUTANEOUS
  Filled 2015-06-27: qty 6

## 2015-06-27 MED ORDER — DEXTROSE 5 % IV SOLN
2.0000 g | Freq: Once | INTRAVENOUS | Status: AC
  Administered 2015-06-27: 2 g via INTRAVENOUS
  Filled 2015-06-27: qty 2

## 2015-06-27 NOTE — Telephone Encounter (Signed)
Please advise 

## 2015-06-27 NOTE — ED Notes (Signed)
Pt back from [facility]

## 2015-06-27 NOTE — ED Provider Notes (Signed)
[facility] Emergency Department Provider Note REMINDER - THIS NOTE IS NOT A FINAL MEDICAL RECORD UNTIL IT IS SIGNED. UNTIL THEN, THE CONTENT BELOW MAY REFLECT INFORMATION FROM A DOCUMENTATION TEMPLATE, NOT THE ACTUAL PATIENT VISIT. ____________________________________________  Time seen: Approximately 9:14 PM  I have reviewed the triage vital signs and the nursing notes.   HISTORY  Chief Complaint Chest Pain    HPI [name] is a 44 y.o. female reports a previous history of depression and diabetes. She states that since about Thursday she's had discomfort under the right lower ribs and right breast that wraps around to her back. It is also been associated with some slight chills. She denies that she is really having "chest pain" but it's more pain in the right upper abdomen that shoots and radiates around. The pain had been fairly dull, but seemed to worsen significantly today.  She reports that she's been nauseated that she has vomited a few times. She denies pregnancy. No lower abdominal pain. No fever that she is aware of. She does report that she's had slight increase in urination and a burning sensation with urination over the last couple days as well.   Past Medical History  Diagnosis Date  . Diabetes mellitus   . allergic rhinitis   . Depression   . GERD (gastroesophageal reflux disease)   . History of migraine headaches   . tobacco abuse     Patient Active Problem List   Diagnosis Date Noted  . GAD (generalized anxiety disorder) 03/27/2015  . Blepharitis of eyelid of left eye 03/17/2015  . Bite, nonvenomous insect, face, neck, or scalp with infection 12/19/2013  . Pain 06/24/2013  . Gastroenteritis 12/03/2011  . Pharyngitis 11/19/2011  . Numbness and tingling of right arm and leg 09/18/2011  . Menometrorrhagia 08/21/2011  . Depression   . GERD (gastroesophageal reflux disease)   . History of migraine headaches   . Screening for  cervical cancer 07/18/2011  . Type 2 diabetes mellitus, uncontrolled, with neuropathy [facility])     Past Surgical History  Procedure Laterality Date  . Tubal ligation    . Cesarean section      x 2,  breech, premature 7 wks    Current Outpatient Rx  Name  Route  Sig  Dispense  Refill  . ALPRAZolam (XANAX) 0.5 MG tablet      TAKE ONE TABLET BY MOUTH AT BEDTIME AS NEEDED FOR ANXIETY   30 tablet   0   . amitriptyline (ELAVIL) 25 MG tablet   Oral   Take 1 tablet (25 mg total) by mouth at bedtime. Increase weekly by 25 mg as needed   90 tablet   3   . gabapentin (NEURONTIN) 300 MG capsule   Oral   Take 1 capsule (300 mg total) by mouth 3 (three) times daily.   90 capsule   1   . glipiZIDE (GLUCOTROL) 10 MG tablet   Oral   Take 1 tablet (10 mg total) by mouth 2 (two) times daily before a meal.   180 tablet   1   . INS SYRINGE/NEEDLE 1CC/28G (B-D INSULIN SYRINGE 1CC/28G) 28G X 1/2" 1 ML MISC   Does not apply   1 Syringe by Does not apply route daily after supper.   100 each   0   . insulin NPH Human (NOVOLIN N) 100 UNIT/ML injection      20 units after dinner and 10 units in the morning   10  mL   11   . sertraline (ZOLOFT) 100 MG tablet      TAKE ONE TABLET BY MOUTH ONCE DAILY   30 tablet   2   . sertraline (ZOLOFT) 50 MG tablet   Oral   Take 1 tablet (50 mg total) by mouth daily.   90 tablet   1   . traMADol (ULTRAM) 50 MG tablet   Oral   Take 1 tablet (50 mg total) by mouth every 8 (eight) hours as needed.   90 tablet   3   . doxycycline (VIBRAMYCIN) 100 MG capsule   Oral   Take 1 capsule (100 mg total) by mouth 2 (two) times daily. Patient not taking: Reported on 03/17/2015   14 capsule   0   . erythromycin (ROMYCIN) ophthalmic ointment   Left Eye   Place 1 application into the left eye 3 (three) times daily.   3.5 g   0   . fluconazole (DIFLUCAN) 150 MG tablet   Oral   Take 1 tablet (150 mg total) by mouth daily. Patient not taking: Reported  on 03/17/2015   2 tablet   0   . glucose blood test strip      Use as instructed up to 4 times daily Patient not taking: Reported on 03/17/2015   100 each   12   . Insulin Pen Needle 31G X 6 MM MISC      Patient test blood sugar two times daily. Patient not taking: Reported on 03/17/2015   100 each   5   . Lancets MISC      Patient test blood sugar two times daily. Patient not taking: Reported on 03/17/2015   100 each   5     Allergies Review of patient's allergies indicates no known allergies.  Family History  Problem Relation Age of Onset  . Hypertension Mother   . Heart disease Father   . Hyperlipidemia Father   . Stroke Father   . Diabetes Father   . Hypertension Father     Social History Social History  Substance Use Topics  . Smoking status: Current Some Day Smoker    Last Attempt to Quit: 07/17/2010  . Smokeless tobacco: Never Used  . Alcohol Use: No    Review of Systems Constitutional: Chills, fatigue Eyes: No visual changes. ENT: No sore throat. Cardiovascular: Denies "chest pain", states it is more like pain under the right ribs. Respiratory: Denies shortness of breath. Gastrointestinal:  No diarrhea.  No constipation. Genitourinary: See history of present illness Musculoskeletal: Negative for back pain except when pain is severe it wraps around to the mid back. Skin: Negative for rash. Neurological: Negative for headaches, focal weakness or numbness.  10-point ROS otherwise negative.  No leg swelling, no pain with deep inspiration. No recent traumas, surgery, coughing. No previous history of any "blood clots". Does not take any estrogens. ____________________________________________   PHYSICAL EXAM:  VITAL SIGNS: ED Triage Vitals  Enc Vitals Group     BP 06/27/15 2053 127/79 mmHg     Pulse Rate 06/27/15 2053 100     Resp 06/27/15 2053 18     Temp 06/27/15 2053 97.6 F (36.4 C)     Temp Source 06/27/15 2053 Oral     SpO2 06/27/15  2053 97 %     Weight 06/27/15 2053 175 lb (79.379 kg)     Height 06/27/15 2053 5\' 4"  (1.626 m)     Head Cir --  Peak Flow --      Pain Score 06/27/15 2049 10     Pain Loc --      Pain Edu? --      Excl. in [place]? --    Constitutional: Alert and oriented. Well appearing and in no acute distress. Calm and pleasant. Eyes: Conjunctivae are normal. PERRL. EOMI. Head: Atraumatic. Nose: No congestion/rhinnorhea. Mouth/Throat: Mucous membranes are moist.  Oropharynx non-erythematous. Neck: No stridor.   Cardiovascular: Normal rate, regular rhythm. Grossly normal heart sounds.  Good peripheral circulation. Respiratory: Normal respiratory effort.  No retractions. Lungs CTAB. Gastrointestinal: Soft and nontender except for moderate tenderness with Percell Miller, there is mild to moderate tenderness with palpation in the deep right upper quadrant and right flank. No distention. No abdominal bruits. There is moderate right-sided CVA tenderness. No tenderness on left. Musculoskeletal: No lower extremity tenderness nor edema.  No joint effusions. Neurologic:  Normal speech and language. No gross focal neurologic deficits are appreciated.Skin:  Skin is warm, dry and intact. No rash noted. Psychiatric: Mood and affect are normal. Speech and behavior are normal.  ____________________________________________   LABS (all labs ordered are listed, but only abnormal results are displayed)  Labs Reviewed  BASIC METABOLIC PANEL - Abnormal; Notable for the following:    Sodium 131 (*)    Potassium 3.3 (*)    Chloride 98 (*)    Glucose, Bld 480 (*)    All other components within normal limits  CBC - Abnormal; Notable for the following:    RDW 14.6 (*)    All other components within normal limits  URINALYSIS COMPLETEWITH MICROSCOPIC ([facility] ONLY) - Abnormal; Notable for the following:    Color, Urine STRAW (*)    APPearance HAZY (*)    Glucose, UA >500 (*)    Ketones, ur TRACE (*)    Specific Gravity, Urine  1.037 (*)    Hgb urine dipstick 1+ (*)    Leukocytes, UA TRACE (*)    Bacteria, UA RARE (*)    Squamous Epithelial / LPF 0-5 (*)    All other components within normal limits  HEPATIC FUNCTION PANEL - Abnormal; Notable for the following:    Bilirubin, Direct <0.1 (*)    All other components within normal limits  GLUCOSE, CAPILLARY - Abnormal; Notable for the following:    Glucose-Capillary 433 (*)    All other components within normal limits  URINE CULTURE  TROPONIN I  LIPASE, BLOOD  PREGNANCY, URINE  CBG MONITORING, ED   ____________________________________________  EKG  ED ECG REPORT I, Veroncia Jezek, the attending physician, personally viewed and interpreted this ECG.  Date: 06/27/2015 EKG Time: 2055 Rate: 95 Rhythm: normal sinus rhythm QRS Axis: normal Intervals: normal ST/T Wave abnormalities: normal Conduction Disutrbances: none Narrative Interpretation: unremarkable  ____________________________________________  RADIOLOGY   US Renal (Final result) Result time: 06/27/15 22:30:09   Final result by Rad Results In Interface (06/27/15 22:30:09)   Narrative:   CLINICAL DATA: Right flank pain and right upper quadrant pain for 4 days. Nausea and vomiting.  EXAM: RENAL / URINARY TRACT ULTRASOUND COMPLETE  COMPARISON: None.  FINDINGS: Right Kidney:  Length: 12.1 cm. Echogenicity within normal limits. No mass or hydronephrosis visualized.  Left Kidney:  Length: 12.4 cm. Echogenicity within normal limits. No mass or hydronephrosis visualized.  Bladder:  Appears normal for degree of bladder distention.  IMPRESSION: Normal ultrasound appearance of the kidneys and bladder.   Electronically Signed By: Lucienne Capers M.D. On: 06/27/2015 22:30  US Abdomen Limited RUQ (Final result) Result time: 06/27/15 22:25:35   Final result by Rad Results In Interface (06/27/15 22:25:35)   Narrative:   CLINICAL DATA: Right upper quadrant  pain and right flank pain for 4 days. Nausea and vomiting.  EXAM: US ABDOMEN LIMITED - RIGHT UPPER QUADRANT  COMPARISON: None.  FINDINGS: Gallbladder:  Gallbladder is diffusely filled with sludge. Small mobile stones are present. No gallbladder wall thickening. No edema. Murphy's sign is negative.  Common bile duct:  Diameter: 4.5 mm, normal  Liver:  No focal lesion identified. Within normal limits in parenchymal echogenicity.  IMPRESSION: Small stones and sludge in the gallbladder. No bile duct dilatation.   Electronically Signed By: Lucienne Capers M.D. On: 06/27/2015 22:25          DG Chest 2 View (Final result) Result time: 06/27/15 21:25:55   Final result by Rad Results In Interface (06/27/15 21:25:55)   Narrative:   CLINICAL DATA: mid CP radiating around under right breast and into back accomp by N/V since Thursday. Occasional smoker. Diabetic. No surgeries. Patient shielded.  EXAM: CHEST 2 VIEW  COMPARISON: 09/29/2014  FINDINGS: The heart size and mediastinal contours are within normal limits. Both lungs are clear. Degenerative changes in the spine.  IMPRESSION: No active cardiopulmonary disease.        DG Chest 2 View (Final result) Result time: 06/27/15 21:25:55   Final result by Rad Results In Interface (06/27/15 21:25:55)   Narrative:   CLINICAL DATA: mid CP radiating around under right breast and into back accomp by N/V since Thursday. Occasional smoker. Diabetic. No surgeries. Patient shielded.  EXAM: CHEST 2 VIEW  COMPARISON: 09/29/2014  FINDINGS: The heart size and mediastinal contours are within normal limits. Both lungs are clear. Degenerative changes in the spine.  IMPRESSION: No active cardiopulmonary disease.     ____________________________________________   PROCEDURES   Procedure(s) performed: None  Critical Care performed: No  ____________________________________________   INITIAL  IMPRESSION / ASSESSMENT AND PLAN / ED COURSE  Pertinent labs & imaging results that were available during my care of the patient were reviewed by me and considered in my medical decision making (see chart for details).  Presents with right upper abdominal pain and pain underneath the right lower rib cage. Associated with gastrointestinal symptoms of nausea and vomiting as well as chills and some dysuria. Differential would certainly include cholelithiasis/cystitis, pyelonephritis, I find file is likely acute cardiac as her EKG is quite normal, and really her symptoms are not that so much of chest pain as they are abdominal pain.  Overall her exam is reassuring without peritonitis, but does have focal right upper quadrant tenderness. No tenderness in the right lower quadrant. Plan is to obtain ultrasound imaging of the kidneys and gallbladder along with laboratory analysis, should this be negative that I would consider the possibility of CT. We will treat her with Toradol and Zofran, hydration and continue to follow her closely.  ----------------------------------------- 10:58 PM on 06/27/2015 ----------------------------------------- Patient resting comfortably, she reports improvement. I reviewed her results and in looking at her clinical history along with chills, right-sided CVA tenderness and right flank discomfort and would appear the patient likely has pyelonephritis. I am continuing to hydrate her, provide her with Zofran. She is able tolerate by mouth and was able eat a sandwich and hold fluids down today, but has been throwing up intermittently. She is diabetic, we will work to improve her blood sugar, and I discussed with the patient treatment recommendations. I  think she is appropriate for outpatient care, I'll provide her prescriptions for cephalexin, Zofran, and brief course of pain medication for which I did discuss risks benefits and appropriate use. Patient is very agreeable.  The  patient will follow-up closely with her primary care doctor, to come back to emergency room right away if she feels dehydrated, can't tolerate by mouth, feels her symptoms are worsening she is not improved in 24 hours.  I will prescribe the patient a narcotic pain medicine due to their condition which I anticipate will cause at least moderate pain short term. I discussed with the patient safe use of narcotic pain medicines, and that they are not to drive, work in dangerous areas, or ever take more than prescribed (no more than 1 pill every 6 hours). We discussed that this is the type of medication that "Alfonse Spruce" may have overdosed on and the risks of this type of medicine. Patient is very agreeable to only use as prescribed and to never use more than prescribed.  ----------------------------------------- 11:12 PM on 06/27/2015 -----------------------------------------  Ongoing care assigned to Dr. Dahlia Client. Plan is to give the patient additional liter of fluid, subcutaneous insulin, and after being well hydrated and blood sugar less than 300 to discharge her to home with prescriptions and close follow-up care. Working diagnosis pyelonephritis with associated hyperglycemia.  Patient does not presently meet any sepsis criteria, she is taking oral well, symptoms well controlled, blood sugar improving. ____________________________________________   FINAL CLINICAL IMPRESSION(S) / ED DIAGNOSES  Final diagnoses:  Right flank discomfort  Pyelonephritis      Delman Kitten, MD 06/27/15 2313

## 2015-06-27 NOTE — Discharge Instructions (Signed)
Is very important that you continue to keep your blood sugar under control, fill and take your antibiotic's, and get close appropriate follow-up care. Return to the emergency room right away if you feel dehydrated, having severe pain, you are unable to keep food down, if symptoms are not improving within 24 hours, if symptoms are worsening, or if other new concerns arise.  Do not drive while taking hydrocodone. Only use as prescribed.  Pyelonephritis, Adult Pyelonephritis is a kidney infection. The kidneys are the organs that filter a person's blood and move waste out of the bloodstream and into the urine. Urine passes from the kidneys, through the ureters, and into the bladder. There are two main types of pyelonephritis:  Infections that come on quickly without any warning (acute pyelonephritis).  Infections that last for a long period of time (chronic pyelonephritis). In most cases, the infection clears up with treatment and does not cause further problems. More severe infections or chronic infections can sometimes spread to the bloodstream or lead to other problems with the kidneys. CAUSES This condition is usually caused by:  Bacteria traveling from the bladder to the kidney through infected urine. The urine in the bladder can become infected with bacteria from:  Bladder infection (cystitis).  Inflammation of the prostate gland (prostatitis).  Sexual intercourse, in females.  Bacteria traveling from the bloodstream to the kidney. RISK FACTORS This condition is more likely to develop in:  Pregnant women.  Older people.  People who have diabetes.  People who have kidney stones or bladder stones.  People who have other abnormalities of the kidney or ureter.  People who have a catheter placed in the bladder.  People who have cancer.  People who are sexually active.  Women who use spermicides.  People who have had a prior urinary tract infection. SYMPTOMS Symptoms of this  condition include:  Frequent urination.  Strong or persistent urge to urinate.  Burning or stinging when urinating.  Abdominal pain.  Back pain.  Pain in the side or flank area.  Fever.  Chills.  Blood in the urine, or dark urine.  Nausea.  Vomiting. DIAGNOSIS This condition may be diagnosed based on:  Medical history and physical exam.  Urine tests.  Blood tests. You may also have imaging tests of the kidneys, such as an ultrasound or CT scan. TREATMENT Treatment for this condition may depend on the severity of the infection.  If the infection is mild and is found early, you may be treated with antibiotic medicines taken by mouth. You will need to drink fluids to remain hydrated.  If the infection is more severe, you may need to stay in the hospital and receive antibiotics given directly into a vein through an IV tube. You may also need to receive fluids through an IV tube if you are not able to remain hydrated. After your hospital stay, you may need to take oral antibiotics for a period of time. Other treatments may be required, depending on the cause of the infection. HOME CARE INSTRUCTIONS Medicines  Take over-the-counter and prescription medicines only as told by your health care provider.  If you were prescribed an antibiotic medicine, take it as told by your health care provider. Do not stop taking the antibiotic even if you start to feel better. General Instructions  Drink enough fluid to keep your urine clear or pale yellow.  Avoid caffeine, tea, and carbonated beverages. They tend to irritate the bladder.  Urinate often. Avoid holding in urine for  long periods of time.  Urinate before and after sex.  After a bowel movement, women should cleanse from front to back. Use each tissue only once.  Keep all follow-up visits as told by your health care provider. This is important. SEEK MEDICAL CARE IF:  Your symptoms do not get better after 2 days of  treatment.  Your symptoms get worse.  You have a fever. SEEK IMMEDIATE MEDICAL CARE IF:  You are unable to take your antibiotics or fluids.  You have shaking chills.  You vomit.  You have severe flank or back pain.  You have extreme weakness or fainting.   This information is not intended to replace advice given to you by your health care provider. Make sure you discuss any questions you have with your health care provider.   Document Released: 09/03/2005 Document Revised: 05/25/2015 Document Reviewed: 12/27/2014 Elsevier Interactive Patient Education [employer].

## 2015-06-27 NOTE — ED Notes (Signed)
Patient transported to Ultrasound 

## 2015-06-27 NOTE — ED Notes (Addendum)
Patient ambulatory to triage with steady gait, without difficulty or distress noted; pt reports mid CP radiating around under right breast and into back accomp by N/V since Thursday

## 2015-06-28 LAB — GLUCOSE, CAPILLARY
GLUCOSE-CAPILLARY: 345 mg/dL — AB (ref 65–99)
Glucose-Capillary: 238 mg/dL — ABNORMAL HIGH (ref 65–99)

## 2015-06-28 MED ORDER — FLUCONAZOLE 150 MG PO TABS: 150.0000 mg | ORAL_TABLET | Freq: Every day | ORAL | Status: DC

## 2015-06-28 MED ORDER — INSULIN ASPART 100 UNIT/ML ~~LOC~~ SOLN
4.0000 [IU] | Freq: Once | SUBCUTANEOUS | Status: AC
  Administered 2015-06-28: 4 [IU] via SUBCUTANEOUS
  Filled 2015-06-28: qty 4

## 2015-06-28 NOTE — ED Provider Notes (Signed)
-----------------------------------------   1:48 AM on 06/28/2015 -----------------------------------------   Blood pressure 124/80, pulse 98, temperature 97.6 F (36.4 C), temperature source Oral, resp. rate 17, height 5\' 4"  (1.626 m), weight 175 lb (79.379 kg), last menstrual period 06/05/2015, SpO2 98 %.  Assuming care from Dr. Jacqualine Code.  In short, [name] is a 44 y.o. female with a chief complaint of Chest Pain .  Refer to the original H&P for additional details.  The current plan of care is to follow up the patient's blood sugar and ensure that they improve.  Initial blood sugar in 300s after 2nd L of fluid and insulin. Patient given 4 more units of insulin and blood sugar improved to 200s. Patient will be discharged to home.    [name], MD 06/28/15 [phone]

## 2015-06-28 NOTE — ED Notes (Signed)
Patient with no complaints at this time. Respirations even and unlabored. Skin warm/dry. Discharge instructions reviewed with patient at this time. Patient given opportunity to voice concerns/ask questions. IV removed per policy and band-aid applied to site. Patient discharged at this time and left Emergency Department with steady gait.  

## 2015-06-28 NOTE — ED Notes (Signed)
Pt noted resting comfortably, watching television. Pt given remote per pt request.

## 2015-06-28 NOTE — Telephone Encounter (Signed)
Ok to refill,  printed rx   She is due for diabetes follow up.  Please schedule asap

## 2015-06-29 ENCOUNTER — Telehealth: Payer: Self-pay

## 2015-06-29 MED ORDER — FLUCONAZOLE 150 MG PO TABS: 150.0000 mg | ORAL_TABLET | Freq: Every day | ORAL | Status: DC

## 2015-06-29 NOTE — Addendum Note (Signed)
Addended by: Crecencio Mc on: 06/29/2015 10:55 AM   Modules accepted: Orders

## 2015-06-29 NOTE — Telephone Encounter (Signed)
Spoke with patient regarding a follow up appointment, has one scheduled on the 84 th.  She is concerned as she went to the ED on the 10 th and diagnosised with a UTI and yeast infection.  Was given antibiotics and 1 diflucan prescription even though the doctor was going to give her 2.  Can we refill the diflucan so that she can treat the yeast infection? Thanks

## 2015-06-29 NOTE — Telephone Encounter (Signed)
Spoke with the patient, refill sent.

## 2015-06-29 NOTE — Telephone Encounter (Signed)
Refill sent.

## 2015-06-29 NOTE — Telephone Encounter (Signed)
Called to confirm appointment on the 19th.

## 2015-06-30 LAB — URINE CULTURE
Culture: >=100000
SPECIAL REQUESTS: NORMAL

## 2015-07-02 ENCOUNTER — Emergency Department
Admission: EM | Admit: 2015-07-02 | Discharge: 2015-07-03 | Disposition: A | Discharge status: Home / Self Care | Payer: Self-pay | Attending: Emergency Medicine | Admitting: Emergency Medicine

## 2015-07-02 ENCOUNTER — Encounter: Payer: Self-pay | Admitting: Radiology

## 2015-07-02 ENCOUNTER — Emergency Department: Payer: Self-pay

## 2015-07-02 DIAGNOSIS — Z794 Long term (current) use of insulin: Secondary | ICD-10-CM | POA: Insufficient documentation

## 2015-07-02 DIAGNOSIS — Z7984 Long term (current) use of oral hypoglycemic drugs: Secondary | ICD-10-CM | POA: Insufficient documentation

## 2015-07-02 DIAGNOSIS — E1140 Type 2 diabetes mellitus with diabetic neuropathy, unspecified: Secondary | ICD-10-CM | POA: Insufficient documentation

## 2015-07-02 DIAGNOSIS — Z792 Long term (current) use of antibiotics: Secondary | ICD-10-CM | POA: Insufficient documentation

## 2015-07-02 DIAGNOSIS — Z79899 Other long term (current) drug therapy: Secondary | ICD-10-CM | POA: Insufficient documentation

## 2015-07-02 DIAGNOSIS — K8020 Calculus of gallbladder without cholecystitis without obstruction: Secondary | ICD-10-CM | POA: Insufficient documentation

## 2015-07-02 DIAGNOSIS — K8050 Calculus of bile duct without cholangitis or cholecystitis without obstruction: Secondary | ICD-10-CM

## 2015-07-02 DIAGNOSIS — Z3202 Encounter for pregnancy test, result negative: Secondary | ICD-10-CM | POA: Insufficient documentation

## 2015-07-02 DIAGNOSIS — Z720 Tobacco use: Secondary | ICD-10-CM | POA: Insufficient documentation

## 2015-07-02 LAB — COMPREHENSIVE METABOLIC PANEL
ALBUMIN: 3.5 g/dL (ref 3.5–5.0)
ALT: 22 U/L (ref 14–54)
ANION GAP: 6 (ref 5–15)
AST: 22 U/L (ref 15–41)
Alkaline Phosphatase: 102 U/L (ref 38–126)
BUN: 12 mg/dL (ref 6–20)
CHLORIDE: 100 mmol/L — AB (ref 101–111)
CO2: 27 mmol/L (ref 22–32)
Calcium: 9.2 mg/dL (ref 8.9–10.3)
Creatinine, Ser: 0.62 mg/dL (ref 0.44–1.00)
GFR calc Af Amer: >60 mL/min (ref >60)
GFR calc non Af Amer: >60 mL/min (ref >60)
GLUCOSE: 322 mg/dL — AB (ref 65–99)
POTASSIUM: 3.5 mmol/L (ref 3.5–5.1)
SODIUM: 133 mmol/L — AB (ref 135–145)
Total Bilirubin: 0.3 mg/dL (ref 0.3–1.2)
Total Protein: 7.4 g/dL (ref 6.5–8.1)

## 2015-07-02 LAB — URINALYSIS COMPLETE WITH MICROSCOPIC (ARMC ONLY)
BILIRUBIN URINE: NEGATIVE
Bacteria, UA: NONE SEEN
LEUKOCYTES UA: NEGATIVE
NITRITE: NEGATIVE
PROTEIN: NEGATIVE mg/dL
Specific Gravity, Urine: 1.028 (ref 1.005–1.030)
pH: 7 (ref 5.0–8.0)

## 2015-07-02 LAB — CBC
HEMATOCRIT: 38 % (ref 35.0–47.0)
HEMOGLOBIN: 12.9 g/dL (ref 12.0–16.0)
MCH: 28.4 pg (ref 26.0–34.0)
MCHC: 34 g/dL (ref 32.0–36.0)
MCV: 83.7 fL (ref 80.0–100.0)
Platelets: 271 10*3/uL (ref 150–440)
RBC: 4.54 MIL/uL (ref 3.80–5.20)
RDW: 14.8 % — AB (ref 11.5–14.5)
WBC: 7.2 10*3/uL (ref 3.6–11.0)

## 2015-07-02 LAB — LIPASE, BLOOD: LIPASE: 25 U/L (ref 22–51)

## 2015-07-02 LAB — POCT PREGNANCY, URINE: Preg Test, Ur: NEGATIVE

## 2015-07-02 MED ORDER — MORPHINE SULFATE (PF) 4 MG/ML IV SOLN
4.0000 mg | Freq: Once | INTRAVENOUS | Status: AC
  Administered 2015-07-02: 4 mg via INTRAVENOUS
  Filled 2015-07-02: qty 1

## 2015-07-02 MED ORDER — SODIUM CHLORIDE 0.9 % IV BOLUS (SEPSIS)
1000.0000 mL | Freq: Once | INTRAVENOUS | Status: AC
  Administered 2015-07-02: 1000 mL via INTRAVENOUS

## 2015-07-02 MED ORDER — ONDANSETRON HCL 4 MG/2ML IJ SOLN
4.0000 mg | Freq: Once | INTRAMUSCULAR | Status: AC
  Administered 2015-07-02: 4 mg via INTRAVENOUS
  Filled 2015-07-02: qty 2

## 2015-07-02 MED ORDER — IOHEXOL 350 MG/ML SOLN
75.0000 mL | Freq: Once | INTRAVENOUS | Status: AC | PRN
  Administered 2015-07-02: 75 mL via INTRAVENOUS

## 2015-07-02 NOTE — ED Provider Notes (Signed)
[facility] Emergency Department Provider Note  ____________________________________________  Time seen: Approximately 11:24 PM  I have reviewed the triage vital signs and the nursing notes.   HISTORY  Chief Complaint Chest Pain    HPI [name] is a 44 y.o. female who presents to the ED from home with a chief complaint of central chest and right upper quadrant pain. Patient states symptoms have been ongoing for approximately 1-1/2 weeks. She was evaluated in the ED on Monday for same. Found to have UTI as well as gallstones. States pain increased this evening approximately 5 PM. 8 a chili corn dog for lunch approximately 2 PM. Complains of sharp pain starting and central chest radiating to right upper quadrant and into her right back associated with nausea only. Denies shortness of breath, diaphoresis, vomiting, hematuria. States she has only taken her prescribed pain medicine twice since discharge from the ED. Denies recent travel or trauma. States nothing makes the pain better or worse.   Past Medical History  Diagnosis Date  . Diabetes mellitus   . allergic rhinitis   . Depression   . GERD (gastroesophageal reflux disease)   . History of migraine headaches   . tobacco abuse     Patient Active Problem List   Diagnosis Date Noted  . GAD (generalized anxiety disorder) 03/27/2015  . Blepharitis of eyelid of left eye 03/17/2015  . Bite, nonvenomous insect, face, neck, or scalp with infection 12/19/2013  . Pain 06/24/2013  . Gastroenteritis 12/03/2011  . Pharyngitis 11/19/2011  . Numbness and tingling of right arm and leg 09/18/2011  . Menometrorrhagia 08/21/2011  . Depression   . GERD (gastroesophageal reflux disease)   . History of migraine headaches   . Screening for cervical cancer 07/18/2011  . Type 2 diabetes mellitus, uncontrolled, with neuropathy [facility])     Past Surgical History  Procedure Laterality Date  . Tubal ligation    .  Cesarean section      x 2,  breech, premature 7 wks    Current Outpatient Rx  Name  Route  Sig  Dispense  Refill  . ALPRAZolam (XANAX) 0.5 MG tablet      TAKE ONE TABLET BY MOUTH AT BEDTIME AS NEEDED FOR ANXIETY   30 tablet   1   . amitriptyline (ELAVIL) 25 MG tablet   Oral   Take 1 tablet (25 mg total) by mouth at bedtime. Increase weekly by 25 mg as needed   90 tablet   3   . cephALEXin (KEFLEX) 500 MG capsule   Oral   Take 1 capsule (500 mg total) by mouth 4 (four) times daily.   40 capsule   0   . erythromycin (ROMYCIN) ophthalmic ointment   Left Eye   Place 1 application into the left eye 3 (three) times daily.   3.5 g   0   . fluconazole (DIFLUCAN) 150 MG tablet   Oral   Take 1 tablet (150 mg total) by mouth daily.   1 tablet   0   . fluconazole (DIFLUCAN) 150 MG tablet   Oral   Take 1 tablet (150 mg total) by mouth daily.   2 tablet   1   . gabapentin (NEURONTIN) 300 MG capsule   Oral   Take 1 capsule (300 mg total) by mouth 3 (three) times daily.   90 capsule   1   . glipiZIDE (GLUCOTROL) 10 MG tablet   Oral   Take 1 tablet (10  mg total) by mouth 2 (two) times daily before a meal.   180 tablet   1   . HYDROcodone-acetaminophen (NORCO/VICODIN) 5-325 MG tablet   Oral   Take 1 tablet by mouth every 6 (six) hours as needed for moderate pain.   10 tablet   0   . INS SYRINGE/NEEDLE 1CC/28G (B-D INSULIN SYRINGE 1CC/28G) 28G X 1/2" 1 ML MISC   Does not apply   1 Syringe by Does not apply route daily after supper.   100 each   0   . insulin NPH Human (NOVOLIN N) 100 UNIT/ML injection      20 units after dinner and 10 units in the morning   10 mL   11   . ondansetron (ZOFRAN ODT) 4 MG disintegrating tablet   Oral   Take 1 tablet (4 mg total) by mouth every 6 (six) hours as needed for nausea or vomiting.   20 tablet   0   . sertraline (ZOLOFT) 100 MG tablet      TAKE ONE TABLET BY MOUTH ONCE DAILY   30 tablet   2   . sertraline (ZOLOFT)  50 MG tablet   Oral   Take 1 tablet (50 mg total) by mouth daily.   90 tablet   1   . doxycycline (VIBRAMYCIN) 100 MG capsule   Oral   Take 1 capsule (100 mg total) by mouth 2 (two) times daily. Patient not taking: Reported on 03/17/2015   14 capsule   0   . fluconazole (DIFLUCAN) 150 MG tablet   Oral   Take 1 tablet (150 mg total) by mouth daily. Patient not taking: Reported on 03/17/2015   2 tablet   0   . glucose blood test strip      Use as instructed up to 4 times daily Patient not taking: Reported on 03/17/2015   100 each   12   . Insulin Pen Needle 31G X 6 MM MISC      Patient test blood sugar two times daily. Patient not taking: Reported on 03/17/2015   100 each   5   . Lancets MISC      Patient test blood sugar two times daily. Patient not taking: Reported on 03/17/2015   100 each   5   . traMADol (ULTRAM) 50 MG tablet   Oral   Take 1 tablet (50 mg total) by mouth every 8 (eight) hours as needed.   90 tablet   3     Allergies Review of patient's allergies indicates no known allergies.  Family History  Problem Relation Age of Onset  . Hypertension Mother   . Heart disease Father   . Hyperlipidemia Father   . Stroke Father   . Diabetes Father   . Hypertension Father     Social History Social History  Substance Use Topics  . Smoking status: Current Some Day Smoker    Last Attempt to Quit: 07/17/2010  . Smokeless tobacco: Never Used  . Alcohol Use: No    Review of Systems Constitutional: No fever/chills Eyes: No visual changes. ENT: No sore throat. Cardiovascular: Positive for chest pain. Respiratory: Denies shortness of breath. Gastrointestinal: Positive for abdominal pain.  Positive for nausea, no vomiting.  No diarrhea.  No constipation. Genitourinary: Negative for dysuria. Musculoskeletal: Negative for back pain. Skin: Negative for rash. Neurological: Negative for headaches, focal weakness or numbness.  10-point ROS otherwise  negative.  ____________________________________________   PHYSICAL EXAM:  VITAL SIGNS: ED  Triage Vitals  Enc Vitals Group     BP 07/02/15 1945 107/73 mmHg     Pulse Rate 07/02/15 1945 104     Resp 07/02/15 1945 16     Temp 07/02/15 1945 98.6 F (37 C)     Temp Source 07/02/15 1945 Oral     SpO2 07/02/15 1945 99 %     Weight 07/02/15 1945 175 lb (79.379 kg)     Height 07/02/15 1945 5\' 4"  (1.626 m)     Head Cir --      Peak Flow --      Pain Score 07/02/15 1946 10     Pain Loc --      Pain Edu? --      Excl. in [place]? --     Constitutional: Alert and oriented. Well appearing and in no acute distress. Eyes: Conjunctivae are normal. PERRL. EOMI. Head: Atraumatic. Nose: No congestion/rhinnorhea. Mouth/Throat: Mucous membranes are moist.  Oropharynx non-erythematous. Neck: No stridor. No carotid bruits. Cardiovascular: Normal rate, regular rhythm. Grossly normal heart sounds.  Good peripheral circulation. Respiratory: Normal respiratory effort.  No retractions. Lungs CTAB. Gastrointestinal: Soft and only tender to palpation epigastrium and right upper quadrant without rebound or guarding. No distention. No abdominal bruits. No CVA tenderness. Musculoskeletal: No lower extremity tenderness nor edema.  No joint effusions. Neurologic:  Normal speech and language. No gross focal neurologic deficits are appreciated. No gait instability. Skin:  Skin is warm, dry and intact. No rash noted. Psychiatric: Mood and affect are normal. Speech and behavior are normal.  ____________________________________________   LABS (all labs ordered are listed, but only abnormal results are displayed)  Labs Reviewed  COMPREHENSIVE METABOLIC PANEL - Abnormal; Notable for the following:    Sodium 133 (*)    Chloride 100 (*)    Glucose, Bld 322 (*)    All other components within normal limits  CBC - Abnormal; Notable for the following:    RDW 14.8 (*)    All other components within normal limits   URINALYSIS COMPLETEWITH MICROSCOPIC ([facility] ONLY) - Abnormal; Notable for the following:    Color, Urine YELLOW (*)    APPearance CLEAR (*)    Glucose, UA >500 (*)    Ketones, ur TRACE (*)    Hgb urine dipstick 3+ (*)    Squamous Epithelial / LPF 0-5 (*)    All other components within normal limits  LIPASE, BLOOD  POC URINE PREG, ED  POCT PREGNANCY, URINE   ____________________________________________  EKG  ED ECG REPORT I, Zidane Renner J, the attending physician, personally viewed and interpreted this ECG.   Date: 07/02/2015  EKG Time: 1951  Rate: 94  Rhythm: normal EKG, normal sinus rhythm  Axis: Normal  Intervals:none  ST&T Change: Nonspecific  ____________________________________________  RADIOLOGY  CT angiography chest PE interpreted per Dr. Gerilyn Nestle: No evidence of significant pulmonary embolus. No evidence of active pulmonary disease. ____________________________________________   PROCEDURES  Procedure(s) performed: None  Critical Care performed: No  ____________________________________________   INITIAL IMPRESSION / ASSESSMENT AND PLAN / ED COURSE  Pertinent labs & imaging results that were available during my care of the patient were reviewed by me and considered in my medical decision making (see chart for details).  44 year old female who returns to the ED for sharp central chest pain radiating to her right upper quadrant. Gallstones and sludge seen on recent ultrasound. However, given her chest pain complaints, will obtain CTA chest to evaluate for pulmonary embolus.  ----------------------------------------- 1:28 AM on  07/03/2015 -----------------------------------------  Patient improved. Updated patient of negative CT chest results. Feel her symptoms most likely consistent with biliary colic. Will upgrade her analgesic to Percocet, discussion for anti-medic and referral to general surgery. Strict return precautions given. Patient verbalizes  understanding and agrees with plan of care. ____________________________________________   FINAL CLINICAL IMPRESSION(S) / ED DIAGNOSES  Final diagnoses:  Biliary colic      Paulette Blanch, MD 07/03/15 [phone]

## 2015-07-02 NOTE — ED Notes (Signed)
Pt c/o pain to right upper chest radiating below right rib area and around to back. c/o pain for a little over a week. Evaluated on Monday for same. Dx with UTI and gallstones. States pain has continued to increase. +nausea. Denies vomiting. Denies SOB or other symptoms.

## 2015-07-03 MED ORDER — ONDANSETRON 4 MG PO TBDP
4.0000 mg | ORAL_TABLET | Freq: Once | ORAL | Status: AC
  Administered 2015-07-03: 4 mg via ORAL
  Filled 2015-07-03: qty 1

## 2015-07-03 MED ORDER — OXYCODONE-ACETAMINOPHEN 5-325 MG PO TABS: 1.0000 | ORAL_TABLET | ORAL | Status: DC | PRN

## 2015-07-03 MED ORDER — OXYCODONE-ACETAMINOPHEN 5-325 MG PO TABS
1.0000 | ORAL_TABLET | Freq: Once | ORAL | Status: AC
  Administered 2015-07-03: 1 via ORAL
  Filled 2015-07-03: qty 1

## 2015-07-03 MED ORDER — ONDANSETRON HCL 4 MG PO TABS: 4.0000 mg | ORAL_TABLET | Freq: Three times a day (TID) | ORAL | Status: DC | PRN

## 2015-07-03 NOTE — Discharge Instructions (Signed)
1. Take medicines as needed for pain & nausea (Percocet/Zofran #20). 2. Clear liquids x 12 hours, then bland diet x 1 week, then slowly advance diet as tolerated. Avoid fatty, greasy, spicy foods and drinks. 3. Return to the ER for worsening symptoms, persistent vomiting, fever, difficulty breathing or other concerns.  Cholelithiasis Cholelithiasis (also called gallstones) is a form of gallbladder disease in which gallstones form in your gallbladder. The gallbladder is an organ that stores bile made in the liver, which helps digest fats. Gallstones begin as small crystals and slowly grow into stones. Gallstone pain occurs when the gallbladder spasms and a gallstone is blocking the duct. Pain can also occur when a stone passes out of the duct.  RISK FACTORS  Being female.   Having multiple pregnancies. Health care providers sometimes advise removing diseased gallbladders before future pregnancies.   Being obese.  Eating a diet heavy in fried foods and fat.   Being older than 73 years and increasing age.   Prolonged use of medicines containing female hormones.   Having diabetes mellitus.   Rapidly losing weight.   Having a family history of gallstones (heredity).  SYMPTOMS  Nausea.   Vomiting.  Abdominal pain.   Yellowing of the skin (jaundice).   Sudden pain. It may persist from several minutes to several hours.  Fever.   Tenderness to the touch. In some cases, when gallstones do not move into the bile duct, people have no pain or symptoms. These are called "silent" gallstones.  TREATMENT Silent gallstones do not need treatment. In severe cases, emergency surgery may be required. Options for treatment include:  Surgery to remove the gallbladder. This is the most common treatment.  Medicines. These do not always work and may take 6-12 months or more to work.  Shock wave treatment (extracorporeal biliary lithotripsy). In this treatment an ultrasound machine  sends shock waves to the gallbladder to break gallstones into smaller pieces that can pass into the intestines or be dissolved by medicine. HOME CARE INSTRUCTIONS   Only take over-the-counter or prescription medicines for pain, discomfort, or fever as directed by your health care provider.   Follow a low-fat diet until seen again by your health care provider. Fat causes the gallbladder to contract, which can result in pain.   Follow up with your health care provider as directed. Attacks are almost always recurrent and surgery is usually required for permanent treatment.  SEEK IMMEDIATE MEDICAL CARE IF:   Your pain increases and is not controlled by medicines.   You have a fever or persistent symptoms for more than 2-3 days.   You have a fever and your symptoms suddenly get worse.   You have persistent nausea and vomiting.  MAKE SURE YOU:   Understand these instructions.  Will watch your condition.  Will get help right away if you are not doing well or get worse.   This information is not intended to replace advice given to you by your health care provider. Make sure you discuss any questions you have with your health care provider.   Document Released: 08/30/2005 Document Revised: 05/06/2013 Document Reviewed: 02/25/2013 Elsevier Interactive Patient Education [employer].

## 2015-07-03 NOTE — ED Notes (Signed)
MD at bedside. 

## 2015-07-06 ENCOUNTER — Telehealth: Payer: Self-pay

## 2015-07-06 ENCOUNTER — Telehealth: Payer: Self-pay | Admitting: Internal Medicine

## 2015-07-06 ENCOUNTER — Encounter: Payer: Self-pay | Admitting: Surgery

## 2015-07-06 ENCOUNTER — Ambulatory Visit (INDEPENDENT_AMBULATORY_CARE_PROVIDER_SITE_OTHER): Payer: Self-pay | Admitting: Surgery

## 2015-07-06 ENCOUNTER — Ambulatory Visit: Payer: Self-pay | Admitting: Internal Medicine

## 2015-07-06 VITALS — BP 115/74 | HR 87 | Temp 98.5°F | Ht 64.0 in | Wt 182.0 lb

## 2015-07-06 DIAGNOSIS — K8020 Calculus of gallbladder without cholecystitis without obstruction: Secondary | ICD-10-CM

## 2015-07-06 NOTE — Telephone Encounter (Signed)
Called script to pharmacy as requested.

## 2015-07-06 NOTE — Telephone Encounter (Signed)
FYI, Pt will not be able to make her appt today due to her daughter being in the hospital she is 7 month pregnant. Pt needs a refill on her ALPRAZolam (XANAX) 0.5 MG tablet she went to the pharmacy yesterday and no prescription. Thank You!

## 2015-07-06 NOTE — Progress Notes (Signed)
Surgical Consultation  07/06/2015  [name] is an 44 y.o. female.   CC: Right upper quadrant pain  HPI: Patient was in the emergency room recently with right upper quadrant pain nausea which resolved and the patient was discharged from the ER with a diagnosis of biliary colic. Gallstones were identified with normal liver function tests. Patient was treated with antibiotics for a day tract infection but she has had no urinary tract symptoms. She had no fatty food intolerance no fevers or chills did have nausea and a single emesis. She has a significant family history for gallstone pancreatitis.    Past Medical History  Diagnosis Date  . Diabetes mellitus   . allergic rhinitis   . Depression   . GERD (gastroesophageal reflux disease)   . History of migraine headaches   . tobacco abuse     Past Surgical History  Procedure Laterality Date  . Tubal ligation    . Cesarean section      x 2,  breech, premature 7 wks    Family History  Problem Relation Age of Onset  . Hypertension Mother   . Heart disease Father   . Hyperlipidemia Father   . Stroke Father   . Diabetes Father   . Hypertension Father     Social History:  reports that she has been smoking.  She has never used smokeless tobacco. She reports that she does not drink alcohol or use illicit drugs.  Allergies: No Known Allergies  Medications reviewed.   Review of Systems:   Review of Systems  Constitutional: Negative.  Negative for fever and chills.  HENT: Negative.   Eyes: Negative.   Respiratory: Negative.   Cardiovascular: Positive for chest pain. Negative for palpitations and orthopnea.  Gastrointestinal: Positive for nausea, vomiting and abdominal pain. Negative for heartburn, diarrhea, constipation, blood in stool and melena.  Genitourinary: Negative.   Musculoskeletal: Negative.   Skin: Negative.   Neurological: Negative.   Endo/Heme/Allergies: Negative.   Psychiatric/Behavioral: Negative.       Physical Exam:  LMP 06/05/2015  Physical Exam  Constitutional: She is oriented to person, place, and time and well-developed, well-nourished, and in no distress. No distress.  HENT:  Head: Normocephalic and atraumatic.  Eyes: Pupils are equal, round, and reactive to light. No scleral icterus.  Neck: Neck supple.  Cardiovascular: Normal rate, regular rhythm and normal heart sounds.   Pulmonary/Chest: Effort normal and breath sounds normal. No respiratory distress. She has no wheezes. She has no rales.  Abdominal: Soft. She exhibits no distension. There is tenderness. There is no rebound and no guarding.  Minimal right upper quadrant tenderness with a negative Murphy sign  Musculoskeletal: Normal range of motion. She exhibits edema.  Minimal peripheral edema in the lower extremities  Lymphadenopathy:    She has no cervical adenopathy.  Neurological: She is alert and oriented to person, place, and time.  Skin: Skin is warm and dry.  Psychiatric: Mood, affect and judgment normal.      No results found for this or any previous visit (from the past 48 hour(s)). No results found.  Assessment/Plan:  As the patient to stop smoking. She does have a significant family history for gallbladder disease and is experiencing recurrent biliary colic. I recommended laparoscopic cholecystectomy for control of her symptoms. Rationale for this been discussed with she and her sister who is an Therapist, sports at the cancer center and we discussed the procedure itself the risks of bleeding infection recurrence of symptoms failure to  resolve her symptoms (procedure bile duct damage bile duct leak retained common bile duct stone any of which could require further surgery and/or ERCP stent and papillotomy this is all reviewed for them they understood and agreed to proceed.  Florene Glen, MD, FACS

## 2015-07-06 NOTE — Telephone Encounter (Signed)
Script called to [facility] as requested patient notified.

## 2015-07-06 NOTE — Patient Instructions (Signed)
You will receive all call from our surgery scheduler in the next few days detailing the date and time of your pre op appointment and procedure. If you have any question, please do not hesitate to call our office at your earliest convenience.

## 2015-07-06 NOTE — Telephone Encounter (Signed)
Patient called and said she was told by Juliann Pulse that Rx for Xanax was sent to [facility] on [address]. Patient states that she went to pick it up, and the pharmacy did not have it. Patient wants to know if it can be re-sent.

## 2015-07-07 ENCOUNTER — Telehealth: Payer: Self-pay | Admitting: Surgery

## 2015-07-07 NOTE — Telephone Encounter (Signed)
Pt advised of pre op date/time and sx date. Sx: 07/22/15 with Dr Maeola Sarah cholecystectomy with IOL. Pre op: 07/14/15 between 9:00-1:00pm--Phone.

## 2015-07-14 ENCOUNTER — Encounter: Payer: Self-pay | Admitting: *Deleted

## 2015-07-14 ENCOUNTER — Other Ambulatory Visit: Payer: Self-pay

## 2015-07-14 NOTE — Patient Instructions (Addendum)
  Your procedure is scheduled on: 07-22-15 (FRIDAY) Report to [facility] To find out your arrival time please call [phone] between 1PM - 3PM on 07-21-15 (THURSDAY)  Remember: Instructions that are not followed completely may result in serious medical risk, up to and including death, or upon the discretion of your surgeon and anesthesiologist your surgery may need to be rescheduled.    _X___ 1. Do not eat food or drink liquids after midnight. No gum chewing or hard candies.     _X___ 2. No Alcohol for 24 hours before or after surgery.   ____ 3. Bring all medications with you on the day of surgery if instructed.    _X___ 4. Notify your doctor if there is any change in your medical condition     (cold, fever, infections).     Do not wear jewelry, make-up, hairpins, clips or nail polish.  Do not wear lotions, powders, or perfumes. You may wear deodorant.  Do not shave 48 hours prior to surgery. Men may shave face and neck.  Do not bring valuables to the hospital.    [facility] is not responsible for any belongings or valuables.               Contacts, dentures or bridgework may not be worn into surgery.  Leave your suitcase in the car. After surgery it may be brought to your room.  For patients admitted to the hospital, discharge time is determined by your treatment team.   Patients discharged the day of surgery will not be allowed to drive home.   Please read over the following fact sheets that you were given:      _X___ Take these medicines the morning of surgery with A SIP OF WATER:    1. GABAPENTIN (NEURONTIN)  2.   3.   4.  5.  6.  ____ Fleet Enema (as directed)   _X___ Use CHG Soap as directed  ____ Use inhalers on the day of surgery  ____ Stop metformin 2 days prior to surgery    _X___ Take 1/2 of usual insulin dose the night before surgery and none on the morning of surgery.   ____ Stop Coumadin/Plavix/aspirin-N/A  ____ Stop  Anti-inflammatories-NO NSAIDS OR ASPIRIN PRODUCTS-PERCOCET/TRAMADOL  OK TO CONTINUE   ____ Stop supplements until after surgery.    ____ Bring C-Pap to the hospital.

## 2015-07-18 ENCOUNTER — Encounter
Admission: RE | Admit: 2015-07-18 | Discharge: 2015-07-18 | Disposition: A | Discharge status: Home / Self Care | Payer: Self-pay | Source: Ambulatory Visit | Attending: Surgery | Admitting: Surgery

## 2015-07-18 DIAGNOSIS — Z01818 Encounter for other preprocedural examination: Secondary | ICD-10-CM | POA: Insufficient documentation

## 2015-07-18 DIAGNOSIS — K8050 Calculus of bile duct without cholangitis or cholecystitis without obstruction: Secondary | ICD-10-CM | POA: Insufficient documentation

## 2015-07-18 LAB — COMPREHENSIVE METABOLIC PANEL
ALT: 17 U/L (ref 14–54)
ANION GAP: 8 (ref 5–15)
AST: 19 U/L (ref 15–41)
Albumin: 3.7 g/dL (ref 3.5–5.0)
Alkaline Phosphatase: 82 U/L (ref 38–126)
BILIRUBIN TOTAL: 0.2 mg/dL — AB (ref 0.3–1.2)
BUN: 15 mg/dL (ref 6–20)
CHLORIDE: 101 mmol/L (ref 101–111)
CO2: 27 mmol/L (ref 22–32)
Calcium: 9.8 mg/dL (ref 8.9–10.3)
Creatinine, Ser: 0.63 mg/dL (ref 0.44–1.00)
Glucose, Bld: 209 mg/dL — ABNORMAL HIGH (ref 65–99)
POTASSIUM: 4.4 mmol/L (ref 3.5–5.1)
Sodium: 136 mmol/L (ref 135–145)
TOTAL PROTEIN: 7.8 g/dL (ref 6.5–8.1)

## 2015-07-18 LAB — CBC WITH DIFFERENTIAL/PLATELET
BASOS ABS: 0 10*3/uL (ref 0–0.1)
Basophils Relative: 0 %
EOS PCT: 3 %
Eosinophils Absolute: 0.3 10*3/uL (ref 0–0.7)
HEMATOCRIT: 39.2 % (ref 35.0–47.0)
Hemoglobin: 13 g/dL (ref 12.0–16.0)
LYMPHS ABS: 2.2 10*3/uL (ref 1.0–3.6)
LYMPHS PCT: 27 %
MCH: 27.7 pg (ref 26.0–34.0)
MCHC: 33.2 g/dL (ref 32.0–36.0)
MCV: 83.5 fL (ref 80.0–100.0)
MONO ABS: 0.6 10*3/uL (ref 0.2–0.9)
MONOS PCT: 7 %
Neutro Abs: 5.1 10*3/uL (ref 1.4–6.5)
Neutrophils Relative %: 63 %
PLATELETS: 291 10*3/uL (ref 150–440)
RBC: 4.69 MIL/uL (ref 3.80–5.20)
RDW: 14.2 % (ref 11.5–14.5)
WBC: 8.2 10*3/uL (ref 3.6–11.0)

## 2015-07-22 ENCOUNTER — Encounter
Admission: RE | Disposition: A | Discharge status: Home / Self Care | Payer: Self-pay | Source: Ambulatory Visit | Attending: Surgery

## 2015-07-22 ENCOUNTER — Ambulatory Visit: Payer: Self-pay | Admitting: Anesthesiology

## 2015-07-22 ENCOUNTER — Ambulatory Visit
Admission: RE | Admit: 2015-07-22 | Discharge: 2015-07-22 | Disposition: A | Discharge status: Home / Self Care | Payer: Self-pay | Source: Ambulatory Visit | Attending: Surgery | Admitting: Surgery

## 2015-07-22 ENCOUNTER — Encounter: Payer: Self-pay | Admitting: *Deleted

## 2015-07-22 DIAGNOSIS — F329 Major depressive disorder, single episode, unspecified: Secondary | ICD-10-CM | POA: Insufficient documentation

## 2015-07-22 DIAGNOSIS — E119 Type 2 diabetes mellitus without complications: Secondary | ICD-10-CM | POA: Insufficient documentation

## 2015-07-22 DIAGNOSIS — F17200 Nicotine dependence, unspecified, uncomplicated: Secondary | ICD-10-CM | POA: Insufficient documentation

## 2015-07-22 DIAGNOSIS — Z823 Family history of stroke: Secondary | ICD-10-CM | POA: Insufficient documentation

## 2015-07-22 DIAGNOSIS — Z8249 Family history of ischemic heart disease and other diseases of the circulatory system: Secondary | ICD-10-CM | POA: Insufficient documentation

## 2015-07-22 DIAGNOSIS — Z833 Family history of diabetes mellitus: Secondary | ICD-10-CM | POA: Insufficient documentation

## 2015-07-22 DIAGNOSIS — Z8349 Family history of other endocrine, nutritional and metabolic diseases: Secondary | ICD-10-CM | POA: Insufficient documentation

## 2015-07-22 DIAGNOSIS — Z9851 Tubal ligation status: Secondary | ICD-10-CM | POA: Insufficient documentation

## 2015-07-22 DIAGNOSIS — Z79891 Long term (current) use of opiate analgesic: Secondary | ICD-10-CM | POA: Insufficient documentation

## 2015-07-22 DIAGNOSIS — K8010 Calculus of gallbladder with chronic cholecystitis without obstruction: Secondary | ICD-10-CM | POA: Insufficient documentation

## 2015-07-22 DIAGNOSIS — Z794 Long term (current) use of insulin: Secondary | ICD-10-CM | POA: Insufficient documentation

## 2015-07-22 DIAGNOSIS — K8020 Calculus of gallbladder without cholecystitis without obstruction: Secondary | ICD-10-CM

## 2015-07-22 DIAGNOSIS — J309 Allergic rhinitis, unspecified: Secondary | ICD-10-CM | POA: Insufficient documentation

## 2015-07-22 DIAGNOSIS — K219 Gastro-esophageal reflux disease without esophagitis: Secondary | ICD-10-CM | POA: Insufficient documentation

## 2015-07-22 DIAGNOSIS — Z79899 Other long term (current) drug therapy: Secondary | ICD-10-CM | POA: Insufficient documentation

## 2015-07-22 HISTORY — DX: Headache: R51

## 2015-07-22 HISTORY — PX: CHOLECYSTECTOMY: SHX55

## 2015-07-22 HISTORY — DX: Anemia, unspecified: D64.9

## 2015-07-22 HISTORY — DX: Urinary tract infection, site not specified: N39.0

## 2015-07-22 HISTORY — DX: Headache, unspecified: R51.9

## 2015-07-22 LAB — POCT PREGNANCY, URINE: PREG TEST UR: NEGATIVE

## 2015-07-22 LAB — GLUCOSE, CAPILLARY
GLUCOSE-CAPILLARY: 338 mg/dL — AB (ref 65–99)
GLUCOSE-CAPILLARY: 314 mg/dL — AB (ref 65–99)
Glucose-Capillary: 305 mg/dL — ABNORMAL HIGH (ref 65–99)

## 2015-07-22 SURGERY — LAPAROSCOPIC CHOLECYSTECTOMY WITH INTRAOPERATIVE CHOLANGIOGRAM
Anesthesia: General | Wound class: Clean Contaminated

## 2015-07-22 MED ORDER — BUPIVACAINE-EPINEPHRINE (PF) 0.25% -1:200000 IJ SOLN
INTRAMUSCULAR | Status: DC | PRN
  Administered 2015-07-22: 30 mL

## 2015-07-22 MED ORDER — LIDOCAINE HCL (CARDIAC) 20 MG/ML IV SOLN
INTRAVENOUS | Status: DC | PRN
  Administered 2015-07-22: 100 mg via INTRAVENOUS

## 2015-07-22 MED ORDER — FAMOTIDINE 20 MG PO TABS
20.0000 mg | ORAL_TABLET | Freq: Once | ORAL | Status: AC
  Administered 2015-07-22: 20 mg via ORAL

## 2015-07-22 MED ORDER — CEFAZOLIN SODIUM-DEXTROSE 2-3 GM-% IV SOLR: 2.0000 g | INTRAVENOUS | Status: DC

## 2015-07-22 MED ORDER — HYDROMORPHONE HCL 1 MG/ML IJ SOLN
INTRAMUSCULAR | Status: AC
  Administered 2015-07-22: 0.25 mg via INTRAVENOUS
  Filled 2015-07-22: qty 1

## 2015-07-22 MED ORDER — ROCURONIUM BROMIDE 100 MG/10ML IV SOLN
INTRAVENOUS | Status: DC | PRN
  Administered 2015-07-22: 10 mg via INTRAVENOUS

## 2015-07-22 MED ORDER — PHENYLEPHRINE HCL 10 MG/ML IJ SOLN
INTRAMUSCULAR | Status: DC | PRN
  Administered 2015-07-22 (×2): 100 ug via INTRAVENOUS

## 2015-07-22 MED ORDER — INSULIN ASPART 100 UNIT/ML ~~LOC~~ SOLN
SUBCUTANEOUS | Status: AC
  Administered 2015-07-22: 10 [IU] via INTRAVENOUS
  Administered 2015-07-22: 10 [IU] via SUBCUTANEOUS
  Filled 2015-07-22: qty 20

## 2015-07-22 MED ORDER — ONDANSETRON HCL 4 MG/2ML IJ SOLN
INTRAMUSCULAR | Status: AC
  Filled 2015-07-22: qty 2

## 2015-07-22 MED ORDER — FENTANYL CITRATE (PF) 100 MCG/2ML IJ SOLN
INTRAMUSCULAR | Status: DC | PRN
  Administered 2015-07-22 (×2): 100 ug via INTRAVENOUS
  Administered 2015-07-22: 50 ug via INTRAVENOUS

## 2015-07-22 MED ORDER — ONDANSETRON HCL 4 MG/2ML IJ SOLN
INTRAMUSCULAR | Status: DC | PRN
  Administered 2015-07-22: 4 mg via INTRAVENOUS

## 2015-07-22 MED ORDER — SODIUM CHLORIDE 0.9 % IV SOLN
INTRAVENOUS | Status: DC
  Administered 2015-07-22 (×2): via INTRAVENOUS

## 2015-07-22 MED ORDER — CEFAZOLIN SODIUM-DEXTROSE 2-3 GM-% IV SOLR
INTRAVENOUS | Status: AC
  Filled 2015-07-22: qty 50

## 2015-07-22 MED ORDER — SUCCINYLCHOLINE CHLORIDE 20 MG/ML IJ SOLN
INTRAMUSCULAR | Status: DC | PRN
  Administered 2015-07-22: 120 mg via INTRAVENOUS

## 2015-07-22 MED ORDER — ACETAMINOPHEN 10 MG/ML IV SOLN
INTRAVENOUS | Status: AC
  Filled 2015-07-22: qty 100

## 2015-07-22 MED ORDER — HYDROCODONE-ACETAMINOPHEN 5-300 MG PO TABS: 1.0000 | ORAL_TABLET | ORAL | Status: DC | PRN

## 2015-07-22 MED ORDER — HYDROCODONE-ACETAMINOPHEN 5-325 MG PO TABS
1.0000 | ORAL_TABLET | ORAL | Status: DC | PRN
  Administered 2015-07-22: 1 via ORAL

## 2015-07-22 MED ORDER — ONDANSETRON HCL 4 MG/2ML IJ SOLN
4.0000 mg | Freq: Once | INTRAMUSCULAR | Status: AC | PRN
  Administered 2015-07-22: 4 mg via INTRAVENOUS

## 2015-07-22 MED ORDER — ACETAMINOPHEN 10 MG/ML IV SOLN
INTRAVENOUS | Status: DC | PRN
  Administered 2015-07-22: 1000 mg via INTRAVENOUS

## 2015-07-22 MED ORDER — PROPOFOL 10 MG/ML IV BOLUS
INTRAVENOUS | Status: DC | PRN
  Administered 2015-07-22: 160 mg via INTRAVENOUS

## 2015-07-22 MED ORDER — SUGAMMADEX SODIUM 200 MG/2ML IV SOLN
INTRAVENOUS | Status: DC | PRN
  Administered 2015-07-22: 165.2 mg via INTRAVENOUS

## 2015-07-22 MED ORDER — HYDROMORPHONE HCL 1 MG/ML IJ SOLN
0.2500 mg | INTRAMUSCULAR | Status: DC | PRN
  Administered 2015-07-22 (×4): 0.25 mg via INTRAVENOUS

## 2015-07-22 MED ORDER — KETOROLAC TROMETHAMINE 30 MG/ML IJ SOLN
INTRAMUSCULAR | Status: DC | PRN
Start: 2015-07-22 — End: 2015-07-22
  Administered 2015-07-22: 30 mg via INTRAVENOUS

## 2015-07-22 MED ORDER — MIDAZOLAM HCL 2 MG/2ML IJ SOLN
INTRAMUSCULAR | Status: DC | PRN
  Administered 2015-07-22: 2 mg via INTRAVENOUS

## 2015-07-22 MED ORDER — HEPARIN SODIUM (PORCINE) 5000 UNIT/ML IJ SOLN
INTRAMUSCULAR | Status: AC
  Filled 2015-07-22: qty 1

## 2015-07-22 MED ORDER — DEXAMETHASONE SODIUM PHOSPHATE 4 MG/ML IJ SOLN
INTRAMUSCULAR | Status: DC | PRN
  Administered 2015-07-22: 10 mg via INTRAVENOUS

## 2015-07-22 MED ORDER — HEPARIN SODIUM (PORCINE) 5000 UNIT/ML IJ SOLN
5000.0000 [IU] | Freq: Once | INTRAMUSCULAR | Status: AC
Start: 2015-07-22 — End: 2015-07-22
  Administered 2015-07-22: 5000 [IU] via SUBCUTANEOUS

## 2015-07-22 MED ORDER — BUPIVACAINE-EPINEPHRINE (PF) 0.25% -1:200000 IJ SOLN
INTRAMUSCULAR | Status: AC
  Filled 2015-07-22: qty 30

## 2015-07-22 MED ORDER — HYDROCODONE-ACETAMINOPHEN 5-325 MG PO TABS
ORAL_TABLET | ORAL | Status: AC
  Filled 2015-07-22: qty 1

## 2015-07-22 MED ORDER — FAMOTIDINE 20 MG PO TABS
ORAL_TABLET | ORAL | Status: AC
  Filled 2015-07-22: qty 1

## 2015-07-22 SURGICAL SUPPLY — 42 items
ADHESIVE MASTISOL STRL (MISCELLANEOUS) ×3 IMPLANT
APPLIER CLIP ROT 10 11.4 M/L (STAPLE) ×3
BLADE SURG SZ11 CARB STEEL (BLADE) ×3 IMPLANT
CANISTER SUCT 1200ML W/VALVE (MISCELLANEOUS) ×3 IMPLANT
CATH CHOLANGI 4FR 420404F (CATHETERS) IMPLANT
CHLORAPREP W/TINT 26ML (MISCELLANEOUS) ×3 IMPLANT
CLIP APPLIE ROT 10 11.4 M/L (STAPLE) ×1 IMPLANT
CLOSURE WOUND 1/2 X4 (GAUZE/BANDAGES/DRESSINGS) ×1
CONRAY 60ML FOR OR (MISCELLANEOUS) IMPLANT
DRAPE C-ARM XRAY 36X54 (DRAPES) IMPLANT
ENDOPOUCH RETRIEVER 10 (MISCELLANEOUS) ×3 IMPLANT
GAUZE SPONGE NON-WVN 2X2 STRL (MISCELLANEOUS) ×4 IMPLANT
GLOVE BIO SURGEON STRL SZ8 (GLOVE) ×15 IMPLANT
GOWN STRL REUS W/ TWL LRG LVL3 (GOWN DISPOSABLE) ×4 IMPLANT
GOWN STRL REUS W/TWL LRG LVL3 (GOWN DISPOSABLE) ×8
IRRIGATION STRYKERFLOW (MISCELLANEOUS) ×1 IMPLANT
IRRIGATOR STRYKERFLOW (MISCELLANEOUS) ×3
IV CATH ANGIO 12GX3 LT BLUE (NEEDLE) IMPLANT
IV NS 1000ML (IV SOLUTION) ×2
IV NS 1000ML BAXH (IV SOLUTION) ×1 IMPLANT
JACKSON PRATT 10 (INSTRUMENTS) IMPLANT
KIT RM TURNOVER STRD PROC AR (KITS) ×3 IMPLANT
LABEL OR SOLS (LABEL) ×3 IMPLANT
NDL SAFETY 22GX1.5 (NEEDLE) ×3 IMPLANT
NEEDLE VERESS 14GA 120MM (NEEDLE) ×3 IMPLANT
NS IRRIG 500ML POUR BTL (IV SOLUTION) ×3 IMPLANT
PACK LAP CHOLECYSTECTOMY (MISCELLANEOUS) ×3 IMPLANT
PAD GROUND ADULT SPLIT (MISCELLANEOUS) ×3 IMPLANT
SCISSORS METZENBAUM CVD 33 (INSTRUMENTS) ×3 IMPLANT
SLEEVE ENDOPATH XCEL 5M (ENDOMECHANICALS) ×3 IMPLANT
SPONGE EXCIL AMD DRAIN 4X4 6P (MISCELLANEOUS) IMPLANT
SPONGE LAP 18X18 5 PK (GAUZE/BANDAGES/DRESSINGS) ×3 IMPLANT
SPONGE VERSALON 2X2 STRL (MISCELLANEOUS) ×8
STRIP CLOSURE SKIN 1/2X4 (GAUZE/BANDAGES/DRESSINGS) ×2 IMPLANT
SUT MNCRL 4-0 (SUTURE) ×4
SUT MNCRL 4-0 27XMFL (SUTURE) ×2
SUT VICRYL 0 AB UR-6 (SUTURE) ×3 IMPLANT
SUTURE MNCRL 4-0 27XMF (SUTURE) ×2 IMPLANT
SYR 20CC LL (SYRINGE) IMPLANT
TROCAR XCEL NON-BLD 11X100MML (ENDOMECHANICALS) ×3 IMPLANT
TROCAR XCEL NON-BLD 5MMX100MML (ENDOMECHANICALS) ×6 IMPLANT
TUBING INSUFFLATOR HI FLOW (MISCELLANEOUS) ×3 IMPLANT

## 2015-07-22 NOTE — Anesthesia Preprocedure Evaluation (Signed)
Anesthesia Evaluation  Patient identified by MRN, date of birth, ID band Patient awake    Reviewed: Allergy & Precautions, NPO status , Patient's Chart, lab work & pertinent test results  Airway Mallampati: III  TM Distance: >3 FB Neck ROM: Full    Dental  (+) Teeth Intact   Pulmonary Current Smoker,    Pulmonary exam normal        Cardiovascular Exercise Tolerance: Good negative cardio ROS Normal cardiovascular exam     Neuro/Psych    GI/Hepatic GERD  Medicated and Controlled,  Endo/Other  diabetes, Poorly Controlled, Type 2BG 338.  Renal/GU      Musculoskeletal   Abdominal (+) + obese,  Abdomen: soft.    Peds  Hematology   Anesthesia Other Findings   Reproductive/Obstetrics                             Anesthesia Physical Anesthesia Plan  ASA: III  Anesthesia Plan: General   Post-op Pain Management:    Induction: Intravenous  Airway Management Planned: Oral ETT  Additional Equipment:   Intra-op Plan:   Post-operative Plan: Extubation in OR  Informed Consent: I have reviewed the patients History and Physical, chart, labs and discussed the procedure including the risks, benefits and alternatives for the proposed anesthesia with the patient or authorized representative who has indicated his/her understanding and acceptance.     Plan Discussed with: CRNA  Anesthesia Plan Comments: (I will start treating her elevated BG at the end of surgery and in PACU.)        Anesthesia Quick Evaluation

## 2015-07-22 NOTE — Anesthesia Procedure Notes (Signed)
Procedure Name: Intubation Date/Time: 07/22/2015 7:47 AM Performed by: Nelda Marseille Pre-anesthesia Checklist: Patient identified, Patient being monitored, Timeout performed, Emergency Drugs available and Suction available Patient Re-evaluated:Patient Re-evaluated prior to inductionOxygen Delivery Method: Circle System Utilized Preoxygenation: Pre-oxygenation with 100% oxygen Intubation Type: IV induction Ventilation: Mask ventilation without difficulty Laryngoscope Size: Mac and 3 Grade View: Grade I Tube type: Oral Tube size: 7.0 mm Number of attempts: 1 Airway Equipment and Method: Stylet Placement Confirmation: ETT inserted through vocal cords under direct vision,  positive ETCO2 and breath sounds checked- equal and bilateral Secured at: 21 cm Tube secured with: Tape Dental Injury: Teeth and Oropharynx as per pre-operative assessment

## 2015-07-22 NOTE — Transfer of Care (Signed)
Immediate Anesthesia Transfer of Care Note  Patient: [name]  Procedure(s) Performed: Procedure(s): LAPAROSCOPIC CHOLECYSTECTOMY  (N/A)  Patient Location: PACU  Anesthesia Type:General  Level of Consciousness: sedated  Airway & Oxygen Therapy: Patient Spontanous Breathing and Patient connected to face mask oxygen  Post-op Assessment: Report given to RN and Post -op Vital signs reviewed and stable  Post vital signs: Reviewed and stable  Last Vitals:  Filed Vitals:   07/22/15 0612  BP: 115/70  Pulse: 99  Temp: 36.7 C  Resp: 18    Complications: No apparent anesthesia complications

## 2015-07-22 NOTE — Anesthesia Postprocedure Evaluation (Signed)
  Anesthesia Post-op Note  Patient: [name]  Procedure(s) Performed: Procedure(s): LAPAROSCOPIC CHOLECYSTECTOMY  (N/A)  Anesthesia type:General  Patient location: PACU  Post pain: Pain level controlled  Post assessment: Post-op Vital signs reviewed, Patient's Cardiovascular Status Stable, Respiratory Function Stable, Patent Airway and No signs of Nausea or vomiting  Post vital signs: Reviewed and stable  Last Vitals:  Filed Vitals:   07/22/15 0824  BP: 124/78  Pulse: 87  Temp: 36 C  Resp: 22    Level of consciousness: awake, alert  and patient cooperative  Complications: No apparent anesthesia complications

## 2015-07-22 NOTE — Op Note (Signed)
Laparoscopic Cholecystectomy  Pre-operative Diagnosis: Biliary colic  Post-operative Diagnosis: Biliary colic  Procedure: Laparoscopic cholecystectomy  Surgeon: Jerrol Banana. Burt Knack, MD FACS  Anesthesia: Gen. with endotracheal tube  Assistant: PA student  Procedure Details  The patient was seen again in the Holding Room. The benefits, complications, treatment options, and expected outcomes were discussed with the patient. The risks of bleeding, infection, recurrence of symptoms, failure to resolve symptoms, bile duct damage, bile duct leak, retained common bile duct stone, bowel injury, any of which could require further surgery and/or ERCP, stent, or papillotomy were reviewed with the patient. The likelihood of improving the patient's symptoms with return to their baseline status is good.  The patient and/or family concurred with the proposed plan, giving informed consent.  The patient was taken to Operating Room, identified as MATSUE STROM and the procedure verified as Laparoscopic Cholecystectomy.  A Time Out was held and the above information confirmed.  Prior to the induction of general anesthesia, antibiotic prophylaxis was administered. VTE prophylaxis was in place. General endotracheal anesthesia was then administered and tolerated well. After the induction, the abdomen was prepped with Chloraprep and draped in the sterile fashion. The patient was positioned in the supine position.  Local anesthetic  was injected into the skin near the umbilicus and an incision made. The Veress needle was placed. Pneumoperitoneum was then created with CO2 and tolerated well without any adverse changes in the patient's vital signs. A 14mm port was placed in the periumbilical position and the abdominal cavity was explored.  Two 5-mm ports were placed in the right upper quadrant and a 12 mm epigastric port was placed all under direct vision. All skin incisions  were infiltrated with a local anesthetic agent  before making the incision and placing the trocars.   The patient was positioned  in reverse Trendelenburg, tilted slightly to the patient's left.  The gallbladder was identified, the fundus grasped and retracted cephalad. Adhesions were lysed bluntly. The infundibulum was grasped and retracted laterally, exposing the peritoneum overlying the triangle of Calot. This was then divided and exposed in a blunt fashion. A critical view of the cystic duct and cystic artery was obtained.  The cystic duct was clearly identified and bluntly dissected.   The cystic duct gallbladder junction was well identified. The cystic artery was doubly clipped and divided the cystic duct was then doubly clipped and divided. This revealed a lateral arterial branch which was doubly clipped and divided as well.  The gallbladder was taken from the gallbladder fossa in a retrograde fashion with the electrocautery. The gallbladder was removed and placed in an Endocatch bag. The liver bed was irrigated and inspected. Hemostasis was achieved with the electrocautery. Copious irrigation was utilized and was repeatedly aspirated until clear.  The gallbladder and Endocatch sac were then removed through the epigastric port site.   Inspection of the right upper quadrant was performed. No bleeding, bile duct injury or leak, or bowel injury was noted. Pneumoperitoneum was released.  The epigastric port site was closed with figure-of-eight 0 Vicryl sutures. 4-0 subcuticular Monocryl was used to close the skin. Steristrips and Mastisol and sterile dressings were  applied.  The patient was then extubated and brought to the recovery room in stable condition. Sponge, lap, and needle counts were correct at closure and at the conclusion of the case.   Findings: Chronic Cholecystitis   Estimated Blood Loss: 20 cc         Drains: None  Specimens: Gallbladder           Complications: none               Jayesh Marbach E. Burt Knack, MD, FACS

## 2015-07-22 NOTE — Discharge Instructions (Signed)
Remove dressing in 24 hours. °May shower in 24 hours. °Leave paper strips in place. °Resume all home medications. °Follow-up with Dr. Cooper in 10 days. ° °AMBULATORY SURGERY  °DISCHARGE INSTRUCTIONS ° ° °1) The drugs that you were given will stay in your system until tomorrow so for the next 24 hours you should not: ° °A) Drive an automobile °B) Make any legal decisions °C) Drink any alcoholic beverage ° ° °2) You may resume regular meals tomorrow.  Today it is better to start with liquids and gradually work up to solid foods. ° °You may eat anything you prefer, but it is better to start with liquids, then soup and crackers, and gradually work up to solid foods. ° ° °3) Please notify your doctor immediately if you have any unusual bleeding, trouble breathing, redness and pain at the surgery site, drainage, fever, or pain not relieved by medication. ° ° ° °4) Additional Instructions: ° ° ° ° ° ° ° °Please contact your physician with any problems or Same Day Surgery at [phone], Monday through Friday 6 am to 4 pm, or [facility] number at [phone]. °

## 2015-07-22 NOTE — Progress Notes (Signed)
Preoperative Review   Patient is met in the preoperative holding area. The history is reviewed in the chart and with the patient. I personally reviewed the options and rationale as well as the risks of this procedure that have been previously discussed with the patient. All questions asked by the patient and/or family were answered to their satisfaction.  Patient agrees to proceed with this procedure at this time.  Richard E Cooper M.D. FACS  

## 2015-07-25 LAB — SURGICAL PATHOLOGY

## 2015-07-29 ENCOUNTER — Other Ambulatory Visit
Admission: RE | Admit: 2015-07-29 | Discharge: 2015-07-29 | Disposition: A | Discharge status: Home / Self Care | Payer: Self-pay | Source: Ambulatory Visit | Attending: Surgery | Admitting: Surgery

## 2015-07-29 ENCOUNTER — Ambulatory Visit (INDEPENDENT_AMBULATORY_CARE_PROVIDER_SITE_OTHER): Payer: Self-pay | Admitting: Surgery

## 2015-07-29 ENCOUNTER — Encounter: Payer: Self-pay | Admitting: Surgery

## 2015-07-29 VITALS — BP 136/84 | HR 96 | Temp 98.3°F | Ht 64.0 in | Wt 181.0 lb

## 2015-07-29 DIAGNOSIS — K8080 Other cholelithiasis without obstruction: Secondary | ICD-10-CM | POA: Insufficient documentation

## 2015-07-29 DIAGNOSIS — K8020 Calculus of gallbladder without cholecystitis without obstruction: Secondary | ICD-10-CM

## 2015-07-29 LAB — URINALYSIS COMPLETE WITH MICROSCOPIC (ARMC ONLY)
BACTERIA UA: NONE SEEN
Bilirubin Urine: NEGATIVE
Glucose, UA: >500 mg/dL — AB
Nitrite: NEGATIVE
Protein, ur: NEGATIVE mg/dL
Specific Gravity, Urine: 1.035 — ABNORMAL HIGH (ref 1.005–1.030)
pH: 6 (ref 5.0–8.0)

## 2015-07-29 NOTE — Progress Notes (Signed)
Outpatient postop visit  07/29/2015  [name] is an 44 y.o. female.    Procedure: Laparoscopic cholecystectomy  CC: No problems  HPI: Patient status post laparoscopic cholecystectomy. 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 Her biggest complaint is that of occasional dysuria. No hematuria.   Medications reviewed.    Physical Exam:  BP 136/84 mmHg  Pulse 96  Temp(Src) 98.3 F (36.8 C) (Oral)  Ht 5\' 4"  (1.626 m)  Wt 181 lb (82.101 kg)  BMI 31.05 kg/m2  LMP 07/25/2015    PE wounds are clean no erythema no drainage soft nontender abdomen no scleral icterus    Assessment/Plan:  Status post laparoscopic cholecystectomy pathology showed no sign of malignancy. She is experiencing some dysuria and we will check AUA and forward the results to Dr. Derrel Nip. Otherwise she is doing well and can follow up on an as-needed basis Florene Glen, MD, FACS

## 2015-07-29 NOTE — Patient Instructions (Signed)
Please go to the medical mall at the hospital today to give a urine specimen. As soon as results are back, I will forward them to your PCP, Dr. Derrel Nip.  Please call our office with any other questions or concerns.

## 2015-08-01 ENCOUNTER — Telehealth: Payer: Self-pay

## 2015-08-01 MED ORDER — SULFAMETHOXAZOLE-TRIMETHOPRIM 800-160 MG PO TABS: 1.0000 | ORAL_TABLET | Freq: Two times a day (BID) | ORAL | Status: DC

## 2015-08-01 NOTE — Telephone Encounter (Signed)
Bactrim has been sent to patient's preferred pharmacy.

## 2015-08-01 NOTE — Telephone Encounter (Signed)
Received patient's positive UA at this time. Called over to Lab to ensure that patient's culture is being completed. Spoke to [employer] in microbiology who states that urine was thrown out because culture order did not make it to the lab interface.   Spoke with Dr. Adonis Huguenin about results, he ordered Bactrim DS 1 tab BID, #14, no refills.  Call made to patient at this time. No answer. Left voicemail for return phone call.

## 2015-08-02 NOTE — Telephone Encounter (Signed)
Patient called back at this time. Explained results and medication ordered. She states that she has already picked this up from Pharmacy as she was notified by them that medication was ready yesterday.  Patient has no further questions or concerns at this time.

## 2015-08-02 NOTE — Telephone Encounter (Signed)
Called patient once again. No answer. Left voicemail for return phone call.   Called patient's Mother (Emergency Contact) at this time. No answer. Left voicemail for return phone call.

## 2015-08-05 ENCOUNTER — Telehealth: Payer: Self-pay

## 2015-08-05 MED ORDER — CIPROFLOXACIN HCL 500 MG PO TABS: 500.0000 mg | ORAL_TABLET | Freq: Two times a day (BID) | ORAL | Status: DC

## 2015-08-05 NOTE — Telephone Encounter (Signed)
Patient called in and states that she has been on Bactrim for 4 days now and symptoms are not getting better.  Spoke with Dr. Marina Gravel- he ordered Cipro 500mg  BID x 5 days- 0 RF.  Orders sent top pharmacy.  Returned phone call to patient at this time. Explained that new medication has been sent to pharmacy and that she may also use Azo over the counter to help with symptoms until antibiotics can become effective. Informed her to stop Bactrim at this time.  Encouraged to call back if symptoms persist past 3 days after starting Cipro.

## 2015-09-07 ENCOUNTER — Telehealth: Payer: Self-pay | Admitting: *Deleted

## 2015-09-07 ENCOUNTER — Encounter: Payer: Self-pay | Admitting: Family Medicine

## 2015-09-07 ENCOUNTER — Telehealth: Payer: Self-pay | Admitting: Surgery

## 2015-09-07 ENCOUNTER — Ambulatory Visit (INDEPENDENT_AMBULATORY_CARE_PROVIDER_SITE_OTHER): Payer: Self-pay | Admitting: Family Medicine

## 2015-09-07 VITALS — BP 122/90 | HR 101 | Temp 97.8°F | Ht 64.0 in | Wt 178.2 lb

## 2015-09-07 DIAGNOSIS — E1165 Type 2 diabetes mellitus with hyperglycemia: Secondary | ICD-10-CM

## 2015-09-07 DIAGNOSIS — E1140 Type 2 diabetes mellitus with diabetic neuropathy, unspecified: Secondary | ICD-10-CM

## 2015-09-07 DIAGNOSIS — IMO0002 Reserved for concepts with insufficient information to code with codable children: Secondary | ICD-10-CM

## 2015-09-07 DIAGNOSIS — R3589 Other polyuria: Secondary | ICD-10-CM

## 2015-09-07 DIAGNOSIS — R358 Other polyuria: Secondary | ICD-10-CM

## 2015-09-07 DIAGNOSIS — R300 Dysuria: Secondary | ICD-10-CM | POA: Insufficient documentation

## 2015-09-07 LAB — POCT URINALYSIS DIPSTICK
Bilirubin, UA: NEGATIVE
Blood, UA: NEGATIVE
Ketones, UA: NEGATIVE
Leukocytes, UA: NEGATIVE
Nitrite, UA: NEGATIVE
Protein, UA: NEGATIVE
UROBILINOGEN UA: 0.2
pH, UA: 5.5

## 2015-09-07 NOTE — Telephone Encounter (Signed)
Please advise, Thanks!  

## 2015-09-07 NOTE — Assessment & Plan Note (Signed)
New problem. Urinalysis was not consistent with infection. It was notable for >1000 glucose. Given lack of findings, I am not sending for culture (especially as patient has no insurance and would prefer to not have the cost). Her urinary tract symptoms are from uncontrolled diabetes.

## 2015-09-07 NOTE — Telephone Encounter (Signed)
Had gallbladder surgery and now she keeps getting a urinary tract infection. She would like to talk to you about it.

## 2015-09-07 NOTE — Telephone Encounter (Signed)
Spoke with Dr. Adonis Huguenin in regards to this. Patient has been asked to go back to PCP for this as she has been treated twice by our office and with 2 different antibiotics. She is currently post-op 6 weeks and symptoms had subsided completely per patient after last antibiotic. Explained to patient that this is not surgical in nature as it has been too long and to inform PCP as there is most likely an underlying problem causing recurrent UTI symptoms. Pt verbalizes understanding and will call PCP.

## 2015-09-07 NOTE — Telephone Encounter (Signed)
Patient stated that she had her gall bladder taken out the first of Nov. She also was treated for a Uti. Patient stated that she feels her [name] has returned. Patient requested a call from [name]. Please advise

## 2015-09-07 NOTE — Progress Notes (Signed)
Pre visit review using our clinic review tool, if applicable. No additional management support is needed unless otherwise documented below in the visit note. 

## 2015-09-07 NOTE — Assessment & Plan Note (Signed)
Uncontrolled. Patient was given a glucometer today as it has strips. Increasing her NPH to 25 units twice a day. I encouraged her to slowly increase her NPH dose until her fasting sugars are below 200. I cautioned her about hypoglycemia.

## 2015-09-07 NOTE — Telephone Encounter (Signed)
Patient has c/o dysuria and polyuria, X 2 days stated she was treated in the ER for UTI in November, UA in chart but no culture seen and now states this feels the same as she felt in the ER. Scheduled with Dr. Lacinda Axon at 4.15 today.

## 2015-09-07 NOTE — Patient Instructions (Signed)
No evidence of UTI.  Increase your insulin to 25 twice daily.  Try and check your sugar at least once daily.  Follow up as indicated.  Take care  Dr. Lacinda Axon

## 2015-09-07 NOTE — Progress Notes (Signed)
Subjective:  Patient ID: [name], female    DOB: 03/26/1971  Age: 44 y.o. MRN: [id number]  CC: Dysuria, Polyuria  HPI:  44 year old female with past history of uncontrolled type 2 diabetes with complications presents to clinic today for an acute visit with complaints of dysuria and polyuria.  Patient reports that for the past 1-1-1/2 weeks she's been experiencing intermittent dysuria. Additionally, she's been urinating frequently. No associated fevers chills. No reports of back pain/flank pain. She has had some lower abdominal discomfort. No known exacerbating or relieving factors. She is concerned that she may have a UTI as she's had these frequently since recent gallbladder surgery. She states that her dysuria has currently resolved.  Also, her diabetes remains uncontrolled. She is currently taking NPH 20 units BID and Glipizide.  She is not taking her blood sugars as she cannot afford strips for her glucometer. She is experiencing significant polydipsia and polyuria.  Social Hx   Social History   Social History  . Marital Status: Single    Spouse Name: N/A  . Number of Children: N/A  . Years of Education: N/A   Social History Main Topics  . Smoking status: Current Every Day Smoker -- 0.25 packs/day for 6 years    Types: Cigarettes  . Smokeless tobacco: Never Used  . Alcohol Use: No  . Drug Use: No  . Sexual Activity: Not Asked   Other Topics Concern  . None   Social History Narrative   Review of Systems  Endocrine: Positive for polydipsia and polyuria.  Genitourinary: Positive for dysuria.   Objective:  BP 122/90 mmHg  Pulse 101  Temp(Src) 97.8 F (36.6 C) (Oral)  Ht 5\' 4"  (1.626 m)  Wt 178 lb 4 oz (80.854 kg)  BMI 30.58 kg/m2  SpO2 97%  BP/Weight 09/07/2015 07/29/2015 0000000  Systolic BP 123XX123 [id number]  Diastolic BP 90 84 78  Wt. (Lbs) 178.25 181 -  BMI 30.58 31.05 31.22   Physical Exam  Constitutional: She appears well-developed. No distress.    Cardiovascular: Normal rate and regular rhythm.   Pulmonary/Chest: Effort normal and breath sounds normal.  Abdominal: Soft. She exhibits no distension. There is no tenderness. There is no rebound and no guarding.  Neurological: She is alert.  Vitals reviewed.  Lab Results  Component Value Date   WBC 8.2 07/18/2015   HGB 13.0 07/18/2015   HCT 39.2 07/18/2015   PLT 291 07/18/2015   GLUCOSE 209* 07/18/2015   CHOL 209* 12/17/2013   TRIG 306.0* 12/17/2013   HDL 37.40* 12/17/2013   LDLDIRECT 124.0 03/17/2015   LDLCALC 110* 12/17/2013   ALT 17 07/18/2015   AST 19 07/18/2015   NA 136 07/18/2015   K 4.4 07/18/2015   CL 101 07/18/2015   CREATININE 0.63 07/18/2015   BUN 15 07/18/2015   CO2 27 07/18/2015   HGBA1C 14.0* 03/17/2015   MICROALBUR <0.7 03/17/2015    Assessment & Plan:   Problem List Items Addressed This Visit    Dysuria - Primary    New problem. Urinalysis was not consistent with infection. It was notable for >1000 glucose. Given lack of findings, I am not sending for culture (especially as patient has no insurance and would prefer to not have the cost). Her urinary tract symptoms are from uncontrolled diabetes.      Relevant Orders   POCT Urinalysis Dipstick (Completed)   Type 2 diabetes mellitus, uncontrolled, with neuropathy (HCC)    Uncontrolled. Patient was given a glucometer  today as it has strips. Increasing her NPH to 25 units twice a day. I encouraged her to slowly increase her NPH dose until her fasting sugars are below 200. I cautioned her about hypoglycemia.        Other Visit Diagnoses    Polyuria        Relevant Orders    POCT Urinalysis Dipstick (Completed)      Follow-up: As indicated previously; PRN.  [place]

## 2015-09-08 ENCOUNTER — Other Ambulatory Visit: Payer: Self-pay | Admitting: Internal Medicine

## 2015-09-20 ENCOUNTER — Other Ambulatory Visit: Payer: Self-pay | Admitting: Internal Medicine

## 2015-09-20 NOTE — Telephone Encounter (Signed)
Please advise refill? 

## 2015-09-20 NOTE — Telephone Encounter (Signed)
Refill for 30 days only.  OFFICE VISIT NEEDED prior to any more refills 

## 2015-09-23 ENCOUNTER — Other Ambulatory Visit: Payer: Self-pay

## 2015-09-23 MED ORDER — AMITRIPTYLINE HCL 25 MG PO TABS: 25.0000 mg | ORAL_TABLET | Freq: Every day | ORAL | Status: DC

## 2015-10-29 ENCOUNTER — Other Ambulatory Visit: Payer: Self-pay | Admitting: Internal Medicine

## 2015-11-01 NOTE — Telephone Encounter (Signed)
Medication was given on 03/17/15 for 90- day supple with 3 refills. Please advise?

## 2015-11-01 NOTE — Telephone Encounter (Signed)
Patient was called and stated she is going to see [name] due to her not having insurance.

## 2015-11-01 NOTE — Telephone Encounter (Signed)
Refill for 30 days only.  OFFICE VISIT NEEDED and fasting labs  prior to any more refills

## 2016-03-02 NOTE — Telephone Encounter (Signed)
error 

## 2016-05-22 ENCOUNTER — Emergency Department
Admission: EM | Admit: 2016-05-22 | Discharge: 2016-05-22 | Disposition: A | Discharge status: Home / Self Care | Payer: Self-pay | Attending: Emergency Medicine | Admitting: Emergency Medicine

## 2016-05-22 ENCOUNTER — Encounter: Payer: Self-pay | Admitting: Medical Oncology

## 2016-05-22 DIAGNOSIS — L0291 Cutaneous abscess, unspecified: Secondary | ICD-10-CM

## 2016-05-22 DIAGNOSIS — L03211 Cellulitis of face: Secondary | ICD-10-CM | POA: Insufficient documentation

## 2016-05-22 DIAGNOSIS — S51802A Unspecified open wound of left forearm, initial encounter: Secondary | ICD-10-CM | POA: Insufficient documentation

## 2016-05-22 DIAGNOSIS — Y999 Unspecified external cause status: Secondary | ICD-10-CM | POA: Insufficient documentation

## 2016-05-22 DIAGNOSIS — F424 Excoriation (skin-picking) disorder: Secondary | ICD-10-CM | POA: Insufficient documentation

## 2016-05-22 DIAGNOSIS — Y9389 Activity, other specified: Secondary | ICD-10-CM | POA: Insufficient documentation

## 2016-05-22 DIAGNOSIS — S51801A Unspecified open wound of right forearm, initial encounter: Secondary | ICD-10-CM | POA: Insufficient documentation

## 2016-05-22 DIAGNOSIS — Z79899 Other long term (current) drug therapy: Secondary | ICD-10-CM | POA: Insufficient documentation

## 2016-05-22 DIAGNOSIS — E119 Type 2 diabetes mellitus without complications: Secondary | ICD-10-CM | POA: Insufficient documentation

## 2016-05-22 DIAGNOSIS — Z23 Encounter for immunization: Secondary | ICD-10-CM | POA: Insufficient documentation

## 2016-05-22 DIAGNOSIS — Z794 Long term (current) use of insulin: Secondary | ICD-10-CM | POA: Insufficient documentation

## 2016-05-22 DIAGNOSIS — Y929 Unspecified place or not applicable: Secondary | ICD-10-CM | POA: Insufficient documentation

## 2016-05-22 DIAGNOSIS — F17210 Nicotine dependence, cigarettes, uncomplicated: Secondary | ICD-10-CM | POA: Insufficient documentation

## 2016-05-22 DIAGNOSIS — H44002 Unspecified purulent endophthalmitis, left eye: Secondary | ICD-10-CM | POA: Insufficient documentation

## 2016-05-22 DIAGNOSIS — W540XXA Bitten by dog, initial encounter: Secondary | ICD-10-CM | POA: Insufficient documentation

## 2016-05-22 DIAGNOSIS — L0390 Cellulitis, unspecified: Secondary | ICD-10-CM

## 2016-05-22 MED ORDER — TETANUS-DIPHTH-ACELL PERTUSSIS 5-2.5-18.5 LF-MCG/0.5 IM SUSP
0.5000 mL | Freq: Once | INTRAMUSCULAR | Status: AC
  Administered 2016-05-22: 0.5 mL via INTRAMUSCULAR
  Filled 2016-05-22: qty 0.5

## 2016-05-22 MED ORDER — METRONIDAZOLE 500 MG PO TABS: 500.0000 mg | ORAL_TABLET | Freq: Three times a day (TID) | ORAL | 0 refills | Status: DC

## 2016-05-22 MED ORDER — SULFAMETHOXAZOLE-TRIMETHOPRIM 800-160 MG PO TABS: 1.0000 | ORAL_TABLET | Freq: Two times a day (BID) | ORAL | 0 refills | Status: DC

## 2016-05-22 NOTE — ED Notes (Signed)
See triage note  States she noticed a red raised area to left temporal area a couple of days ago   Area has gotten larger .Marland Kitchen

## 2016-05-22 NOTE — ED Provider Notes (Signed)
[facility] Emergency Department Provider Note  ____________________________________________  Time seen: Approximately 7:31 AM  I have reviewed the triage vital signs and the nursing notes.   HISTORY  Chief Complaint Abscess    HPI [name] is a 45 y.o. female , NAD, presents to the emergency department with 2 day history of abscess to the left eyebrow.  Has attempted to squeeze the lesion to express fluid but notes that made the pain and swelling worse. Does not remember any specific bug bites or other injuries that would've caused this skin sore. Also notes she has a new puppy in which she has been playing with and it has been and scratched her forearms. Has been picking at these lesions causing them to get increasingly red and scabbed over. Puppy is up-to-date on all vaccinations and the patient notes she does not want rabies vaccination at this time. She is uncertain of when she received her last tetanus vaccination. Denies any fevers, chills, body aches. Has not had any changes in vision or eye discharge. Has not had any headaches, numbness, weakness, tingling. No chest pain, shortness of breath.   Past Medical History:  Diagnosis Date  . allergic rhinitis   . Anemia    H/O  . Depression   . Diabetes mellitus   . GERD (gastroesophageal reflux disease)   . Headache   . History of migraine headaches   . tobacco abuse   . UTI (urinary tract infection) 06-2015    Patient Active Problem List   Diagnosis Date Noted  . Dysuria 09/07/2015  . GAD (generalized anxiety disorder) 03/27/2015  . Menometrorrhagia 08/21/2011  . Depression   . GERD (gastroesophageal reflux disease)   . History of migraine headaches   . Screening for cervical cancer 07/18/2011  . Type 2 diabetes mellitus, uncontrolled, with neuropathy ([place])     Past Surgical History:  Procedure Laterality Date  . [place]   x 2,  breech, premature 7 wks  .  CHOLECYSTECTOMY N/A 07/22/2015   Procedure: LAPAROSCOPIC CHOLECYSTECTOMY ;  Surgeon: Florene Glen, MD;  Location: [facility] ORS;  Service: General;  Laterality: N/A;  . TONSILLECTOMY     as child  . TUBAL LIGATION  1998    Prior to Admission medications   Medication Sig Start Date End Date Taking? Authorizing Provider  ALPRAZolam Duanne Moron) 0.5 MG tablet TAKE ONE TABLET BY MOUTH AT BEDTIME AS NEEDED FOR ANXIETY 09/20/15   Crecencio Mc, MD  amitriptyline (ELAVIL) 25 MG tablet Take 1 tablet (25 mg total) by mouth at bedtime. Increase weekly by 25 mg as needed 09/23/15   Crecencio Mc, MD  gabapentin (NEURONTIN) 300 MG capsule Take 1 capsule (300 mg total) by mouth 3 (three) times daily. 03/31/15   Crecencio Mc, MD  glipiZIDE (GLUCOTROL) 10 MG tablet Take 1 tablet (10 mg total) by mouth 2 (two) times daily before a meal. 03/17/15   Crecencio Mc, MD  glucose blood test strip Use as instructed up to 4 times daily 04/14/13   Crecencio Mc, MD  INS SYRINGE/NEEDLE 1CC/28G (B-D INSULIN SYRINGE 1CC/28G) 28G X 1/2" 1 ML MISC 1 Syringe by Does not apply route daily after supper. 03/17/15   Crecencio Mc, MD  insulin NPH Human (NOVOLIN N) 100 UNIT/ML injection 20 units after dinner and 10 units in the morning 04/01/15   Crecencio Mc, MD  Lancets MISC Patient test blood sugar two times daily.  08/24/11   Crecencio Mc, MD  metroNIDAZOLE (FLAGYL) 500 MG tablet Take 1 tablet (500 mg total) by mouth 3 (three) times daily. 05/22/16 05/29/16  Jami L Hagler, PA-C  sertraline (ZOLOFT) 100 MG tablet TAKE ONE TABLET BY MOUTH ONCE DAILY 09/09/15   Crecencio Mc, MD  sulfamethoxazole-trimethoprim (BACTRIM DS,SEPTRA DS) 800-160 MG tablet Take 1 tablet by mouth 2 (two) times daily. 05/22/16   Jami L Hagler, PA-C    Allergies Review of patient's allergies indicates no known allergies.  Family History  Problem Relation Age of Onset  . Hypertension Mother   . Heart disease Father   . Hyperlipidemia Father   . Stroke Father    . Diabetes Father   . Hypertension Father     Social History Social History  Substance Use Topics  . Smoking status: Current Every Day Smoker    Packs/day: 0.25    Years: 6.00    Types: Cigarettes  . Smokeless tobacco: Never Used  . Alcohol use No     Review of Systems  Constitutional: No fever/chills Eyes: No visual changes. Cardiovascular: No chest pain. Respiratory:  No shortness of breath.  Musculoskeletal: Negative for General myalgias.  Skin: Positive skin sores bilateral forearms. Positive skin sore left eyebrow. Negative for rash, oozing, weeping. Neurological: Negative for headaches. 10-point ROS otherwise negative.  ____________________________________________   PHYSICAL EXAM:  VITAL SIGNS: ED Triage Vitals [05/22/16 0727]  Enc Vitals Group     BP (!) 144/85     Pulse Rate 97     Resp 17     Temp 98.1 F (36.7 C)     Temp Source Oral     SpO2 97 %     Weight 195 lb (88.5 kg)     Height 5\' 4"  (1.626 m)     Head Circumference      Peak Flow      Pain Score 8     Pain Loc      Pain Edu?      Excl. in [place]?      Constitutional: Alert and oriented. Well appearing and in no acute distress. Eyes: Conjunctivae are normal without icterus or injection. PERRLA. EOMI without pain.  Head: Atraumatic. Neck: Supple with full range of motion Hematological/Lymphatic/Immunilogical: No cervical lymphadenopathy. Cardiovascular:  Good peripheral circulation with 2+ pulses noted in bilateral upper extremities. Respiratory: Normal respiratory effort without tachypnea or retractions.  Musculoskeletal: Full range of motion of bilateral upper extremities without pain or difficulty. Neurologic:  Normal speech and language. No gross focal neurologic deficits are appreciated.  Skin:  1 cm annular abscess with central scabbed lesion noted about the left lateral eyebrow. No active oozing or weeping. The area is indurated without any fluctuance or active oozing or weeping. It  is tender to touch with mild erythema. Left forearm and right forearm with multiple annular scabbed lesions with mild erythema surrounding. No pain to palpation, fluctuance or induration. No active oozing or weeping of the sites. Skin is warm, dry. No rash noted. Psychiatric: Mood and affect are normal. Speech and behavior are normal. Patient exhibits appropriate insight and judgement.   ____________________________________________   LABS  None ____________________________________________  EKG  None ____________________________________________  RADIOLOGY  None ____________________________________________    PROCEDURES  Procedure(s) performed: None   Procedures   Medications  Tdap (BOOSTRIX) injection 0.5 mL (0.5 mLs Intramuscular Given 05/22/16 0747)     ____________________________________________   INITIAL IMPRESSION / ASSESSMENT AND PLAN / ED COURSE  Pertinent  labs & imaging results that were available during my care of the patient were reviewed by me and considered in my medical decision making (see chart for details).  Clinical Course    Patient's diagnosis is consistent with Abscess and cellulitis, skin picking habit, dog bite. Patient will be discharged home with prescriptions for metronidazole and Bactrim DS to take as directed. Patient is uninsured and unfortunately could not afford Augmentin and therefore secondary double coverage of dog bite wounds as well as the abscess about the left eyebrow will be covered by the Flagyl and Bactrim. Patient notes dog is up-to-date on vaccinations and declined rabies vaccinations today. Tetanus was updated today as a precaution. Patient is to follow up with her primary care provider in 48 hours if symptoms persist past this treatment course. Patient is given ED precautions to return to the ED for any worsening or new symptoms.    ____________________________________________  FINAL CLINICAL IMPRESSION(S) / ED  DIAGNOSES  Final diagnoses:  Abscess and cellulitis  Skin picking habit  Dog bite      NEW MEDICATIONS STARTED DURING THIS VISIT:  Discharge Medication List as of 05/22/2016  7:51 AM    START taking these medications   Details  metroNIDAZOLE (FLAGYL) 500 MG tablet Take 1 tablet (500 mg total) by mouth 3 (three) times daily., Starting Tue 05/22/2016, Until Tue 05/29/2016, Print    sulfamethoxazole-trimethoprim (BACTRIM DS,SEPTRA DS) 800-160 MG tablet Take 1 tablet by mouth 2 (two) times daily., Starting Tue 05/22/2016, Print             Judithe Modest [place], PA-C 05/22/16 0900    Earleen Newport, MD 05/22/16 [phone]

## 2016-05-22 NOTE — ED Triage Notes (Signed)
Pt has abscess to left eye brow x 3 days.

## 2016-05-24 ENCOUNTER — Encounter: Payer: Self-pay | Admitting: Emergency Medicine

## 2016-05-24 ENCOUNTER — Inpatient Hospital Stay
Admission: EM | Admit: 2016-05-24 | Discharge: 2016-05-27 | DRG: 603 | Disposition: A | Discharge status: Home / Self Care | Payer: Self-pay | Attending: Specialist | Admitting: Specialist

## 2016-05-24 ENCOUNTER — Emergency Department: Payer: Self-pay

## 2016-05-24 DIAGNOSIS — K219 Gastro-esophageal reflux disease without esophagitis: Secondary | ICD-10-CM | POA: Diagnosis present

## 2016-05-24 DIAGNOSIS — L0201 Cutaneous abscess of face: Secondary | ICD-10-CM | POA: Diagnosis present

## 2016-05-24 DIAGNOSIS — L03211 Cellulitis of face: Principal | ICD-10-CM | POA: Diagnosis present

## 2016-05-24 DIAGNOSIS — Z823 Family history of stroke: Secondary | ICD-10-CM

## 2016-05-24 DIAGNOSIS — F329 Major depressive disorder, single episode, unspecified: Secondary | ICD-10-CM | POA: Diagnosis present

## 2016-05-24 DIAGNOSIS — E11649 Type 2 diabetes mellitus with hypoglycemia without coma: Secondary | ICD-10-CM | POA: Diagnosis present

## 2016-05-24 DIAGNOSIS — Z833 Family history of diabetes mellitus: Secondary | ICD-10-CM

## 2016-05-24 DIAGNOSIS — Z79899 Other long term (current) drug therapy: Secondary | ICD-10-CM

## 2016-05-24 DIAGNOSIS — Z794 Long term (current) use of insulin: Secondary | ICD-10-CM

## 2016-05-24 DIAGNOSIS — Z87891 Personal history of nicotine dependence: Secondary | ICD-10-CM

## 2016-05-24 DIAGNOSIS — E871 Hypo-osmolality and hyponatremia: Secondary | ICD-10-CM | POA: Diagnosis present

## 2016-05-24 DIAGNOSIS — Z9049 Acquired absence of other specified parts of digestive tract: Secondary | ICD-10-CM

## 2016-05-24 DIAGNOSIS — Z87440 Personal history of urinary (tract) infections: Secondary | ICD-10-CM

## 2016-05-24 DIAGNOSIS — E1165 Type 2 diabetes mellitus with hyperglycemia: Secondary | ICD-10-CM | POA: Diagnosis present

## 2016-05-24 DIAGNOSIS — E1140 Type 2 diabetes mellitus with diabetic neuropathy, unspecified: Secondary | ICD-10-CM | POA: Diagnosis present

## 2016-05-24 DIAGNOSIS — IMO0002 Reserved for concepts with insufficient information to code with codable children: Secondary | ICD-10-CM

## 2016-05-24 DIAGNOSIS — E860 Dehydration: Secondary | ICD-10-CM | POA: Diagnosis present

## 2016-05-24 DIAGNOSIS — Z9889 Other specified postprocedural states: Secondary | ICD-10-CM

## 2016-05-24 DIAGNOSIS — Z8249 Family history of ischemic heart disease and other diseases of the circulatory system: Secondary | ICD-10-CM

## 2016-05-24 DIAGNOSIS — Z9851 Tubal ligation status: Secondary | ICD-10-CM

## 2016-05-24 LAB — CBC WITH DIFFERENTIAL/PLATELET
BASOS ABS: 0 10*3/uL (ref 0–0.1)
Basophils Relative: 0 %
EOS ABS: 0.2 10*3/uL (ref 0–0.7)
EOS PCT: 2 %
HCT: 49.4 % — ABNORMAL HIGH (ref 35.0–47.0)
Hemoglobin: 16.8 g/dL — ABNORMAL HIGH (ref 12.0–16.0)
LYMPHS PCT: 14 %
Lymphs Abs: 1.2 10*3/uL (ref 1.0–3.6)
MCH: 30 pg (ref 26.0–34.0)
MCHC: 34 g/dL (ref 32.0–36.0)
MCV: 88.1 fL (ref 80.0–100.0)
MONO ABS: 0.6 10*3/uL (ref 0.2–0.9)
Monocytes Relative: 7 %
Neutro Abs: 6.5 10*3/uL (ref 1.4–6.5)
Neutrophils Relative %: 77 %
PLATELETS: 275 10*3/uL (ref 150–440)
RBC: 5.6 MIL/uL — AB (ref 3.80–5.20)
RDW: 14.3 % (ref 11.5–14.5)
WBC: 8.5 10*3/uL (ref 3.6–11.0)

## 2016-05-24 LAB — URINALYSIS COMPLETE WITH MICROSCOPIC (ARMC ONLY)
BACTERIA UA: NONE SEEN
BILIRUBIN URINE: NEGATIVE
Hgb urine dipstick: NEGATIVE
LEUKOCYTES UA: NEGATIVE
NITRITE: NEGATIVE
Protein, ur: NEGATIVE mg/dL
Specific Gravity, Urine: 1.035 — ABNORMAL HIGH (ref 1.005–1.030)
pH: 6 (ref 5.0–8.0)

## 2016-05-24 LAB — BASIC METABOLIC PANEL
Anion gap: 14 (ref 5–15)
BUN: 11 mg/dL (ref 6–20)
CO2: 27 mmol/L (ref 22–32)
CREATININE: 0.9 mg/dL (ref 0.44–1.00)
Calcium: 9.7 mg/dL (ref 8.9–10.3)
Chloride: 91 mmol/L — ABNORMAL LOW (ref 101–111)
GFR calc Af Amer: >60 mL/min (ref >60)
GLUCOSE: 732 mg/dL — AB (ref 65–99)
Potassium: 4.3 mmol/L (ref 3.5–5.1)
SODIUM: 132 mmol/L — AB (ref 135–145)

## 2016-05-24 LAB — GLUCOSE, CAPILLARY
GLUCOSE-CAPILLARY: 305 mg/dL — AB (ref 65–99)
GLUCOSE-CAPILLARY: 220 mg/dL — AB (ref 65–99)

## 2016-05-24 LAB — POCT PREGNANCY, URINE: PREG TEST UR: NEGATIVE

## 2016-05-24 LAB — HEMOGLOBIN A1C: Hgb A1c MFr Bld: 13.2 % — ABNORMAL HIGH (ref 4.0–6.0)

## 2016-05-24 MED ORDER — SERTRALINE HCL 100 MG PO TABS
100.0000 mg | ORAL_TABLET | Freq: Every day | ORAL | Status: DC
  Administered 2016-05-24 – 2016-05-27 (×4): 100 mg via ORAL
  Filled 2016-05-24 (×5): qty 1

## 2016-05-24 MED ORDER — VANCOMYCIN HCL IN DEXTROSE 1-5 GM/200ML-% IV SOLN: 1000.0000 mg | Freq: Once | INTRAVENOUS | Status: DC

## 2016-05-24 MED ORDER — AMITRIPTYLINE HCL 25 MG PO TABS
25.0000 mg | ORAL_TABLET | Freq: Every day | ORAL | Status: DC
Start: 2016-05-24 — End: 2016-05-27
  Administered 2016-05-24 – 2016-05-26 (×3): 25 mg via ORAL
  Filled 2016-05-24 (×3): qty 1

## 2016-05-24 MED ORDER — ACETAMINOPHEN 325 MG PO TABS
650.0000 mg | ORAL_TABLET | Freq: Four times a day (QID) | ORAL | Status: DC | PRN
  Administered 2016-05-24: 650 mg via ORAL
  Filled 2016-05-24: qty 2

## 2016-05-24 MED ORDER — INSULIN ASPART 100 UNIT/ML ~~LOC~~ SOLN
0.0000 [IU] | Freq: Three times a day (TID) | SUBCUTANEOUS | Status: DC
  Administered 2016-05-24: 11 [IU] via SUBCUTANEOUS
  Administered 2016-05-25: 5 [IU] via SUBCUTANEOUS
  Administered 2016-05-25: 8 [IU] via SUBCUTANEOUS
  Administered 2016-05-26: 2 [IU] via SUBCUTANEOUS
  Administered 2016-05-26: 15 [IU] via SUBCUTANEOUS
  Administered 2016-05-26 – 2016-05-27 (×2): 8 [IU] via SUBCUTANEOUS
  Administered 2016-05-27: 11 [IU] via SUBCUTANEOUS
  Filled 2016-05-24: qty 11
  Filled 2016-05-24 (×2): qty 8
  Filled 2016-05-24: qty 20
  Filled 2016-05-24: qty 5
  Filled 2016-05-24: qty 2
  Filled 2016-05-24: qty 11
  Filled 2016-05-24: qty 15
  Filled 2016-05-24: qty 8

## 2016-05-24 MED ORDER — INSULIN DETEMIR 100 UNIT/ML ~~LOC~~ SOLN
70.0000 [IU] | Freq: Two times a day (BID) | SUBCUTANEOUS | Status: DC
  Administered 2016-05-24 – 2016-05-26 (×4): 70 [IU] via SUBCUTANEOUS
  Filled 2016-05-24 (×5): qty 0.7

## 2016-05-24 MED ORDER — GABAPENTIN 300 MG PO CAPS
300.0000 mg | ORAL_CAPSULE | Freq: Three times a day (TID) | ORAL | Status: DC
  Administered 2016-05-24 – 2016-05-27 (×9): 300 mg via ORAL
  Filled 2016-05-24 (×9): qty 1

## 2016-05-24 MED ORDER — PIPERACILLIN-TAZOBACTAM 3.375 G IVPB
3.3750 g | Freq: Three times a day (TID) | INTRAVENOUS | Status: DC
  Administered 2016-05-24 – 2016-05-26 (×5): 3.375 g via INTRAVENOUS
  Filled 2016-05-24 (×5): qty 50

## 2016-05-24 MED ORDER — INSULIN ASPART 100 UNIT/ML ~~LOC~~ SOLN
20.0000 [IU] | Freq: Once | SUBCUTANEOUS | Status: AC
  Administered 2016-05-24: 20 [IU] via INTRAVENOUS
  Filled 2016-05-24: qty 20

## 2016-05-24 MED ORDER — INSULIN ASPART 100 UNIT/ML ~~LOC~~ SOLN
20.0000 [IU] | Freq: Once | SUBCUTANEOUS | Status: AC
  Administered 2016-05-24: 20 [IU] via SUBCUTANEOUS

## 2016-05-24 MED ORDER — SODIUM CHLORIDE 0.9 % IV BOLUS (SEPSIS)
1000.0000 mL | Freq: Once | INTRAVENOUS | Status: AC
  Administered 2016-05-24: 1000 mL via INTRAVENOUS

## 2016-05-24 MED ORDER — VANCOMYCIN HCL IN DEXTROSE 1-5 GM/200ML-% IV SOLN
1000.0000 mg | Freq: Two times a day (BID) | INTRAVENOUS | Status: DC
  Administered 2016-05-24 – 2016-05-26 (×4): 1000 mg via INTRAVENOUS
  Filled 2016-05-24 (×6): qty 200

## 2016-05-24 MED ORDER — PIPERACILLIN-TAZOBACTAM 3.375 G IVPB 30 MIN: 3.3750 g | Freq: Once | INTRAVENOUS | Status: DC

## 2016-05-24 MED ORDER — ACETAMINOPHEN 650 MG RE SUPP: 650.0000 mg | Freq: Four times a day (QID) | RECTAL | Status: DC | PRN

## 2016-05-24 MED ORDER — VANCOMYCIN HCL IN DEXTROSE 1-5 GM/200ML-% IV SOLN
1000.0000 mg | INTRAVENOUS | Status: AC
  Administered 2016-05-24: 1000 mg via INTRAVENOUS
  Filled 2016-05-24: qty 200

## 2016-05-24 MED ORDER — KETOROLAC TROMETHAMINE 30 MG/ML IJ SOLN
30.0000 mg | Freq: Once | INTRAMUSCULAR | Status: AC
Start: 2016-05-24 — End: 2016-05-24
  Administered 2016-05-24: 30 mg via INTRAVENOUS
  Filled 2016-05-24: qty 1

## 2016-05-24 MED ORDER — OXYCODONE HCL 5 MG PO TABS
5.0000 mg | ORAL_TABLET | ORAL | Status: DC | PRN
  Administered 2016-05-24 – 2016-05-25 (×3): 5 mg via ORAL
  Filled 2016-05-24 (×3): qty 1

## 2016-05-24 MED ORDER — PIPERACILLIN-TAZOBACTAM 3.375 G IVPB 30 MIN
3.3750 g | Freq: Once | INTRAVENOUS | Status: AC
  Administered 2016-05-24: 3.375 g via INTRAVENOUS
  Filled 2016-05-24: qty 50

## 2016-05-24 MED ORDER — IOPAMIDOL (ISOVUE-300) INJECTION 61%
75.0000 mL | Freq: Once | INTRAVENOUS | Status: AC | PRN
  Administered 2016-05-24: 75 mL via INTRAVENOUS
  Filled 2016-05-24: qty 75

## 2016-05-24 MED ORDER — INSULIN ASPART 100 UNIT/ML ~~LOC~~ SOLN
0.0000 [IU] | Freq: Every day | SUBCUTANEOUS | Status: DC
  Administered 2016-05-24: 2 [IU] via SUBCUTANEOUS
  Administered 2016-05-25: 3 [IU] via SUBCUTANEOUS
  Filled 2016-05-24: qty 2
  Filled 2016-05-24: qty 3

## 2016-05-24 MED ORDER — SODIUM CHLORIDE 0.9 % IV BOLUS (SEPSIS)
1000.0000 mL | Freq: Once | INTRAVENOUS | Status: AC
Start: 2016-05-24 — End: 2016-05-24
  Administered 2016-05-24: 1000 mL via INTRAVENOUS

## 2016-05-24 MED ORDER — ENOXAPARIN SODIUM 40 MG/0.4ML ~~LOC~~ SOLN
40.0000 mg | SUBCUTANEOUS | Status: DC
  Administered 2016-05-24 – 2016-05-26 (×3): 40 mg via SUBCUTANEOUS
  Filled 2016-05-24 (×3): qty 0.4

## 2016-05-24 MED ORDER — SODIUM CHLORIDE 0.9 % IV SOLN
INTRAVENOUS | Status: DC
  Administered 2016-05-24 – 2016-05-26 (×4): via INTRAVENOUS

## 2016-05-24 NOTE — ED Notes (Signed)
Admitting MD at bedside.

## 2016-05-24 NOTE — Consult Note (Signed)
Patient ID: [name], female   DOB: 1971/03/05, 45 y.o.   MRN: [id number]  CC: FACIAL ABSCESS  HPI [name] is a 45 y.o. female who presents emergency department for evaluation of worsening facial cellulitis. Patient states that she noticed an area lateral to her left eye on Friday. At that time it was small but red. She attempted to pop it herself. She was seen in the ER earlier this week and started on oral antibiotics. The area has progressively worsened since then. She states that the area feels swollen and is painful. Earlier today her eye was swollen shut secondary to the swelling. The pain is a throbbing sensation. She is also diabetic and she was found to have highly uncontrolled blood sugars. She denies any fevers but has had chills and sweats. She denies any chest pain, shortness breath, diarrhea, constipation, abdominal pain.  HPI  Past Medical History:  Diagnosis Date  . allergic rhinitis   . Anemia    H/O  . Depression   . Diabetes mellitus   . GERD (gastroesophageal reflux disease)   . Headache   . History of migraine headaches   . tobacco abuse   . UTI (urinary tract infection) 06-2015    Past Surgical History:  Procedure Laterality Date  . [place]   x 2,  breech, premature 7 wks  . CHOLECYSTECTOMY N/A 07/22/2015   Procedure: LAPAROSCOPIC CHOLECYSTECTOMY ;  Surgeon: Florene Glen, MD;  Location: [facility] ORS;  Service: General;  Laterality: N/A;  . TONSILLECTOMY     as child  . TUBAL LIGATION  1998    Family History  Problem Relation Age of Onset  . Hypertension Mother   . Hypothyroidism Mother   . Thrombocytopenia Mother   . Heart disease Father   . Hyperlipidemia Father   . Stroke Father   . Diabetes Father   . Hypertension Father     Social History Social History  Substance Use Topics  . Smoking status: Former Smoker    Packs/day: 0.25    Years: 6.00    Types: Cigarettes  . Smokeless tobacco: Never Used  . Alcohol  use No    No Known Allergies  Current Facility-Administered Medications  Medication Dose Route Frequency Provider Last Rate Last Dose  . 0.9 %  sodium chloride infusion   Intravenous Continuous Loletha Grayer, MD      . acetaminophen (TYLENOL) tablet 650 mg  650 mg Oral Q6H PRN Loletha Grayer, MD       Or  . acetaminophen (TYLENOL) suppository 650 mg  650 mg Rectal Q6H PRN Loletha Grayer, MD      . amitriptyline (ELAVIL) tablet 25 mg  25 mg Oral QHS Loletha Grayer, MD      . enoxaparin (LOVENOX) injection 40 mg  40 mg Subcutaneous Q24H Loletha Grayer, MD      . gabapentin (NEURONTIN) capsule 300 mg  300 mg Oral TID Loletha Grayer, MD      . insulin aspart (novoLOG) injection 0-15 Units  0-15 Units Subcutaneous TID WC Loletha Grayer, MD      . insulin aspart (novoLOG) injection 0-5 Units  0-5 Units Subcutaneous QHS Loletha Grayer, MD      . insulin aspart (novoLOG) injection 20 Units  20 Units Intravenous Once Loletha Grayer, MD      . insulin detemir (LEVEMIR) injection 70 Units  70 Units Subcutaneous BID Loletha Grayer, MD      . oxyCODONE (Oxy  IR/ROXICODONE) immediate release tablet 5 mg  5 mg Oral Q4H PRN Loletha Grayer, MD      . piperacillin-tazobactam (ZOSYN) IVPB 3.375 g  3.375 g Intravenous Q8H Loletha Grayer, MD      . Derrill Memo ON 05/25/2016] sertraline (ZOLOFT) tablet 100 mg  100 mg Oral Daily Loletha Grayer, MD      . sodium chloride 0.9 % bolus 1,000 mL  1,000 mL Intravenous Once Loletha Grayer, MD      . vancomycin (VANCOCIN) IVPB 1000 mg/200 mL premix  1,000 mg Intravenous STAT Loletha Grayer, MD      . vancomycin (VANCOCIN) IVPB 1000 mg/200 mL premix  1,000 mg Intravenous Q12H Loletha Grayer, MD       Current Outpatient Prescriptions  Medication Sig Dispense Refill  . amitriptyline (ELAVIL) 100 MG tablet Take 100 mg by mouth at bedtime.    . gabapentin (NEURONTIN) 300 MG capsule Take 1 capsule (300 mg total) by mouth 3 (three) times daily. (Patient taking  differently: Take 600 mg by mouth 3 (three) times daily. ) 90 capsule 1  . glucose blood test strip Use as instructed up to 4 times daily 100 each 12  . hydrOXYzine (ATARAX/VISTARIL) 25 MG tablet Take 25-50 mg by mouth at bedtime.    . INS SYRINGE/NEEDLE 1CC/28G (B-D INSULIN SYRINGE 1CC/28G) 28G X 1/2" 1 ML MISC 1 Syringe by Does not apply route daily after supper. 100 each 0  . insulin aspart (NOVOLOG) 100 UNIT/ML injection Inject 40 Units into the skin 3 (three) times daily before meals.    . insulin detemir (LEVEMIR) 100 UNIT/ML injection Inject 70 Units into the skin 2 (two) times daily.    . Lancets MISC Patient test blood sugar two times daily. 100 each 5  . metroNIDAZOLE (FLAGYL) 500 MG tablet Take 1 tablet (500 mg total) by mouth 3 (three) times daily. 21 tablet 0  . sertraline (ZOLOFT) 100 MG tablet TAKE ONE TABLET BY MOUTH ONCE DAILY 30 tablet 3  . sulfamethoxazole-trimethoprim (BACTRIM DS,SEPTRA DS) 800-160 MG tablet Take 1 tablet by mouth 2 (two) times daily. 14 tablet 0     Review of Systems A Multi-point review of systems was asked and was negative except for the findings documented in the history of present illness  Physical Exam Blood pressure 133/82, pulse (!) 109, temperature 98.1 F (36.7 C), temperature source Oral, resp. rate 20, height 5\' 4"  (1.626 m), weight 88.5 kg (195 lb), last menstrual period 04/29/2016, SpO2 98 %. CONSTITUTIONAL: No acute distress. EYES: Pupils are equal, round, and reactive to light, Sclera are non-icteric. EARS, NOSE, MOUTH AND THROAT: The oropharynx is clear. The oral mucosa is pink and moist. Hearing is intact to voice. LYMPH NODES:  Lymph nodes in the neck are normal. RESPIRATORY:  Lungs are clear. There is normal respiratory effort, with equal breath sounds bilaterally, and without pathologic use of accessory muscles. CARDIOVASCULAR: Heart is regular without murmurs, gallops, or rubs. GI: The abdomen is soft, nontender, and nondistended.  There are no palpable masses. There is no hepatosplenomegaly. There are normal bowel sounds in all quadrants. GU: Rectal deferred.   MUSCULOSKELETAL: Normal muscle strength and tone. No cyanosis or edema.   SKIN: Obvious swelling just lateral to the left eye. The area does appear to be fluctuant for approximately 1 cm of the greatest area of swelling. There is erythema that goes down to her cheek and around her eye. Patient has numerous puncture wounds to her upper extremities without obvious infection. NEUROLOGIC:  Motor and sensation is grossly normal. Cranial nerves are grossly intact. PSYCH:  Oriented to person, place and time. Affect is normal.  Data Reviewed Images and labs reviewed. Labs concerning for a blood glucose of 732. Patient also has a hyponatremia of 132, hypochloremia of 91. The remainder of her labs are within normal limits. A CT of the face was obtained which does not show an obvious drainable fluid collection. I have personally reviewed the patient's imaging, laboratory findings and medical records.    Assessment    Left facial cellulitis    Plan    45 year old female with cellulitis of the left face and likely a superficial abscess lateral to her left eye. Discussed with the patient the possibility of draining the area of greatest swelling. After discussing the procedure with the patient patient marked that she does not think should be allowed to stay still for procedure without anesthesia. Given her uncontrolled blood sugar discussed with the patient that we would attempt to let antibiotics take care of this overnight. Should the area still be just a swollen in the morning we will try to take her to the operating room for incision and drainage under anesthesia. Her blood sugar will need to be much better controlled prior to the operating room due to the risks of anesthesia. Agree with medical admission, IV antibiotics, aggressive control of blood sugars. Surgery will follow  with you to determine whether not she needs to go the operating room tomorrowt.     Time spent with the patient was 80 minutes, with more than 50% of the time spent in face-to-face education, counseling and care coordination.     Clayburn Pert, MD FACS General Surgeon 05/24/2016, 2:54 PM

## 2016-05-24 NOTE — ED Provider Notes (Signed)
[facility] Emergency Department Provider Note  ____________________________________________  Time seen: Approximately 12:49 PM  I have reviewed the triage vital signs and the nursing notes.   HISTORY  Chief Complaint Abscess    HPI [name] is a 45 y.o. female , NAD, presents to the emergency department with worsening cellulitis and abscess to the left side of face. She was seen in this emergency department 2 days ago by myself and was placed on oral Bactrim DS and metronidazole. Patient states she has been taking the medication as prescribed and has not missed any doses but notes that the pain, redness and swelling has consistently been worsening. Has not noted any oozing or weeping at the site. Denies any fevers, chills, body aches. Has not had any difficulty swallowing or breathing. Denies chest pain, shortness breath, abdominal pain, nausea, vomiting. Has not had any numbness, weeks, tingling. Does note that she normally takes NovoLog and Lantus to control her blood sugars but has been out of her Lantus for approximately one week. Is uncertain of her blood glucose levels over the last couple of days.   Past Medical History:  Diagnosis Date  . allergic rhinitis   . Anemia    H/O  . Depression   . Diabetes mellitus   . GERD (gastroesophageal reflux disease)   . Headache   . History of migraine headaches   . tobacco abuse   . UTI (urinary tract infection) 06-2015    Patient Active Problem List   Diagnosis Date Noted  . Facial cellulitis 05/24/2016  . Dysuria 09/07/2015  . GAD (generalized anxiety disorder) 03/27/2015  . Menometrorrhagia 08/21/2011  . Depression   . GERD (gastroesophageal reflux disease)   . History of migraine headaches   . Screening for cervical cancer 07/18/2011  . Type 2 diabetes mellitus, uncontrolled, with neuropathy ([place])     Past Surgical History:  Procedure Laterality Date  . [place]   x 2,   breech, premature 7 wks  . CHOLECYSTECTOMY N/A 07/22/2015   Procedure: LAPAROSCOPIC CHOLECYSTECTOMY ;  Surgeon: Florene Glen, MD;  Location: [facility] ORS;  Service: General;  Laterality: N/A;  . TONSILLECTOMY     as child  . TUBAL LIGATION  1998    Prior to Admission medications   Medication Sig Start Date End Date Taking? Authorizing Provider  amitriptyline (ELAVIL) 100 MG tablet Take 100 mg by mouth at bedtime.   Yes Historical Provider, MD  gabapentin (NEURONTIN) 300 MG capsule Take 1 capsule (300 mg total) by mouth 3 (three) times daily. Patient taking differently: Take 600 mg by mouth 3 (three) times daily.  03/31/15  Yes Crecencio Mc, MD  glucose blood test strip Use as instructed up to 4 times daily 04/14/13  Yes Crecencio Mc, MD  INS SYRINGE/NEEDLE 1CC/28G (B-D INSULIN SYRINGE 1CC/28G) 28G X 1/2" 1 ML MISC 1 Syringe by Does not apply route daily after supper. 03/17/15  Yes Crecencio Mc, MD  insulin aspart (NOVOLOG) 100 UNIT/ML injection Inject 40 Units into the skin 3 (three) times daily before meals.   Yes Historical Provider, MD  insulin detemir (LEVEMIR) 100 UNIT/ML injection Inject 70 Units into the skin 2 (two) times daily.   Yes Historical Provider, MD  Lancets MISC Patient test blood sugar two times daily. 08/24/11  Yes Crecencio Mc, MD  metroNIDAZOLE (FLAGYL) 500 MG tablet Take 1 tablet (500 mg total) by mouth 3 (three) times daily. 05/22/16 05/29/16  Yes Floyd Wade L Sharrie Self, PA-C  sertraline (ZOLOFT) 100 MG tablet TAKE ONE TABLET BY MOUTH ONCE DAILY 09/09/15  Yes Crecencio Mc, MD  sulfamethoxazole-trimethoprim (BACTRIM DS,SEPTRA DS) 800-160 MG tablet Take 1 tablet by mouth 2 (two) times daily. 05/22/16  Yes Banjamin Stovall L Robin Petrakis, PA-C    Allergies Review of patient's allergies indicates no known allergies.  Family History  Problem Relation Age of Onset  . Hypertension Mother   . Hypothyroidism Mother   . Thrombocytopenia Mother   . Heart disease Father   . Hyperlipidemia Father    . Stroke Father   . Diabetes Father   . Hypertension Father     Social History Social History  Substance Use Topics  . Smoking status: Former Smoker    Packs/day: 0.25    Years: 6.00    Types: Cigarettes  . Smokeless tobacco: Never Used  . Alcohol use No     Review of Systems  Constitutional: No fever/chills, Fatigue Cardiovascular: No chest pain. Respiratory:  No shortness of breath. No wheezing.  Gastrointestinal: No abdominal pain.  No nausea, vomiting.   Musculoskeletal: Negative for general myalgias.  Skin: Positive skin sore with surrounding erythema, swelling and pain about the left side of face. No oozing or weeping. Neurological: Negative for headaches, focal weakness or numbness. No tingling. 10-point ROS otherwise negative.  ____________________________________________   PHYSICAL EXAM:  VITAL SIGNS: ED Triage Vitals  Enc Vitals Group     BP 05/24/16 1139 133/82     Pulse Rate 05/24/16 1139 (!) 109     Resp 05/24/16 1139 20     Temp 05/24/16 1139 98.1 F (36.7 C)     Temp Source 05/24/16 1139 Oral     SpO2 05/24/16 1139 98 %     Weight 05/24/16 1140 195 lb (88.5 kg)     Height 05/24/16 1140 5\' 4"  (1.626 m)     Head Circumference --      Peak Flow --      Pain Score 05/24/16 1140 10     Pain Loc --      Pain Edu? --      Excl. in [place]? --      Constitutional: Alert and oriented. Well appearing and in no acute distress. Eyes: Conjunctivae are normal. PERRLA. EOMI without pain.  Head: Atraumatic. Neck: Supple with full range of motion. Hematological/Lymphatic/Immunilogical: No cervical lymphadenopathy. Cardiovascular: Tachycardic rate, regular rhythm. Normal S1 and S2.  Good peripheral circulation. Respiratory: Normal respiratory effort without tachypnea or retractions. Lungs CTAB with breath sounds noted in all lung fields. Musculoskeletal: Full range of motion of upper and lower extremities without difficulty or pain. Neurologic:  Normal speech  and language. No gross focal neurologic deficits are appreciated. Gait and posture or normal. Skin:  1 cm oblong skin sore noted about the left lateral eyebrow with surrounding erythema and swelling. Area is significantly tender to palpation. No active oozing or weeping. Skin is warm, dry and intact. No rash noted. Psychiatric: Mood and affect are normal. Speech and behavior are normal. Patient exhibits appropriate insight and judgement.   ____________________________________________   LABS (all labs ordered are listed, but only abnormal results are displayed)  Labs Reviewed  BASIC METABOLIC PANEL - Abnormal; Notable for the following:       Result Value   Sodium 132 (*)    Chloride 91 (*)    Glucose, Bld 732 (*)    All other components within normal limits  CBC WITH DIFFERENTIAL/PLATELET - Abnormal;  Notable for the following:    RBC 5.60 (*)    Hemoglobin 16.8 (*)    HCT 49.4 (*)    All other components within normal limits  URINALYSIS COMPLETEWITH MICROSCOPIC ([facility] ONLY) - Abnormal; Notable for the following:    Color, Urine STRAW (*)    APPearance CLEAR (*)    Glucose, UA >500 (*)    Ketones, ur 1+ (*)    Specific Gravity, Urine 1.035 (*)    Squamous Epithelial / LPF 0-5 (*)    All other components within normal limits  HEMOGLOBIN A1C  POC URINE PREG, ED  POCT PREGNANCY, URINE   ____________________________________________  EKG  None ____________________________________________  RADIOLOGY I have personally viewed and evaluated these images (plain radiographs) as part of my medical decision making, as well as reviewing the written report by the radiologist.  Ct Maxillofacial W Contrast  Result Date: 05/24/2016 CLINICAL DATA:  LEFT temporal abscess since Friday, on antibiotics for 2 days, continues to get worse EXAM: CT MAXILLOFACIAL WITH CONTRAST TECHNIQUE: Multidetector CT imaging of the maxillofacial structures was performed with intravenous contrast. Multiplanar CT  image reconstructions were also generated. A small metallic BB was placed on the right temple in order to reliably differentiate right from left. A vitamin-E capsule was placed on the site of clinical concern. CONTRAST:  72mL ISOVUE-300 IOPAMIDOL (ISOVUE-300) INJECTION 61% IV COMPARISON:  None FINDINGS: Scattered beam hardening artifacts dental origin. Visualized intracranial contents normal appearance. Intraorbital soft tissue planes clear and symmetric. Focal area of skin thickening with underlying subcutaneous infiltration and thickening lateral to the LEFT orbit, extending to LEFT orbit as well as superolateral to the orbit. No defined subcutaneous fluid collection seen to suggest drainable abscess. Normal size cervical nodes bilaterally. Symmetric parotid and submandibular glands. Atherosclerotic calcifications at the carotid bifurcations. Minimal nasal septal deviation to the RIGHT. Paranasal sinuses, mastoid air cells, and middle ear cavities clear bilaterally. No fracture or bone destruction. IMPRESSION: Soft tissue swelling lateral to LEFT orbit compatible with inflammatory process/cellulitis ; no drainable defined abscess collection identified. Remainder of exam unremarkable. Electronically Signed   By: Lavonia Dana M.D.   On: 05/24/2016 13:37    ____________________________________________    PROCEDURES  Procedure(s) performed: None   Procedures   Medications  enoxaparin (LOVENOX) injection 40 mg (not administered)  acetaminophen (TYLENOL) tablet 650 mg (not administered)    Or  acetaminophen (TYLENOL) suppository 650 mg (not administered)  insulin detemir (LEVEMIR) injection 70 Units (not administered)  insulin aspart (novoLOG) injection 20 Units (not administered)  insulin aspart (novoLOG) injection 0-15 Units (not administered)  insulin aspart (novoLOG) injection 0-5 Units (not administered)  vancomycin (VANCOCIN) IVPB 1000 mg/200 mL premix (not administered)  sertraline  (ZOLOFT) tablet 100 mg (not administered)  amitriptyline (ELAVIL) tablet 25 mg (not administered)  gabapentin (NEURONTIN) capsule 300 mg (not administered)  vancomycin (VANCOCIN) IVPB 1000 mg/200 mL premix (not administered)  piperacillin-tazobactam (ZOSYN) IVPB 3.375 g (not administered)  oxyCODONE (Oxy IR/ROXICODONE) immediate release tablet 5 mg (not administered)  sodium chloride 0.9 % bolus 1,000 mL (not administered)  0.9 %  sodium chloride infusion (not administered)  sodium chloride 0.9 % bolus 1,000 mL (1,000 mLs Intravenous New Bag/Given 05/24/16 1202)  piperacillin-tazobactam (ZOSYN) IVPB 3.375 g (0 g Intravenous Stopped 05/24/16 1255)  ketorolac (TORADOL) 30 MG/ML injection 30 mg (30 mg Intravenous Given 05/24/16 1208)  sodium chloride 0.9 % bolus 1,000 mL (1,000 mLs Intravenous New Bag/Given 05/24/16 1255)  iopamidol (ISOVUE-300) 61 % injection 75 mL (75  mLs Intravenous Contrast Given 05/24/16 1319)     ____________________________________________   INITIAL IMPRESSION / ASSESSMENT AND PLAN / ED COURSE  Pertinent labs & imaging results that were available during my care of the patient were reviewed by me and considered in my medical decision making (see chart for details).  Clinical Course  Comment By Time  Critical glucose value of 749 was called to the nursing station. Another liter of saline was ordered. Braxton Feathers, PA-C 09/07 1250  I spoke with Dr. Reita Cliche in regards to the patient's history, presentation, physical exam and lab results. She recommends that the patient be admitted to the hospital due to failure of outpatient treatment of cellulitis as well as uncontrolled diabetes. Braxton Feathers, PA-C 09/07 1315    Patient's diagnosis is consistent with Cellulitis of the face in the setting of uncontrolled diabetes. Patient will be admitted to the hospitalist service for further treatment of uncontrolled diabetes as well as infection.      Braxton Feathers, PA-C 05/24/16  [place], MD 05/24/16 1537

## 2016-05-24 NOTE — ED Notes (Signed)
Informed RN bed ready 

## 2016-05-24 NOTE — H&P (Signed)
[place] [facility] [place] NAME: [name]    MR#:  [id number]  DATE OF BIRTH:  June 22, 1971  DATE OF ADMISSION:  05/24/2016  PRIMARY CARE PHYSICIAN: [place]   REQUESTING/REFERRING PHYSICIAN: Dr Lisa Roca  CHIEF COMPLAINT:   Chief Complaint  Patient presents with  . Abscess    HISTORY OF PRESENT ILLNESS:  [name]  is a 45 y.o. female patient presents back to the hospital with worsening cellulitis failing outpatient treatment with oral antibiotics. Patient states a spot on her left lateral forehead near her eyes started developing on Friday. She came to the ER on Tuesday was given oral antibiotics which looks like Bactrim and Flagyl. It got worse. Swelling and redness around the eye. Patient's eye was swollen shut this morning. Her sugars were also found to be above 700 when she came in. Patient states that she's been without her Levemir for over a week. No fever. Positive for chills and sweats.  PAST MEDICAL HISTORY:   Past Medical History:  Diagnosis Date  . allergic rhinitis   . Anemia    H/O  . Depression   . Diabetes mellitus   . GERD (gastroesophageal reflux disease)   . Headache   . History of migraine headaches   . tobacco abuse   . UTI (urinary tract infection) 06-2015    PAST SURGICAL HISTORY:   Past Surgical History:  Procedure Laterality Date  . [place]   x 2,  breech, premature 7 wks  . CHOLECYSTECTOMY N/A 07/22/2015   Procedure: LAPAROSCOPIC CHOLECYSTECTOMY ;  Surgeon: Florene Glen, MD;  Location: [facility] ORS;  Service: General;  Laterality: N/A;  . TONSILLECTOMY     as child  . TUBAL LIGATION  1998    SOCIAL HISTORY:   Social History  Substance Use Topics  . Smoking status: Former Smoker    Packs/day: 0.25    Years: 6.00    Types: Cigarettes  . Smokeless tobacco: Never Used  . Alcohol use No    FAMILY HISTORY:   Family History  Problem Relation Age  of Onset  . Hypertension Mother   . Hypothyroidism Mother   . Thrombocytopenia Mother   . Heart disease Father   . Hyperlipidemia Father   . Stroke Father   . Diabetes Father   . Hypertension Father     DRUG ALLERGIES:  No Known Allergies  REVIEW OF SYSTEMS:  CONSTITUTIONAL: No fever. Positive for chills and sweats. fatigue or weakness.  EYES: No blurred or double vision.  EARS, NOSE, AND THROAT: No tinnitus or ear pain. No sore throat. Positive for dysphagia to solids and has to drink liquids in order to force it down. RESPIRATORY: No cough, shortness of breath, wheezing or hemoptysis.  CARDIOVASCULAR: No chest pain, orthopnea, edema.  GASTROINTESTINAL: No nausea, vomiting, diarrhea. Positive for abdominal pain and constipation. No blood in bowel movements GENITOURINARY: No dysuria, hematuria.  ENDOCRINE: No polyuria, nocturia,  HEMATOLOGY: No anemia, easy bruising or bleeding SKIN: No rash or lesion. MUSCULOSKELETAL: No joint pain or arthritis.   NEUROLOGIC: History of neuropathy in the lower extremities. PSYCHIATRY: No anxiety or depression.   MEDICATIONS AT HOME:   Prior to Admission medications   Medication Sig Start Date End Date Taking? Authorizing Provider  ALPRAZolam Duanne Moron) 0.5 MG tablet TAKE ONE TABLET BY MOUTH AT BEDTIME AS NEEDED FOR ANXIETY 09/20/15   Crecencio Mc, MD  amitriptyline (ELAVIL) 25 MG tablet  Take 1 tablet (25 mg total) by mouth at bedtime. Increase weekly by 25 mg as needed 09/23/15   Crecencio Mc, MD  gabapentin (NEURONTIN) 300 MG capsule Take 1 capsule (300 mg total) by mouth 3 (three) times daily. 03/31/15   Crecencio Mc, MD  glipiZIDE (GLUCOTROL) 10 MG tablet Take 1 tablet (10 mg total) by mouth 2 (two) times daily before a meal. 03/17/15   Crecencio Mc, MD  glucose blood test strip Use as instructed up to 4 times daily 04/14/13   Crecencio Mc, MD  INS SYRINGE/NEEDLE 1CC/28G (B-D INSULIN SYRINGE 1CC/28G) 28G X 1/2" 1 ML MISC 1 Syringe by Does  not apply route daily after supper. 03/17/15   Crecencio Mc, MD  insulin NPH Human (NOVOLIN N) 100 UNIT/ML injection 20 units after dinner and 10 units in the morning 04/01/15   Crecencio Mc, MD  Lancets MISC Patient test blood sugar two times daily. 08/24/11   Crecencio Mc, MD  metroNIDAZOLE (FLAGYL) 500 MG tablet Take 1 tablet (500 mg total) by mouth 3 (three) times daily. 05/22/16 05/29/16  Jami L Hagler, PA-C  sertraline (ZOLOFT) 100 MG tablet TAKE ONE TABLET BY MOUTH ONCE DAILY 09/09/15   Crecencio Mc, MD  sulfamethoxazole-trimethoprim (BACTRIM DS,SEPTRA DS) 800-160 MG tablet Take 1 tablet by mouth 2 (two) times daily. 05/22/16   Jami L Hagler, PA-C    Med rec to be completed by pharmacy tech.  VITAL SIGNS:  Blood pressure 133/82, pulse (!) 109, temperature 98.1 F (36.7 C), temperature source Oral, resp. rate 20, height 5\' 4"  (1.626 m), weight 88.5 kg (195 lb), last menstrual period 04/29/2016, SpO2 98 %.  PHYSICAL EXAMINATION:  GENERAL:  45 y.o.-year-old patient lying in the bed with no acute distress.  EYES: Pupils equal, round, reactive to light and accommodation. No scleral icterus. Extraocular muscles intact.  HEENT: Head atraumatic, normocephalic. Oropharynx and nasopharynx clear.  NECK:  Supple, no jugular venous distention. No thyroid enlargement, no tenderness.  LUNGS: Normal breath sounds bilaterally, no wheezing, rales,rhonchi or crepitation. No use of accessory muscles of respiration.  CARDIOVASCULAR: S1, S2 normal. No murmurs, rubs, or gallops.  ABDOMEN: Soft, nontender, nondistended. Bowel sounds present. No organomegaly or mass.  EXTREMITIES: No pedal edema, cyanosis, or clubbing.  NEUROLOGIC: Cranial nerves II through XII are intact. Muscle strength 5/5 in all extremities. Sensation intact. Gait not checked.  PSYCHIATRIC: The patient is alert and oriented x 3.  SKIN: Large swelling left lateral head beside the eye. Fluctuant abscess felt. Surrounding erythema into the  left cheek and eyelids. On arms numerous scabs without surrounding erythema. Patient states that her dog ends up jumping on her a lot.  LABORATORY PANEL:   CBC  Recent Labs Lab 05/24/16 1200  WBC 8.5  HGB 16.8*  HCT 49.4*  PLT 275   ------------------------------------------------------------------------------------------------------------------  Chemistries   Recent Labs Lab 05/24/16 1200  NA 132*  K 4.3  CL 91*  CO2 27  GLUCOSE 732*  BUN 11  CREATININE 0.90  CALCIUM 9.7   ------------------------------------------------------------------------------------------------------------------    RADIOLOGY:  Ct Maxillofacial W Contrast  Result Date: 05/24/2016 CLINICAL DATA:  LEFT temporal abscess since Friday, on antibiotics for 2 days, continues to get worse EXAM: CT MAXILLOFACIAL WITH CONTRAST TECHNIQUE: Multidetector CT imaging of the maxillofacial structures was performed with intravenous contrast. Multiplanar CT image reconstructions were also generated. A small metallic BB was placed on the right temple in order to reliably differentiate right from left.  A vitamin-E capsule was placed on the site of clinical concern. CONTRAST:  36mL ISOVUE-300 IOPAMIDOL (ISOVUE-300) INJECTION 61% IV COMPARISON:  None FINDINGS: Scattered beam hardening artifacts dental origin. Visualized intracranial contents normal appearance. Intraorbital soft tissue planes clear and symmetric. Focal area of skin thickening with underlying subcutaneous infiltration and thickening lateral to the LEFT orbit, extending to LEFT orbit as well as superolateral to the orbit. No defined subcutaneous fluid collection seen to suggest drainable abscess. Normal size cervical nodes bilaterally. Symmetric parotid and submandibular glands. Atherosclerotic calcifications at the carotid bifurcations. Minimal nasal septal deviation to the RIGHT. Paranasal sinuses, mastoid air cells, and middle ear cavities clear bilaterally. No  fracture or bone destruction. IMPRESSION: Soft tissue swelling lateral to LEFT orbit compatible with inflammatory process/cellulitis ; no drainable defined abscess collection identified. Remainder of exam unremarkable. Electronically Signed   By: Lavonia Dana M.D.   On: 05/24/2016 13:37    EKG:   Not done.  IMPRESSION AND PLAN:   1. Facial abscess and cellulitis which failed outpatient oral therapy. I will have general surgery consultation. Aggressive antibiotics with vancomycin and Zosyn for right now. Warm compresses to left forehead. Pain control with oral medications 2. Uncontrolled diabetes mellitus secondary to not being able to afford medications. I'll get social worker involved. At on a hemoglobin A1c. Restart Levemir insulin and sliding scale. Give IV fluid hydration. Give 20 units of IV NovoLog. 3. Depression on amitriptyline and Zoloft 4. Diabetic neuropathy on gabapentin 5. Hyponatremia secondary to uncontrolled diabetes 6. Hemoconcentration likely dehydration on labs.   All the records are reviewed and case discussed with ED provider. Management plans discussed with the patient, family and they are in agreement.  CODE STATUS: Full code  TOTAL TIME TAKING CARE OF THIS PATIENT: 50  minutes.    Loletha Grayer M.D on 05/24/2016 at 2:08 PM  Between 7am to 6pm - Pager - [phone]  After 6pm call admission pager [phone]  [facility]  [phone]  CC: Primary care physician; [place]

## 2016-05-24 NOTE — ED Triage Notes (Signed)
Was seen on Tuesday for abscess to left side of face  Abscess area is larger and redness noted under left eye

## 2016-05-24 NOTE — Progress Notes (Signed)
Spoke with staff RN on [place] about patient. Was transferred to floor from ED. Patient's blood sugar in ED was 732 mg/dl.  Was given Novolog IV 20 units in the ED.  Was given Novolog 20 units SQ on the floor. Blood sugar at that time is 463 mg/dl.  Orders for Levemir and Novolog have been entered. Recommend that staff RN check CBGs to make sure blood sugars decreasing after SQ insulin. Diabetes Coordinator will follow up with patient tomorrow.  Harvel Ricks RN BSN CDE

## 2016-05-24 NOTE — ED Notes (Signed)
PA at bedside.

## 2016-05-24 NOTE — Progress Notes (Signed)
Pharmacy Antibiotic Note  SHERIYAH CEDANO is a 45 y.o. female admitted on 05/24/2016 with cellulitis, r/o osteo.  Pharmacy has been consulted for vancomycin and zosyn dosing.  Plan: Vancomycin 1000 IV every 12 hours with stacked dosing. Trough with the 5th dose. Will aim for higher trough until r/o abscess, osteo.  Goal trough 15-20 mcg/mL. Zosyn 3.375g IV q8h (4 hour infusion).  Height: 5\' 4"  (162.6 cm) Weight: 195 lb (88.5 kg) IBW/kg (Calculated) : 54.7  Temp (24hrs), Avg:98.1 F (36.7 C), Min:98.1 F (36.7 C), Max:98.1 F (36.7 C)   Recent Labs Lab 05/24/16 1200  WBC 8.5  CREATININE 0.90    Estimated Creatinine Clearance: 83.2 mL/min (by C-G formula based on SCr of 0.9 mg/dL).    No Known Allergies  Antimicrobials this admission: Vancomycin 9/7 >>  Zosyn 9/7 >>   Dose adjustments this admission:   Microbiology results:  Thank you for allowing pharmacy to be a part of this patient's care.  Ulice Dash D 05/24/2016 2:10 PM

## 2016-05-25 ENCOUNTER — Inpatient Hospital Stay: Payer: Self-pay | Admitting: Anesthesiology

## 2016-05-25 ENCOUNTER — Encounter
Admission: EM | Disposition: A | Discharge status: Home / Self Care | Payer: Self-pay | Source: Home / Self Care | Attending: Internal Medicine

## 2016-05-25 ENCOUNTER — Encounter: Payer: Self-pay | Admitting: *Deleted

## 2016-05-25 HISTORY — PX: INCISION AND DRAINAGE ABSCESS: SHX5864

## 2016-05-25 LAB — GLUCOSE, CAPILLARY
GLUCOSE-CAPILLARY: 247 mg/dL — AB (ref 65–99)
Glucose-Capillary: 463 mg/dL — ABNORMAL HIGH (ref 65–99)
Glucose-Capillary: 233 mg/dL — ABNORMAL HIGH (ref 65–99)
Glucose-Capillary: 215 mg/dL — ABNORMAL HIGH (ref 65–99)
Glucose-Capillary: 293 mg/dL — ABNORMAL HIGH (ref 65–99)
Glucose-Capillary: 269 mg/dL — ABNORMAL HIGH (ref 65–99)

## 2016-05-25 LAB — CBC
HCT: 40.3 % (ref 35.0–47.0)
Hemoglobin: 14.2 g/dL (ref 12.0–16.0)
MCH: 29.8 pg (ref 26.0–34.0)
MCHC: 35.2 g/dL (ref 32.0–36.0)
MCV: 84.5 fL (ref 80.0–100.0)
PLATELETS: 223 10*3/uL (ref 150–440)
RBC: 4.76 MIL/uL (ref 3.80–5.20)
RDW: 14 % (ref 11.5–14.5)
WBC: 8 10*3/uL (ref 3.6–11.0)

## 2016-05-25 LAB — BASIC METABOLIC PANEL
Anion gap: 8 (ref 5–15)
BUN: 9 mg/dL (ref 6–20)
CALCIUM: 8 mg/dL — AB (ref 8.9–10.3)
CO2: 23 mmol/L (ref 22–32)
CREATININE: 0.48 mg/dL (ref 0.44–1.00)
Chloride: 102 mmol/L (ref 101–111)
GFR calc Af Amer: >60 mL/min (ref >60)
GLUCOSE: 261 mg/dL — AB (ref 65–99)
Potassium: 3.4 mmol/L — ABNORMAL LOW (ref 3.5–5.1)
SODIUM: 133 mmol/L — AB (ref 135–145)

## 2016-05-25 SURGERY — INCISION AND DRAINAGE, ABSCESS
Anesthesia: General | Laterality: Left | Wound class: Dirty or Infected

## 2016-05-25 MED ORDER — ONDANSETRON HCL 4 MG/2ML IJ SOLN
INTRAMUSCULAR | Status: DC | PRN
  Administered 2016-05-25: 4 mg via INTRAVENOUS

## 2016-05-25 MED ORDER — FENTANYL CITRATE (PF) 100 MCG/2ML IJ SOLN: 25.0000 ug | INTRAMUSCULAR | Status: DC | PRN

## 2016-05-25 MED ORDER — FENTANYL CITRATE (PF) 100 MCG/2ML IJ SOLN
INTRAMUSCULAR | Status: DC | PRN
  Administered 2016-05-25: 100 ug via INTRAVENOUS

## 2016-05-25 MED ORDER — MIDAZOLAM HCL 2 MG/2ML IJ SOLN
INTRAMUSCULAR | Status: DC | PRN
  Administered 2016-05-25: 2 mg via INTRAVENOUS

## 2016-05-25 MED ORDER — MORPHINE SULFATE (PF) 2 MG/ML IV SOLN
2.0000 mg | INTRAVENOUS | Status: DC | PRN
  Administered 2016-05-25 (×4): 2 mg via INTRAVENOUS
  Filled 2016-05-25 (×4): qty 1

## 2016-05-25 MED ORDER — METFORMIN HCL 500 MG PO TABS: 500.0000 mg | ORAL_TABLET | Freq: Two times a day (BID) | ORAL | Status: DC

## 2016-05-25 MED ORDER — HYDROMORPHONE HCL 1 MG/ML IJ SOLN
INTRAMUSCULAR | Status: DC | PRN
  Administered 2016-05-25: 0.5 mg via INTRAVENOUS

## 2016-05-25 MED ORDER — LIDOCAINE HCL (CARDIAC) 20 MG/ML IV SOLN
INTRAVENOUS | Status: DC | PRN
  Administered 2016-05-25: 100 mg via INTRAVENOUS

## 2016-05-25 MED ORDER — HYDROCODONE-ACETAMINOPHEN 5-325 MG PO TABS
1.0000 | ORAL_TABLET | ORAL | Status: DC | PRN
  Administered 2016-05-26: 2 via ORAL
  Administered 2016-05-26: 1 via ORAL
  Administered 2016-05-26 – 2016-05-27 (×2): 2 via ORAL
  Filled 2016-05-25: qty 1
  Filled 2016-05-25 (×3): qty 2

## 2016-05-25 MED ORDER — PROPOFOL 10 MG/ML IV BOLUS
INTRAVENOUS | Status: DC | PRN
  Administered 2016-05-25: 200 mg via INTRAVENOUS

## 2016-05-25 MED ORDER — PROMETHAZINE HCL 25 MG/ML IJ SOLN: 6.2500 mg | INTRAMUSCULAR | Status: DC | PRN

## 2016-05-25 MED ORDER — LACTATED RINGERS IV SOLN
INTRAVENOUS | Status: DC | PRN
  Administered 2016-05-25: 12:00:00 via INTRAVENOUS

## 2016-05-25 SURGICAL SUPPLY — 21 items
BLADE SURG 15 STRL LF DISP TIS (BLADE) ×1 IMPLANT
BLADE SURG 15 STRL SS (BLADE) ×2
CANISTER SUCT 1200ML W/VALVE (MISCELLANEOUS) ×3 IMPLANT
CHLORAPREP W/TINT 26ML (MISCELLANEOUS) ×3 IMPLANT
DRAPE LAPAROTOMY 100X77 ABD (DRAPES) ×3 IMPLANT
ELECT CAUTERY BLADE 6.4 (BLADE) ×3 IMPLANT
ELECT REM PT RETURN 9FT ADLT (ELECTROSURGICAL) ×3
ELECTRODE REM PT RTRN 9FT ADLT (ELECTROSURGICAL) ×1 IMPLANT
GAUZE SPONGE 4X4 12PLY STRL (GAUZE/BANDAGES/DRESSINGS) ×3 IMPLANT
GLOVE BIO SURGEON STRL SZ7.5 (GLOVE) ×3 IMPLANT
GLOVE INDICATOR 8.0 STRL GRN (GLOVE) ×3 IMPLANT
GOWN STRL REUS W/ TWL LRG LVL3 (GOWN DISPOSABLE) ×2 IMPLANT
GOWN STRL REUS W/TWL LRG LVL3 (GOWN DISPOSABLE) ×4
KIT RM TURNOVER STRD PROC AR (KITS) ×3 IMPLANT
LABEL OR SOLS (LABEL) ×3 IMPLANT
NEEDLE HYPO 25X1 1.5 SAFETY (NEEDLE) ×3 IMPLANT
NS IRRIG 500ML POUR BTL (IV SOLUTION) ×3 IMPLANT
PACK BASIN MINOR ARMC (MISCELLANEOUS) ×3 IMPLANT
SUT ETHILON 2 0 FS 18 (SUTURE) IMPLANT
SYR BULB EAR ULCER 3OZ GRN STR (SYRINGE) ×3 IMPLANT
SYRINGE 10CC LL (SYRINGE) ×3 IMPLANT

## 2016-05-25 NOTE — Brief Op Note (Signed)
05/24/2016 - 05/25/2016  12:15 PM  PATIENT:  [name]  45 y.o. female  PRE-OPERATIVE DIAGNOSIS:  facial abscess  POST-OPERATIVE DIAGNOSIS:  facial abscess  PROCEDURE:  Procedure(s): INCISION AND DRAINAGE ABSCESS (Left)  SURGEON:  Surgeon(s) and Role:    * Clayburn Pert, MD - Primary  PHYSICIAN ASSISTANT:   ASSISTANTS: none   ANESTHESIA:   general  EBL:  Total I/O In: 1000 [I.V.:1000] Out: 1 [Blood:1]  BLOOD ADMINISTERED:none  DRAINS: none   LOCAL MEDICATIONS USED:  NONE  SPECIMEN:  Source of Specimen:  culture of abscess fluid  DISPOSITION OF SPECIMEN:  micro  COUNTS:  YES  TOURNIQUET:  * No tourniquets in log *  DICTATION: .Dragon Dictation  PLAN OF CARE: return to inpatient status  PATIENT DISPOSITION:  PACU - hemodynamically stable.   Delay start of Pharmacological VTE agent (>24hrs) due to surgical blood loss or risk of bleeding: no

## 2016-05-25 NOTE — Anesthesia Preprocedure Evaluation (Signed)
Anesthesia Evaluation  Patient identified by MRN, date of birth, ID band Patient awake    Reviewed: Allergy & Precautions, H&P , NPO status , Patient's Chart, lab work & pertinent test results, reviewed documented beta blocker date and time   History of Anesthesia Complications Negative for: history of anesthetic complications  Airway Mallampati: III  TM Distance: >3 FB Neck ROM: full    Dental no notable dental hx. (+) Poor Dentition, Missing   Pulmonary neg pulmonary ROS, former smoker,    Pulmonary exam normal breath sounds clear to auscultation       Cardiovascular Exercise Tolerance: Good negative cardio ROS Normal cardiovascular exam Rhythm:regular Rate:Normal     Neuro/Psych PSYCHIATRIC DISORDERS (Depression and anxiety) negative neurological ROS     GI/Hepatic Neg liver ROS, GERD  Medicated,  Endo/Other  diabetes, Poorly Controlled, Insulin Dependent  Renal/GU negative Renal ROS  negative genitourinary   Musculoskeletal   Abdominal   Peds  Hematology negative hematology ROS (+)   Anesthesia Other Findings Past Medical History: No date: allergic rhinitis No date: Anemia     Comment: H/O No date: Depression No date: Diabetes mellitus No date: GERD (gastroesophageal reflux disease) No date: Headache No date: History of migraine headaches No date: tobacco abuse 06-2015: UTI (urinary tract infection)   Reproductive/Obstetrics negative OB ROS                             Anesthesia Physical Anesthesia Plan  ASA: III  Anesthesia Plan: General   Post-op Pain Management:    Induction:   Airway Management Planned:   Additional Equipment:   Intra-op Plan:   Post-operative Plan:   Informed Consent: I have reviewed the patients History and Physical, chart, labs and discussed the procedure including the risks, benefits and alternatives for the proposed anesthesia with the  patient or authorized representative who has indicated his/her understanding and acceptance.   Dental Advisory Given  Plan Discussed with: Anesthesiologist, CRNA and Surgeon  Anesthesia Plan Comments:         Anesthesia Quick Evaluation

## 2016-05-25 NOTE — Transfer of Care (Signed)
Immediate Anesthesia Transfer of Care Note  Patient: [name]  Procedure(s) Performed: Procedure(s): INCISION AND DRAINAGE ABSCESS (Left)  Patient Location: PACU  Anesthesia Type:General  Level of Consciousness: sedated  Airway & Oxygen Therapy: Patient Spontanous Breathing and Patient connected to nasal cannula oxygen  Post-op Assessment: Report given to RN and Post -op Vital signs reviewed and stable  Post vital signs: Reviewed and stable  Last Vitals:  Vitals:   05/25/16 0825 05/25/16 1123  BP: (!) 145/78 (!) 154/99  Pulse: 90 90  Resp: 18 20  Temp: 36.8 C 36.3 C    Last Pain:  Vitals:   05/25/16 1123  TempSrc: Oral  PainSc: 10-Worst pain ever         Complications: No apparent anesthesia complications

## 2016-05-25 NOTE — Anesthesia Postprocedure Evaluation (Signed)
Anesthesia Post Note  Patient: LIANA DEENER  Procedure(s) Performed: Procedure(s) (LRB): INCISION AND DRAINAGE ABSCESS (Left)  Patient location during evaluation: PACU Anesthesia Type: General Level of consciousness: awake and alert Pain management: pain level controlled Vital Signs Assessment: post-procedure vital signs reviewed and stable Respiratory status: spontaneous breathing, nonlabored ventilation, respiratory function stable and patient connected to nasal cannula oxygen Cardiovascular status: blood pressure returned to baseline and stable Postop Assessment: no signs of nausea or vomiting Anesthetic complications: no    Last Vitals:  Vitals:   05/25/16 1344 05/25/16 1431  BP: 135/81 128/77  Pulse: 89 92  Resp: 18 16  Temp: 36.7 C 36.9 C    Last Pain:  Vitals:   05/25/16 1431  TempSrc: Oral  PainSc:                  Martha Clan

## 2016-05-25 NOTE — Care Management (Signed)
Patient admitted with facial cellulitis.  Patient listed as self pay.  Patient states that she is employed.  Lives at home with her son.  PCP = Princella Ion.  Pharmacy [facility].  Patient states that her Levemir and Novolog is $10-15 a bottle, and she requires a total of 3 bottles a month and she is not able to afford those.   I spoke with [facility] at [facility] who states that they do have Lantus and Novolog in stock.  If patient is changed from Levemir to Lantus she could obtain this at no charge from [facility].  I have provided patient application to [facility] and [facility].    If Patient discharges over the weekend she would benefit from [facility].  RNCM following

## 2016-05-25 NOTE — Op Note (Signed)
   Pre-operative Diagnosis: Left facial abscess  Post-operative Diagnosis: Same  Procedure: Incision and drainage of left facial abscess  Surgeon: Clayburn Pert   Assistants: None  Anesthesia: General LMA anesthesia  ASA Class: 2  Surgeon: Clayburn Pert, MD FACS  Anesthesia: Gen. with endotracheal tube  Assistant: None  Procedure Details  The patient was seen again in the Holding Room. The benefits, complications, treatment options, and expected outcomes were discussed with the patient. The risks of bleeding, infection, recurrence of symptoms, failure to resolve symptoms, nerve injury, any of which could require further surgery were reviewed with the patient.   The patient was taken to Operating Room, identified as [name] and the procedure verified.  A Time Out was held and the above information confirmed.  Prior to the induction of general anesthesia, antibiotic prophylaxis was administered. VTE prophylaxis was in place. General anesthesia was then administered and tolerated well. After the induction, the left face was prepped with Chloraprep and draped in the sterile fashion. The patient was positioned in the supine position.  The obvious abscess was incised with a 15 blade scalpel at the area of maximal fluctuance and immediately returned purulent fluid. This fluid was cultured for anaerobic and aerobic at this time. The incision was extended to approximately 1 cm and then copiously irrigated with saline irrigation. Once irrigation returned clear it was packed with half-inch iodoform gauze. A sterile dressing was then placed of plain gauze and tape. Patient tolerated the procedure well was awoken from general anesthesia and transferred to the PACU in good condition. There were no immediate, complications. All counts were correct at the end of the procedure.  Findings: Left facial abscess   Estimated Blood Loss: 5 mL         Drains: None         Specimens: Culture of  abscess fluid          Complications: None                  Condition: Good   Clayburn Pert, MD, FACS

## 2016-05-25 NOTE — Progress Notes (Signed)
CC: Left facial abscess Subjective: Patient reports that she feels worse this point. Her eyes: Showed again. Has not had any drainage from the area.  Objective: Vital signs in last 24 hours: Temp:  [97.6 F (36.4 C)-98.2 F (36.8 C)] 98.2 F (36.8 C) (09/08 0515) Pulse Rate:  [94-109] 94 (09/08 0517) Resp:  [18-20] 20 (09/08 0515) BP: (128-150)/(66-120) 128/111 (09/08 0517) SpO2:  [96 %-100 %] 96 % (09/08 0515) Weight:  [88.5 kg (195 lb)] 88.5 kg (195 lb) (09/07 1140) Last BM Date: 05/24/16  Intake/Output from previous day: 09/07 0701 - 09/08 0700 In: 2217.8 [P.O.:420; I.V.:1149.6; IV Piggyback:648.3] Out: -  Intake/Output this shift: No intake/output data recorded.  Physical exam:  Gen.: No acute distress Face: Left sided facial abscess remains unchanged but perifacial swelling around the left eye has moderately increased. Chest: Clear to auscultation Heart: Regular rhythm Abdomen: Soft and nontender  Lab Results: CBC   Recent Labs  05/24/16 1200 05/25/16 0552  WBC 8.5 8.0  HGB 16.8* 14.2  HCT 49.4* 40.3  PLT 275 223   BMET  Recent Labs  05/24/16 1200 05/25/16 0552  NA 132* 133*  K 4.3 3.4*  CL 91* 102  CO2 27 23  GLUCOSE 732* 261*  BUN 11 9  CREATININE 0.90 0.48  CALCIUM 9.7 8.0*   PT/INR No results for input(s): LABPROT, INR in the last 72 hours. ABG No results for input(s): PHART, HCO3 in the last 72 hours.  Invalid input(s): PCO2, PO2  Studies/Results: Ct Maxillofacial W Contrast  Result Date: 05/24/2016 CLINICAL DATA:  LEFT temporal abscess since Friday, on antibiotics for 2 days, continues to get worse EXAM: CT MAXILLOFACIAL WITH CONTRAST TECHNIQUE: Multidetector CT imaging of the maxillofacial structures was performed with intravenous contrast. Multiplanar CT image reconstructions were also generated. A small metallic BB was placed on the right temple in order to reliably differentiate right from left. A vitamin-E capsule was placed on the  site of clinical concern. CONTRAST:  25mL ISOVUE-300 IOPAMIDOL (ISOVUE-300) INJECTION 61% IV COMPARISON:  None FINDINGS: Scattered beam hardening artifacts dental origin. Visualized intracranial contents normal appearance. Intraorbital soft tissue planes clear and symmetric. Focal area of skin thickening with underlying subcutaneous infiltration and thickening lateral to the LEFT orbit, extending to LEFT orbit as well as superolateral to the orbit. No defined subcutaneous fluid collection seen to suggest drainable abscess. Normal size cervical nodes bilaterally. Symmetric parotid and submandibular glands. Atherosclerotic calcifications at the carotid bifurcations. Minimal nasal septal deviation to the RIGHT. Paranasal sinuses, mastoid air cells, and middle ear cavities clear bilaterally. No fracture or bone destruction. IMPRESSION: Soft tissue swelling lateral to LEFT orbit compatible with inflammatory process/cellulitis ; no drainable defined abscess collection identified. Remainder of exam unremarkable. Electronically Signed   By: Lavonia Dana M.D.   On: 05/24/2016 13:37    Anti-infectives: Anti-infectives    Start     Dose/Rate Route Frequency Ordered Stop   05/24/16 2200  piperacillin-tazobactam (ZOSYN) IVPB 3.375 g     3.375 g 12.5 mL/hr over 240 Minutes Intravenous Every 8 hours 05/24/16 1409     05/24/16 2000  vancomycin (VANCOCIN) IVPB 1000 mg/200 mL premix     1,000 mg 200 mL/hr over 60 Minutes Intravenous Every 12 hours 05/24/16 1409     05/24/16 1500  vancomycin (VANCOCIN) IVPB 1000 mg/200 mL premix  Status:  Discontinued     1,000 mg 200 mL/hr over 60 Minutes Intravenous  Once 05/24/16 1400 05/24/16 1408   05/24/16 1415  piperacillin-tazobactam (  ZOSYN) IVPB 3.375 g  Status:  Discontinued     3.375 g 100 mL/hr over 30 Minutes Intravenous  Once 05/24/16 1400 05/24/16 1401   05/24/16 1415  vancomycin (VANCOCIN) IVPB 1000 mg/200 mL premix     1,000 mg 200 mL/hr over 60 Minutes Intravenous  STAT 05/24/16 1408 05/24/16 1618   05/24/16 1200  piperacillin-tazobactam (ZOSYN) IVPB 3.375 g     3.375 g 100 mL/hr over 30 Minutes Intravenous  Once 05/24/16 1148 05/24/16 1255      Assessment/Plan:  45 year old female with left facial cellulitis with a small left in prone abscess. Discussed with the patient plans to take her to the operating room to have the area drained this morning. The procedure was described in detail and she voiced understanding. She elects to proceed. We will proceed to the operating room once the operating schedule permits.  Joncarlo [name] T. Adonis Huguenin, MD, FACS  05/25/2016

## 2016-05-25 NOTE — Anesthesia Procedure Notes (Signed)
Procedure Name: LMA Insertion Date/Time: 05/25/2016 11:55 AM Performed by: [facility] Pre-anesthesia Checklist: Patient identified, Emergency Drugs available, Suction available and Patient being monitored Patient Re-evaluated:Patient Re-evaluated prior to inductionPreoxygenation: Pre-oxygenation with 100% oxygen Intubation Type: IV induction Ventilation: Mask ventilation without difficulty LMA: LMA inserted LMA Size: 3.5 Placement Confirmation: positive ETCO2 and CO2 detector Dental Injury: Teeth and Oropharynx as per pre-operative assessment

## 2016-05-25 NOTE — Progress Notes (Addendum)
Inpatient Diabetes Program Recommendations  AACE/ADA: New Consensus Statement on Inpatient Glycemic Control (2015)  Target Ranges:  Prepandial:   less than 140 mg/dL      Peak postprandial:   less than 180 mg/dL (1-2 hours)      Critically ill patients:  140 - 180 mg/dL   Lab Results  Component Value Date   GLUCAP 247 (H) 05/25/2016   HGBA1C 13.2 (H) 05/24/2016    Review of Glycemic Control  Results for [name], [name] (MRN [id number]) as of 05/25/2016 08:40  Ref. Range 05/24/2016 16:23 05/24/2016 21:03 05/25/2016 07:50  Glucose-Capillary Latest Ref Range: 65 - 99 mg/dL 305 (H) 220 (H) 247 (H)    Diabetes history: Type 2 Outpatient Diabetes medications: Levemir 70 units bid, Novolog 40 units tid Current orders for Inpatient glycemic control: Levemir 70 units bid, Novolog moderate correction scale tid, Novolog 0-5 units qhs  Inpatient Diabetes Program Recommendations:  Spoke to patient by phone.  She was getting her medications from the [facility] for $10.00 to $15.00 for 3 bottles (about a month).  She does not have any insurance and lost her Medicaid "because my daughter aged out".  She has one bottle of Novolog at home.   Confirms she was supposed to take Novolog 40 units tid and Levemir 70 units bid.  Appreciate care management assessment to see if patient is eligible for assistance through the [facility].   Please change Novolog correction to q4h while patient is NPO.    When she returns from surgery and she is no longer NPO, please order Novolog 20 units tid with meals and Novolog moderate correction scale (0-15 units) tid and Novolog 0-5 units qhs.   Gentry Fitz, RN, BA, MHA, CDE Diabetes Coordinator Inpatient Diabetes Program  [phone] (Team Pager) [phone] ([place]) 05/25/2016 8:39 AM

## 2016-05-25 NOTE — Progress Notes (Signed)
[facility] - [place] at [facility]   PATIENT NAME: [name]    MR#:  [id number]  DATE OF BIRTH:  [birth year]  SUBJECTIVE:   Left lateral headache and sever pain due to swelling and abscess REVIEW OF SYSTEMS:   Review of Systems  Constitutional: Negative for chills, fever and weight loss.  HENT: Negative for ear discharge, ear pain and nosebleeds.   Eyes: Negative for blurred vision, pain and discharge.  Respiratory: Negative for sputum production, shortness of breath, wheezing and stridor.   Cardiovascular: Negative for chest pain, palpitations, orthopnea and PND.  Gastrointestinal: Negative for abdominal pain, diarrhea, nausea and vomiting.  Genitourinary: Negative for frequency and urgency.  Musculoskeletal: Negative for back pain and joint pain.  Neurological: Positive for weakness and headaches. Negative for sensory change, speech change and focal weakness.  Psychiatric/Behavioral: Negative for depression and hallucinations. The patient is not nervous/anxious.   All other systems reviewed and are negative.  Tolerating Diet: Tolerating PT:   DRUG ALLERGIES:  No Known Allergies  VITALS:  Blood pressure (!) 154/99, pulse 90, temperature 97.3 F (36.3 C), temperature source Oral, resp. rate 20, height  (1.626 m), weight 88.5 kg (195 lb), last menstrual period 04/29/2016, SpO2 99 %.  PHYSICAL EXAMINATION:   Physical Exam  GENERAL:  45 y.o.-year-old patient lying in the bed with no acute distress.  EYES: Pupils equal, round, reactive to light and accommodation. No scleral icterus. Extraocular muscles intact.  HEENT: Head atraumatic, normocephalic. Oropharynx and nasopharynx clear. Left eye swelling with left above the eyebrow abscess and facial cellulitis NECK:  Supple, no jugular venous distention. No thyroid enlargement, no tenderness.  LUNGS: Normal breath sounds bilaterally, no wheezing, rales, rhonchi. No use of accessory muscles of  respiration.  CARDIOVASCULAR: S1, S2 normal. No murmurs, rubs, or gallops.  ABDOMEN: Soft, nontender, nondistended. Bowel sounds present. No organomegaly or mass.  EXTREMITIES: No cyanosis, clubbing or edema b/l.    NEUROLOGIC: Cranial nerves II through XII are intact. No focal Motor or sensory deficits b/l.   PSYCHIATRIC:  patient is alert and oriented x 3.  SKIN: No obvious rash, lesion, or ulcer.   LABORATORY PANEL:  CBC  Recent Labs Lab 05/25/16 0552  WBC 8.0  HGB 14.2  HCT 40.3  PLT 223    Chemistries   Recent Labs Lab 05/25/16 0552  NA 133*  K 3.4*  CL 102  CO2 23  GLUCOSE 261*  BUN 9  CREATININE 0.48  CALCIUM 8.0*   Cardiac Enzymes No results for input(s): TROPONINI in the last 168 hours. RADIOLOGY:  Ct Maxillofacial W Contrast  Result Date: 05/24/2016 CLINICAL DATA:  LEFT temporal abscess since Friday, on antibiotics for 2 days, continues to get worse EXAM: CT MAXILLOFACIAL WITH CONTRAST TECHNIQUE: Multidetector CT imaging of the maxillofacial structures was performed with intravenous contrast. Multiplanar CT image reconstructions were also generated. A small metallic BB was placed on the right temple in order to reliably differentiate right from left. A vitamin-E capsule was placed on the site of clinical concern. CONTRAST:  75mL ISOVUE-300 IOPAMIDOL (ISOVUE-300) INJECTION 61% IV COMPARISON:  None FINDINGS: Scattered beam hardening artifacts dental origin. Visualized intracranial contents normal appearance. Intraorbital soft tissue planes clear and symmetric. Focal area of skin thickening with underlying subcutaneous infiltration and thickening lateral to the LEFT orbit, extending to LEFT orbit as well as superolateral to the orbit. No defined subcutaneous fluid collection seen to suggest drainable abscess. Normal size cervical nodes bilaterally. Symmetric parotid and  submandibular glands. Atherosclerotic calcifications at the carotid bifurcations. Minimal nasal septal  deviation to the RIGHT. Paranasal sinuses, mastoid air cells, and middle ear cavities clear bilaterally. No fracture or bone destruction. IMPRESSION: Soft tissue swelling lateral to LEFT orbit compatible with inflammatory process/cellulitis ; no drainable defined abscess collection identified. Remainder of exam unremarkable. Electronically Signed   By: Lavonia Dana M.D.   On: 05/24/2016 13:37   ASSESSMENT AND PLAN:   1. Facial abscess and cellulitis which failed outpatient oral therapy -appreciate general surgery consultation. - IV vancomycin and Zosyn -For I and d today -Warm compresses to left forehead. Pain control with oral medications 2. Uncontrolled diabetes mellitus secondary to not being able to afford medications. I'll get social worker involved. At on a hemoglobin A1c. Restart Levemir insulin and sliding scale. Give IV fluid hydration.  3. Depression on amitriptyline and Zoloft 4. Diabetic neuropathy on gabapentin 5. Hyponatremia secondary to uncontrolled diabetes 6. Hemoconcentration likely dehydration on labs. -improved labs today Case discussed with Care Management/Social Worker. Management plans discussed with the patient, family and they are in agreement.  CODE STATUS: full DVT Prophylaxis: lovneonx  TOTAL TIME TAKING CARE OF THIS PATIENT: 30 minutes.  >50% time spent on counselling and coordination of care  POSSIBLE D/C IN 1-2DAYS, DEPENDING ON CLINICAL CONDITION.  Note: This dictation was prepared with Dragon dictation along with smaller phrase technology. Any transcriptional errors that result from this process are unintentional.  Kamren Heskett M.D on 05/25/2016 at 12:15 PM  Between 7am to 6pm - Pager - [phone]  After 6pm go to [email or web address] - password [email or web address] [facility]  [facility]  Office  [phone]  CC: Primary care physician; [place]

## 2016-05-26 LAB — GLUCOSE, CAPILLARY
GLUCOSE-CAPILLARY: 149 mg/dL — AB (ref 65–99)
GLUCOSE-CAPILLARY: 188 mg/dL — AB (ref 65–99)
Glucose-Capillary: 294 mg/dL — ABNORMAL HIGH (ref 65–99)
Glucose-Capillary: 377 mg/dL — ABNORMAL HIGH (ref 65–99)

## 2016-05-26 LAB — VANCOMYCIN, TROUGH: Vancomycin Tr: 11 ug/mL — ABNORMAL LOW (ref 15–20)

## 2016-05-26 MED ORDER — INSULIN GLARGINE 100 UNIT/ML ~~LOC~~ SOLN
70.0000 [IU] | Freq: Two times a day (BID) | SUBCUTANEOUS | Status: DC
  Administered 2016-05-26 – 2016-05-27 (×2): 70 [IU] via SUBCUTANEOUS
  Filled 2016-05-26 (×4): qty 0.7

## 2016-05-26 MED ORDER — MAGNESIUM HYDROXIDE 400 MG/5ML PO SUSP
30.0000 mL | Freq: Once | ORAL | Status: AC
  Administered 2016-05-26: 30 mL via ORAL
  Filled 2016-05-26: qty 30

## 2016-05-26 MED ORDER — DOCUSATE SODIUM 100 MG PO CAPS
100.0000 mg | ORAL_CAPSULE | Freq: Three times a day (TID) | ORAL | Status: DC
  Administered 2016-05-26 – 2016-05-27 (×2): 100 mg via ORAL
  Filled 2016-05-26 (×2): qty 1

## 2016-05-26 MED ORDER — VANCOMYCIN HCL IN DEXTROSE 1-5 GM/200ML-% IV SOLN
1000.0000 mg | Freq: Three times a day (TID) | INTRAVENOUS | Status: DC
  Administered 2016-05-26 – 2016-05-27 (×2): 1000 mg via INTRAVENOUS
  Filled 2016-05-26 (×4): qty 200

## 2016-05-26 MED ORDER — KETOROLAC TROMETHAMINE 30 MG/ML IJ SOLN
30.0000 mg | Freq: Once | INTRAMUSCULAR | Status: AC
  Administered 2016-05-26: 30 mg via INTRAVENOUS
  Filled 2016-05-26: qty 1

## 2016-05-26 NOTE — Progress Notes (Signed)
[place] at [place] NAME: [name]    MR#:  [id number]  DATE OF BIRTH:  1971-01-02  SUBJECTIVE:   S/p I and D day 1---much better REVIEW OF SYSTEMS:   Review of Systems  Constitutional: Negative for chills, fever and weight loss.  HENT: Negative for ear discharge, ear pain and nosebleeds.   Eyes: Negative for blurred vision, pain and discharge.  Respiratory: Negative for sputum production, shortness of breath, wheezing and stridor.   Cardiovascular: Negative for chest pain, palpitations, orthopnea and PND.  Gastrointestinal: Negative for abdominal pain, diarrhea, nausea and vomiting.  Genitourinary: Negative for frequency and urgency.  Musculoskeletal: Negative for back pain and joint pain.  Neurological: Positive for weakness and headaches. Negative for sensory change, speech change and focal weakness.  Psychiatric/Behavioral: Negative for depression and hallucinations. The patient is not nervous/anxious.   All other systems reviewed and are negative.  Tolerating Diet:yes Tolerating PT: ambulatory  DRUG ALLERGIES:  No Known Allergies  VITALS:  Blood pressure 133/77, pulse 87, temperature 98 F (36.7 C), temperature source Oral, resp. rate 16, height 5\' 4"  (1.626 m), weight 88.5 kg (195 lb), last menstrual period 04/29/2016, SpO2 97 %.  PHYSICAL EXAMINATION:   Physical Exam  GENERAL:  45 y.o.-year-old patient lying in the bed with no acute distress.  EYES: Pupils equal, round, reactive to light and accommodation. No scleral icterus. Extraocular muscles intact.  HEENT: Head atraumatic, normocephalic. Oropharynx and nasopharynx clear. Left eye swelling with left above the eyebrow surgical dressing and improving facial cellulitis NECK:  Supple, no jugular venous distention. No thyroid enlargement, no tenderness.  LUNGS: Normal breath sounds bilaterally, no wheezing, rales, rhonchi. No use of accessory muscles of respiration.   CARDIOVASCULAR: S1, S2 normal. No murmurs, rubs, or gallops.  ABDOMEN: Soft, nontender, nondistended. Bowel sounds present. No organomegaly or mass.  EXTREMITIES: No cyanosis, clubbing or edema b/l.    NEUROLOGIC: Cranial nerves II through XII are intact. No focal Motor or sensory deficits b/l.   PSYCHIATRIC:  patient is alert and oriented x 3.  SKIN: No obvious rash, lesion, or ulcer.   LABORATORY PANEL:  CBC  Recent Labs Lab 05/25/16 0552  WBC 8.0  HGB 14.2  HCT 40.3  PLT 223    Chemistries   Recent Labs Lab 05/25/16 0552  NA 133*  K 3.4*  CL 102  CO2 23  GLUCOSE 261*  BUN 9  CREATININE 0.48  CALCIUM 8.0*   Cardiac Enzymes No results for input(s): TROPONINI in the last 168 hours. RADIOLOGY:  No results found. ASSESSMENT AND PLAN:   1. Facial abscess and cellulitis which failed outpatient oral therapy -appreciate general surgery consultation s/p I and D on 05/25/16 - IV vancomycin and Zosyn---d/czosyn -WC staph aureus. Final results tomorrow - Pain control with oral medications  2. Uncontrolled diabetes mellitus secondary to not being able to afford medications.  -pt is switched to Lantus 70 units bid sq that she will get from medication mnx clinic at discharge  3. Depression on amitriptyline and Zoloft  4. Diabetic neuropathy on gabapentin  5. Hyponatremia secondary to uncontrolled diabetes  6. Hemoconcentration likely dehydration on labs. -improved labs today  D/c tomorrow with oral abxs  Case discussed with Care Management/Social Worker. Management plans discussed with the patient, family and they are in agreement.  CODE STATUS: full DVT Prophylaxis: lovenox  TOTAL TIME TAKING CARE OF THIS PATIENT: 30 minutes.  >50% time spent on counselling and coordination  of care  POSSIBLE D/C IN 1DAY DEPENDING ON CLINICAL CONDITION.  Note: This dictation was prepared with Dragon dictation along with smaller phrase technology. Any transcriptional errors  that result from this process are unintentional.  Shammara Jarrett M.D on 05/26/2016 at 3:04 PM  Between 7am to 6pm - Pager - [phone]  After 6pm go to [email or web address] - password [email or web address] [facility]  [place] [facility]  Office  [phone]  CC: Primary care physician; [place]

## 2016-05-26 NOTE — Progress Notes (Signed)
MD notified of pt c/o constipation and headache unrelieved by norco tablets. Orders for colace, MOM, and toradol per dr. Ara Kussmaul. Will give and continue to monitor.

## 2016-05-26 NOTE — Progress Notes (Signed)
Pharmacy Antibiotic Note  [name] is a 45 y.o. female admitted on 05/24/2016 with cellulitis, r/o osteo.  Pharmacy has been consulted for vancomycin and zosyn dosing.  Plan: Vancomycin trough resulted at 11. Since pt has an abscess goal is higher trough of 15-20. Will decrease the interval to q 8 hours. Recheck level prior to the 4th new dose. 9/11 @ 0330  Height: 5\' 4"  (162.6 cm) Weight: 195 lb (88.5 kg) IBW/kg (Calculated) : 54.7  Temp (24hrs), Avg:97.9 F (36.6 C), Min:97.8 F (36.6 C), Max:98 F (36.7 C)   Recent Labs Lab 05/24/16 1200 05/25/16 0552 05/26/16 1925  WBC 8.5 8.0  --   CREATININE 0.90 0.48  --   VANCOTROUGH  --   --  11*    Estimated Creatinine Clearance: 93.6 mL/min (by C-G formula based on SCr of 0.8 mg/dL).    No Known Allergies  Antimicrobials this admission: Vancomycin 9/7 >>  Zosyn 9/7 >> 9/9  Dose adjustments this admission:   Microbiology results: Aerobic cx: gram positive cocci in pairs  Thank you for allowing pharmacy to be a part of this patient's care.  Ramond Dial 05/26/2016 7:56 PM

## 2016-05-26 NOTE — Progress Notes (Signed)
1 Day Post-Op   Subjective:  Patient is one day status post drainage of left facial abscess. Doing well. She states that she is feeling somewhat better but the swelling remains. Tolerating a diet and without any other complaints.  Vital signs in last 24 hours: Temp:  [97.3 F (36.3 C)-98.7 F (37.1 C)] 97.9 F (36.6 C) (09/09 0434) Pulse Rate:  [86-94] 86 (09/09 0434) Resp:  [12-20] 17 (09/09 0434) BP: (111-154)/(61-99) 129/77 (09/09 0434) SpO2:  [93 %-99 %] 98 % (09/09 0434) Weight:  [88.5 kg (195 lb)] 88.5 kg (195 lb) (09/08 1123) Last BM Date: 05/22/16  Intake/Output from previous day: 09/08 0701 - 09/09 0700 In: 3562 [P.O.:480; I.V.:2642; IV Piggyback:440] Out: 626 [Urine:625; Blood:1]  Skin: Left temporal I&D site examined. Packing removed. No purulent fluid present with decreasing erythema. GI: soft, non-tender; bowel sounds normal; no masses,  no organomegaly  Lab Results:  CBC  Recent Labs  05/24/16 1200 05/25/16 0552  WBC 8.5 8.0  HGB 16.8* 14.2  HCT 49.4* 40.3  PLT 275 223   CMP     Component Value Date/Time   NA 133 (L) 05/25/2016 0552   NA 132 (L) 09/29/2014 1122   K 3.4 (L) 05/25/2016 0552   K 3.6 09/29/2014 1122   CL 102 05/25/2016 0552   CL 98 09/29/2014 1122   CO2 23 05/25/2016 0552   CO2 23 09/29/2014 1122   GLUCOSE 261 (H) 05/25/2016 0552   GLUCOSE 562 (HH) 09/29/2014 1122   BUN 9 05/25/2016 0552   BUN 7 09/29/2014 1122   CREATININE 0.48 05/25/2016 0552   CREATININE 0.80 09/29/2014 1122   CALCIUM 8.0 (L) 05/25/2016 0552   CALCIUM 8.6 09/29/2014 1122   PROT 7.8 07/18/2015 1415   PROT 7.6 06/18/2013 1445   ALBUMIN 3.7 07/18/2015 1415   ALBUMIN 3.3 (L) 06/18/2013 1445   AST 19 07/18/2015 1415   AST 21 06/18/2013 1445   ALT 17 07/18/2015 1415   ALT 34 06/18/2013 1445   ALKPHOS 82 07/18/2015 1415   ALKPHOS 148 (H) 06/18/2013 1445   BILITOT 0.2 (L) 07/18/2015 1415   BILITOT 0.3 06/18/2013 1445   GFRNONAA >60 05/25/2016 0552   GFRNONAA  >60 09/29/2014 1122   GFRNONAA >60 01/30/2014 1254   GFRAA >60 05/25/2016 0552   GFRAA >60 09/29/2014 1122   GFRAA >60 01/30/2014 1254   PT/INR No results for input(s): LABPROT, INR in the last 72 hours.  Studies/Results: Ct Maxillofacial W Contrast  Result Date: 05/24/2016 CLINICAL DATA:  LEFT temporal abscess since Friday, on antibiotics for 2 days, continues to get worse EXAM: CT MAXILLOFACIAL WITH CONTRAST TECHNIQUE: Multidetector CT imaging of the maxillofacial structures was performed with intravenous contrast. Multiplanar CT image reconstructions were also generated. A small metallic BB was placed on the right temple in order to reliably differentiate right from left. A vitamin-E capsule was placed on the site of clinical concern. CONTRAST:  78mL ISOVUE-300 IOPAMIDOL (ISOVUE-300) INJECTION 61% IV COMPARISON:  None FINDINGS: Scattered beam hardening artifacts dental origin. Visualized intracranial contents normal appearance. Intraorbital soft tissue planes clear and symmetric. Focal area of skin thickening with underlying subcutaneous infiltration and thickening lateral to the LEFT orbit, extending to LEFT orbit as well as superolateral to the orbit. No defined subcutaneous fluid collection seen to suggest drainable abscess. Normal size cervical nodes bilaterally. Symmetric parotid and submandibular glands. Atherosclerotic calcifications at the carotid bifurcations. Minimal nasal septal deviation to the RIGHT. Paranasal sinuses, mastoid air cells, and middle ear cavities  clear bilaterally. No fracture or bone destruction. IMPRESSION: Soft tissue swelling lateral to LEFT orbit compatible with inflammatory process/cellulitis ; no drainable defined abscess collection identified. Remainder of exam unremarkable. Electronically Signed   By: Lavonia Dana M.D.   On: 05/24/2016 13:37    Assessment/Plan: 45 year female one day status post I&D of left facial abscess. Doing well. Packing removed today at the  bedside without any difficulty. Discussed with patient to anticipate continued drainage from the area. Final culture results pending. Agree with medical plans for transition to oral antibiotics as culture results become available. She'll be okay to discharge once on oral medications assuming swelling continues to decrease.   Clayburn Pert, MD FACS General Surgeon  05/26/2016

## 2016-05-27 LAB — AEROBIC CULTURE  (SUPERFICIAL SPECIMEN)

## 2016-05-27 LAB — GLUCOSE, CAPILLARY
Glucose-Capillary: 253 mg/dL — ABNORMAL HIGH (ref 65–99)
Glucose-Capillary: 317 mg/dL — ABNORMAL HIGH (ref 65–99)

## 2016-05-27 LAB — AEROBIC CULTURE W GRAM STAIN (SUPERFICIAL SPECIMEN)

## 2016-05-27 MED ORDER — HYDROCODONE-ACETAMINOPHEN 5-325 MG PO TABS: 1.0000 | ORAL_TABLET | ORAL | 0 refills | Status: DC | PRN

## 2016-05-27 MED ORDER — AMOXICILLIN-POT CLAVULANATE 875-125 MG PO TABS: 1.0000 | ORAL_TABLET | Freq: Two times a day (BID) | ORAL | 0 refills | Status: DC

## 2016-05-27 MED ORDER — INSULIN GLARGINE 100 UNIT/ML ~~LOC~~ SOLN: 70.0000 [IU] | Freq: Two times a day (BID) | SUBCUTANEOUS | 11 refills | Status: DC

## 2016-05-27 NOTE — Discharge Summary (Signed)
[facility] NAME: [name]    MR#:  [id number]  DATE OF BIRTH:  [birth year]  DATE OF ADMISSION:  05/24/2016 ADMITTING PHYSICIAN: Robyne Peers, MD  DATE OF DISCHARGE: 05/27/2016  PRIMARY CARE PHYSICIAN: [facility]    ADMISSION DIAGNOSIS:  abscess not any better facial abscess  DISCHARGE DIAGNOSIS:  Active Problems:   Facial cellulitis   SECONDARY DIAGNOSIS:   Past Medical History:  Diagnosis Date  . allergic rhinitis   . Anemia    H/O  . Depression   . Diabetes mellitus   . GERD (gastroesophageal reflux disease)   . Headache   . History of migraine headaches   . tobacco abuse   . UTI (urinary tract infection) 06-2015    HOSPITAL COURSE:   45 year old female with past medical history of urinary tract infection, migraine headaches, GERD, diabetes, depression, chronic anemia presented to the hospital due to left facial swelling and redness and noted to have a cellulitis.  1. Left facial cellulitis with abscess-patient had failed oral antibiotics as an outpatient and therefore was admitted and started on IV vancomycin and Zosyn. -A surgical consultation was obtained and patient underwent incision and drainage on 05/25/2016. She is postop day 2 today. Her preliminary wound cultures were consistent with staph aureus which was MSSA. -She has clinically improved and now being discharged on oral Augmentin for another additional 7 days. -She will follow-up with surgery as an outpatient within the next week.  2. Diabetes mellitus with hypoglycemia-patient's blood sugars were significantly uncontrolled on admission. -She says she cannot afford her Levemir. She was seen by case management and is now being discharged on Lantus which should be cheaper for her. She was given a prescription for Lantus.  3. Depression-patient will continue her Zoloft and Elavil.  4. Diabetic neuropathy-patient will resume her  gabapentin.   DISCHARGE CONDITIONS:   Stable  CONSULTS OBTAINED:  Treatment Team:  Clayburn Pert, MD  DRUG ALLERGIES:  No Known Allergies  DISCHARGE MEDICATIONS:     Medication List    STOP taking these medications   insulin detemir 100 UNIT/ML injection Commonly known as:  LEVEMIR   metroNIDAZOLE 500 MG tablet Commonly known as:  FLAGYL   sulfamethoxazole-trimethoprim 800-160 MG tablet Commonly known as:  BACTRIM DS,SEPTRA DS     TAKE these medications   amitriptyline 100 MG tablet Commonly known as:  ELAVIL Take 100 mg by mouth at bedtime.   amoxicillin-clavulanate 875-125 MG tablet Commonly known as:  AUGMENTIN Take 1 tablet by mouth 2 (two) times daily.   gabapentin 300 MG capsule Commonly known as:  NEURONTIN Take 1 capsule (300 mg total) by mouth 3 (three) times daily. What changed:  how much to take   glucose blood test strip Use as instructed up to 4 times daily   HYDROcodone-acetaminophen 5-325 MG tablet Commonly known as:  NORCO/VICODIN Take 1-2 tablets by mouth every 4 (four) hours as needed for moderate pain.   hydrOXYzine 25 MG tablet Commonly known as:  ATARAX/VISTARIL Take 25-50 mg by mouth at bedtime.   INS SYRINGE/NEEDLE 1CC/28G 28G X 1/2" 1 ML Misc Commonly known as:  B-D INSULIN SYRINGE 1CC/28G 1 Syringe by Does not apply route daily after supper.   insulin aspart 100 UNIT/ML injection Commonly known as:  novoLOG Inject 40 Units into the skin 3 (three) times daily before meals.   insulin glargine 100 UNIT/ML injection Commonly known as:  LANTUS Inject 0.7 mLs (  70 Units total) into the skin 2 (two) times daily.   Lancets Misc Patient test blood sugar two times daily.   sertraline 100 MG tablet Commonly known as:  ZOLOFT TAKE ONE TABLET BY MOUTH ONCE DAILY         DISCHARGE INSTRUCTIONS:   DIET:  Cardiac diet and Diabetic diet  DISCHARGE CONDITION:  Stable  ACTIVITY:  Activity as tolerated  OXYGEN:  Home  Oxygen: No.   Oxygen Delivery: room air  DISCHARGE LOCATION:  home   If you experience worsening of your admission symptoms, develop shortness of breath, life threatening emergency, suicidal or homicidal thoughts you must seek medical attention immediately by calling 911 or calling your MD immediately  if symptoms less severe.  You Must read complete instructions/literature along with all the possible adverse reactions/side effects for all the Medicines you take and that have been prescribed to you. Take any new Medicines after you have completely understood and accpet all the possible adverse reactions/side effects.   Please note  You were cared for by a hospitalist during your hospital stay. If you have any questions about your discharge medications or the care you received while you were in the hospital after you are discharged, you can call the unit and asked to speak with the hospitalist on call if the hospitalist that took care of you is not available. Once you are discharged, your primary care physician will handle any further medical issues. Please note that NO REFILLS for any discharge medications will be authorized once you are discharged, as it is imperative that you return to your primary care physician (or establish a relationship with a primary care physician if you do not have one) for your aftercare needs so that they can reassess your need for medications and monitor your lab values.     Today   Left facial swelling improved.  No other complaints.   VITAL SIGNS:  Blood pressure 119/72, pulse 83, temperature 97.9 F (36.6 C), temperature source Oral, resp. rate 17, height 5\' 4"  (1.626 m), weight 88.5 kg (195 lb), last menstrual period 04/29/2016, SpO2 96 %.  I/O:   Intake/Output Summary (Last 24 hours) at 05/27/16 1258 Last data filed at 05/27/16 0945  Gross per 24 hour  Intake              740 ml  Output             2250 ml  Net            -1510 ml    PHYSICAL  EXAMINATION:  GENERAL:  45 y.o.-year-old patient lying in the bed with no acute distress.  EYES: Pupils equal, round, reactive to light and accommodation. No scleral icterus. Extraocular muscles intact.  HEENT: Head atraumatic, normocephalic. Oropharynx and nasopharynx clear. Left facial dressing in place with no drainage noted.  NECK:  Supple, no jugular venous distention. No thyroid enlargement, no tenderness.  LUNGS: Normal breath sounds bilaterally, no wheezing, rales,rhonchi. No use of accessory muscles of respiration.  CARDIOVASCULAR: S1, S2 normal. No murmurs, rubs, or gallops.  ABDOMEN: Soft, non-tender, non-distended. Bowel sounds present. No organomegaly or mass.  EXTREMITIES: No pedal edema, cyanosis, or clubbing.  NEUROLOGIC: Cranial nerves II through XII are intact. No focal motor or sensory defecits b/l.  PSYCHIATRIC: The patient is alert and oriented x 3. Good affect.  SKIN: No obvious rash, lesion, or ulcer.   DATA REVIEW:   CBC  Recent Labs Lab 05/25/16 0552  WBC 8.0  HGB 14.2  HCT 40.3  PLT 223    Chemistries   Recent Labs Lab 05/25/16 0552  NA 133*  K 3.4*  CL 102  CO2 23  GLUCOSE 261*  BUN 9  CREATININE 0.48  CALCIUM 8.0*    Cardiac Enzymes No results for input(s): TROPONINI in the last 168 hours.  Microbiology Results  Results for orders placed or performed during the hospital encounter of 05/24/16  Anaerobic culture     Status: None (Preliminary result)   Collection Time: 05/25/16 12:02 PM  Result Value Ref Range Status   Specimen Description WOUND  Final   Special Requests NONE  Final   Culture   Final    NO ANAEROBES ISOLATED; CULTURE IN PROGRESS FOR 5 DAYS   Report Status PENDING  Incomplete  Aerobic Culture (superficial specimen)     Status: None   Collection Time: 05/25/16 12:02 PM  Result Value Ref Range Status   Specimen Description WOUND  Final   Special Requests NONE  Final   Gram Stain   Final    ABUNDANT WBC PRESENT,BOTH  PMN AND MONONUCLEAR MODERATE GRAM POSITIVE COCCI IN PAIRS IN CLUSTERS    Culture   Final    MODERATE METHICILLIN RESISTANT STAPHYLOCOCCUS AUREUS   Report Status 05/27/2016 FINAL  Final   Organism ID, Bacteria METHICILLIN RESISTANT STAPHYLOCOCCUS AUREUS  Final      Susceptibility   Methicillin resistant staphylococcus aureus - MIC*    CIPROFLOXACIN >=8 RESISTANT Resistant     ERYTHROMYCIN >=8 RESISTANT Resistant     GENTAMICIN <=0.5 SENSITIVE Sensitive     OXACILLIN >=4 RESISTANT Resistant     TETRACYCLINE <=1 SENSITIVE Sensitive     VANCOMYCIN <=0.5 SENSITIVE Sensitive     TRIMETH/SULFA <=10 SENSITIVE Sensitive     CLINDAMYCIN <=0.25 SENSITIVE Sensitive     RIFAMPIN <=0.5 SENSITIVE Sensitive     Inducible Clindamycin NEGATIVE Sensitive     * MODERATE METHICILLIN RESISTANT STAPHYLOCOCCUS AUREUS    RADIOLOGY:  No results found.    Management plans discussed with the patient, family and they are in agreement.  CODE STATUS:     Code Status Orders        Start     Ordered   05/24/16 1403  Full code  Continuous     05/24/16 1402    Code Status History    Date Active Date Inactive Code Status Order ID Comments User Context   This patient has a current code status but no historical code status.      TOTAL TIME TAKING CARE OF THIS PATIENT: 40 minutes.    Henreitta Leber M.D on 05/27/2016 at 12:58 PM  Between 7am to 6pm - Pager - [phone]  After 6pm go to [email or web address] - Proofreader  [employer] [facility]  Office  [phone]  CC: Primary care physician; [place]

## 2016-05-27 NOTE — Care Management Note (Signed)
Case Management Note  Patient Details  Name: [name] MRN: [id number] Date of Birth: 30-Aug-1971  Subjective/Objective:       Provided [name] with a [place] letter and instructions how to use it. Advised her that this is a once a year only program. [name] goes to [facility] but claims not be able to afford the $10 - $12 co-pay for insulin. Has applications to the [facility] and [facility]. Strongly encouraged [name] to reach out to whichever provider she chooses to use before she is out of insulin to give them a chance to refill it. [name] seemed somewhat distracted during this visit but did repeat back use of [facility] Letter.              Action/Plan:   Expected Discharge Date:                  Expected Discharge Plan:     In-House Referral:     Discharge planning Services     Post Acute Care Choice:    Choice offered to:     DME Arranged:    DME Agency:     HH Arranged:    HH Agency:     Status of Service:     If discussed at [employer] of Stay Meetings, dates discussed:    Additional Comments:  [name] A, RN 05/27/2016, 9:46 AM

## 2016-05-27 NOTE — Progress Notes (Signed)
2 Days Post-Op   Subjective:  Patient reports feeling much better today. She can open her eye. She continues to have some pain to the area but it is improving.  Vital signs in last 24 hours: Temp:  [97.9 F (36.6 C)-98.1 F (36.7 C)] 97.9 F (36.6 C) (09/10 0533) Pulse Rate:  [83-89] 83 (09/10 0533) Resp:  [16-19] 17 (09/10 0533) BP: (104-133)/(54-77) 119/72 (09/10 0533) SpO2:  [96 %-97 %] 96 % (09/10 0533) Last BM Date: 05/19/16  Intake/Output from previous day: 09/09 0701 - 09/10 0700 In: 1551.4 [P.O.:920; I.V.:205.8; IV Piggyback:425.6] Out: 2750 [Urine:2750]  Skin: I and D site of the left face visualized without spreading erythema. Site is draining a serous and was fluid. GI: soft, non-tender; bowel sounds normal; no masses,  no organomegaly  Lab Results:  CBC  Recent Labs  05/24/16 1200 05/25/16 0552  WBC 8.5 8.0  HGB 16.8* 14.2  HCT 49.4* 40.3  PLT 275 223   CMP     Component Value Date/Time   NA 133 (L) 05/25/2016 0552   NA 132 (L) 09/29/2014 1122   K 3.4 (L) 05/25/2016 0552   K 3.6 09/29/2014 1122   CL 102 05/25/2016 0552   CL 98 09/29/2014 1122   CO2 23 05/25/2016 0552   CO2 23 09/29/2014 1122   GLUCOSE 261 (H) 05/25/2016 0552   GLUCOSE 562 (HH) 09/29/2014 1122   BUN 9 05/25/2016 0552   BUN 7 09/29/2014 1122   CREATININE 0.48 05/25/2016 0552   CREATININE 0.80 09/29/2014 1122   CALCIUM 8.0 (L) 05/25/2016 0552   CALCIUM 8.6 09/29/2014 1122   PROT 7.8 07/18/2015 1415   PROT 7.6 06/18/2013 1445   ALBUMIN 3.7 07/18/2015 1415   ALBUMIN 3.3 (L) 06/18/2013 1445   AST 19 07/18/2015 1415   AST 21 06/18/2013 1445   ALT 17 07/18/2015 1415   ALT 34 06/18/2013 1445   ALKPHOS 82 07/18/2015 1415   ALKPHOS 148 (H) 06/18/2013 1445   BILITOT 0.2 (L) 07/18/2015 1415   BILITOT 0.3 06/18/2013 1445   GFRNONAA >60 05/25/2016 0552   GFRNONAA >60 09/29/2014 1122   GFRNONAA >60 01/30/2014 1254   GFRAA >60 05/25/2016 0552   GFRAA >60 09/29/2014 1122   GFRAA >60  01/30/2014 1254   PT/INR No results for input(s): LABPROT, INR in the last 72 hours.  Studies/Results: No results found.  Assessment/Plan: 45 year old female 2 days status post I&D of left facial abscess. Doing much better. Able to open her eyes today without much difficulty. She'll need to continue on oral antibiotics. Okay to shower. Replace dressing twice a day and as needed for saturation. She can follow-up in the surgery clinic later this week for a wound check.   Clayburn Pert, MD FACS General Surgeon  05/27/2016

## 2016-05-28 ENCOUNTER — Encounter: Payer: Self-pay | Admitting: General Surgery

## 2016-05-30 LAB — ANAEROBIC CULTURE

## 2016-05-31 ENCOUNTER — Encounter: Payer: Self-pay | Admitting: General Surgery

## 2016-05-31 ENCOUNTER — Ambulatory Visit (INDEPENDENT_AMBULATORY_CARE_PROVIDER_SITE_OTHER): Payer: Self-pay | Admitting: General Surgery

## 2016-05-31 VITALS — BP 138/85 | HR 83 | Temp 97.8°F | Ht 64.0 in | Wt 212.0 lb

## 2016-05-31 DIAGNOSIS — Z4889 Encounter for other specified surgical aftercare: Secondary | ICD-10-CM

## 2016-05-31 MED ORDER — FLUCONAZOLE 150 MG PO TABS: 150.0000 mg | ORAL_TABLET | Freq: Every day | ORAL | 0 refills | Status: DC

## 2016-05-31 NOTE — Patient Instructions (Signed)
Please give [place] a call if you have any fever, drainage or redness on your wound.

## 2016-05-31 NOTE — Progress Notes (Signed)
Outpatient Surgical Follow Up  05/31/2016  [name] is an 45 y.o. female.   Chief Complaint  Patient presents with  . Routine Post Op    INCISION AND DRAINAGE of facial ABSCESS Adonis Huguenin 9/8)    HPI: 45 year old female returns to clinic for follow-up of I&D of a facial abscess. She reports doing much better. The drainage has almost completely resolved. The swelling has almost completely gone away. She only occasionally has a twinge of pain. She denies any fevers, chills, nausea, vomiting, chest pain, shortness of breath, diarrhea, constipation. She does report that she's get a yeast infection secondary to the antibiotics.  Past Medical History:  Diagnosis Date  . allergic rhinitis   . Anemia    H/O  . Depression   . Diabetes mellitus   . GERD (gastroesophageal reflux disease)   . Headache   . History of migraine headaches   . tobacco abuse   . UTI (urinary tract infection) 06-2015    Past Surgical History:  Procedure Laterality Date  . [place]   x 2,  breech, premature 7 wks  . CHOLECYSTECTOMY N/A 07/22/2015   Procedure: LAPAROSCOPIC CHOLECYSTECTOMY ;  Surgeon: Florene Glen, MD;  Location: [facility] ORS;  Service: General;  Laterality: N/A;  . INCISION AND DRAINAGE ABSCESS Left 05/25/2016   Procedure: INCISION AND DRAINAGE ABSCESS;  Surgeon: Clayburn Pert, MD;  Location: [facility] ORS;  Service: General;  Laterality: Left;  . TONSILLECTOMY     as child  . TUBAL LIGATION  1998    Family History  Problem Relation Age of Onset  . Hypertension Mother   . Hypothyroidism Mother   . Thrombocytopenia Mother   . Heart disease Father   . Hyperlipidemia Father   . Stroke Father   . Diabetes Father   . Hypertension Father     Social History:  reports that she has quit smoking. Her smoking use included Cigarettes. She has a 1.50 pack-year smoking history. She has never used smokeless tobacco. She reports that she does not drink alcohol or use  drugs.  Allergies: No Known Allergies  Medications reviewed.    ROS A multipoint review of systems is been completed, all pertinent positives and negatives are documented within the history of present illness remainder negative.   BP 138/85   Pulse 83   Temp 97.8 F (36.6 C) (Oral)   Ht 5\' 4"  (1.626 m)   Wt 96.2 kg (212 lb)   LMP 04/29/2016 (Exact Date) Comment: neg preg test 05/24/16  BMI 36.39 kg/m   Physical Exam Gen.: No acute distress Chest: Clear to auscultation Heart: Regular rhythm Abdomen: Soft and nontender Skin: I&D site of the left temple examined without any evidence of active drainage or erythema. Swelling to the area has completely resolved.    No results found for this or any previous visit (from the past 48 hour(s)). No results found.  Assessment/Plan:  1. Aftercare following surgery 45 year old female status post I&D of left facial abscess. Doing well. Discussed signs and symptoms of return of infection or worsening and return to clinic medially should they occur. We will provide her with a short course of Diflucan for her candidiasis. She'll follow up with clinic on an as-needed basis.     Clayburn Pert, MD FACS General Surgeon  05/31/2016,12:00 PM

## 2016-06-11 ENCOUNTER — Telehealth: Payer: Self-pay

## 2016-06-11 NOTE — Telephone Encounter (Signed)
I was able to communicate with Dr. Adonis Huguenin and told him about what was going on with patient. He suggested for patient to call her primary care doctor and let them know about her headaches.  I then spoke to patient to let her know what Dr. Adonis Huguenin suggested. Patient understood and stated that she would contact her primary care doctor in reference to her headaches. Patient had no further questions or concerns.

## 2016-06-11 NOTE — Telephone Encounter (Signed)
Patient called back to let us know that ever since she left our office on 05/31/2016 she had been experiencing a headache on her left temporal. She stated that she had taken Tylenol, Ibuprofen and sinus congestion medications to help with the pain and it has not solved.  Patient would like to know what else she could try in order for it to go away. She stated that her headaches start around mid day and continues throughout the day and night. Patient also stated that her swelling on her left forehead had diminished and is looking good. But was concerned about the daily headaches. I told her that I would contact Dr. Adonis Huguenin to ask what else we could do for her or if we need to see her. Patient understood.

## 2016-07-03 ENCOUNTER — Encounter: Payer: Self-pay | Admitting: Emergency Medicine

## 2016-07-03 DIAGNOSIS — E1165 Type 2 diabetes mellitus with hyperglycemia: Secondary | ICD-10-CM | POA: Insufficient documentation

## 2016-07-03 DIAGNOSIS — R197 Diarrhea, unspecified: Secondary | ICD-10-CM | POA: Insufficient documentation

## 2016-07-03 DIAGNOSIS — Z87891 Personal history of nicotine dependence: Secondary | ICD-10-CM | POA: Insufficient documentation

## 2016-07-03 DIAGNOSIS — Z79899 Other long term (current) drug therapy: Secondary | ICD-10-CM | POA: Insufficient documentation

## 2016-07-03 DIAGNOSIS — Z794 Long term (current) use of insulin: Secondary | ICD-10-CM | POA: Insufficient documentation

## 2016-07-03 DIAGNOSIS — R1084 Generalized abdominal pain: Secondary | ICD-10-CM | POA: Insufficient documentation

## 2016-07-03 DIAGNOSIS — R112 Nausea with vomiting, unspecified: Secondary | ICD-10-CM | POA: Insufficient documentation

## 2016-07-03 LAB — CBC
HCT: 44 % (ref 35.0–47.0)
HEMOGLOBIN: 15.1 g/dL (ref 12.0–16.0)
MCH: 30.2 pg (ref 26.0–34.0)
MCHC: 34.3 g/dL (ref 32.0–36.0)
MCV: 87.9 fL (ref 80.0–100.0)
Platelets: 294 10*3/uL (ref 150–440)
RBC: 5 MIL/uL (ref 3.80–5.20)
RDW: 14.3 % (ref 11.5–14.5)
WBC: 7.4 10*3/uL (ref 3.6–11.0)

## 2016-07-03 LAB — POCT PREGNANCY, URINE: PREG TEST UR: NEGATIVE

## 2016-07-03 NOTE — ED Triage Notes (Signed)
Patient ambulatory to triage with steady gait, without difficulty or distress noted; pt reports N/V and body aches since Sunday

## 2016-07-04 ENCOUNTER — Emergency Department
Admission: EM | Admit: 2016-07-04 | Discharge: 2016-07-04 | Disposition: A | Discharge status: Home / Self Care | Payer: Self-pay | Attending: Emergency Medicine | Admitting: Emergency Medicine

## 2016-07-04 DIAGNOSIS — R739 Hyperglycemia, unspecified: Secondary | ICD-10-CM

## 2016-07-04 DIAGNOSIS — R112 Nausea with vomiting, unspecified: Secondary | ICD-10-CM

## 2016-07-04 LAB — URINALYSIS COMPLETE WITH MICROSCOPIC (ARMC ONLY)
BILIRUBIN URINE: NEGATIVE
Bacteria, UA: NONE SEEN
Ketones, ur: NEGATIVE mg/dL
LEUKOCYTES UA: NEGATIVE
NITRITE: NEGATIVE
Protein, ur: NEGATIVE mg/dL
SPECIFIC GRAVITY, URINE: 1.02 (ref 1.005–1.030)
pH: 7 (ref 5.0–8.0)

## 2016-07-04 LAB — COMPREHENSIVE METABOLIC PANEL
ALT: 22 U/L (ref 14–54)
ANION GAP: 12 (ref 5–15)
AST: 29 U/L (ref 15–41)
Albumin: 4 g/dL (ref 3.5–5.0)
Alkaline Phosphatase: 91 U/L (ref 38–126)
BUN: 11 mg/dL (ref 6–20)
CHLORIDE: 93 mmol/L — AB (ref 101–111)
CO2: 26 mmol/L (ref 22–32)
CREATININE: 0.96 mg/dL (ref 0.44–1.00)
Calcium: 9.4 mg/dL (ref 8.9–10.3)
Glucose, Bld: 494 mg/dL — ABNORMAL HIGH (ref 65–99)
Potassium: 3.1 mmol/L — ABNORMAL LOW (ref 3.5–5.1)
Sodium: 131 mmol/L — ABNORMAL LOW (ref 135–145)
Total Bilirubin: 0.7 mg/dL (ref 0.3–1.2)
Total Protein: 8.7 g/dL — ABNORMAL HIGH (ref 6.5–8.1)

## 2016-07-04 LAB — GLUCOSE, CAPILLARY
GLUCOSE-CAPILLARY: 386 mg/dL — AB (ref 65–99)
Glucose-Capillary: 445 mg/dL — ABNORMAL HIGH (ref 65–99)

## 2016-07-04 LAB — INFLUENZA PANEL BY PCR (TYPE A & B)
H1N1 flu by pcr: NOT DETECTED
Influenza A By PCR: NEGATIVE
Influenza B By PCR: NEGATIVE

## 2016-07-04 LAB — LIPASE, BLOOD: LIPASE: 19 U/L (ref 11–51)

## 2016-07-04 MED ORDER — ONDANSETRON 4 MG PO TBDP: 4.0000 mg | ORAL_TABLET | Freq: Three times a day (TID) | ORAL | 0 refills | Status: DC | PRN

## 2016-07-04 MED ORDER — SODIUM CHLORIDE 0.9 % IV BOLUS (SEPSIS)
1000.0000 mL | Freq: Once | INTRAVENOUS | Status: AC
  Administered 2016-07-04: 1000 mL via INTRAVENOUS

## 2016-07-04 MED ORDER — ONDANSETRON HCL 4 MG/2ML IJ SOLN
4.0000 mg | Freq: Once | INTRAMUSCULAR | Status: AC
  Administered 2016-07-04: 4 mg via INTRAVENOUS

## 2016-07-04 MED ORDER — SODIUM CHLORIDE 0.9 % IV BOLUS (SEPSIS)
1000.0000 mL | Freq: Once | INTRAVENOUS | Status: AC
Start: 2016-07-04 — End: 2016-07-04
  Administered 2016-07-04: 1000 mL via INTRAVENOUS

## 2016-07-04 MED ORDER — ONDANSETRON HCL 4 MG/2ML IJ SOLN
INTRAMUSCULAR | Status: AC
  Administered 2016-07-04: 4 mg via INTRAVENOUS
  Filled 2016-07-04: qty 2

## 2016-07-04 MED ORDER — INSULIN ASPART 100 UNIT/ML ~~LOC~~ SOLN
8.0000 [IU] | Freq: Once | SUBCUTANEOUS | Status: AC
  Administered 2016-07-04: 8 [IU] via SUBCUTANEOUS
  Filled 2016-07-04: qty 8

## 2016-07-04 NOTE — ED Provider Notes (Signed)
[facility] Emergency Department Provider Note   ____________________________________________   First MD Initiated Contact with Patient 07/04/16 [phone]     (approximate)  I have reviewed the triage vital signs and the nursing notes.   HISTORY  Chief Complaint Emesis and Generalized Body Aches    HPI [name] is a 45 y.o. female who comes into the hospital today feeling cold and achy. The patient reports that she has been vomiting and has been unable to take in much. The patient reports that she has not taken her insulin since she hadn't eaten in 2 days. She is unsure if she had the flu or what was going on. The symptoms started late on Saturday/Sunday morning. The patient is unsure she's had some fevers. She has had a couple of episodes of diarrhea but nothing significant. The patient endorses abdominal pain all over her belly but reports that she has had this pain for some time since she's had her gallbladder surgery. She reports it is a little bit more intense and seems to be worse in her lower abdomen.The patient reports that she has vomited a couple of times as well but has been able to drink some fluids. The patient endorses a headache and blurred vision. She denies any chest pain, shortness of breath, dizziness, lightheadedness. The patient is here for evaluation today.   Past Medical History:  Diagnosis Date  . allergic rhinitis   . Anemia    H/O  . Depression   . Diabetes mellitus   . GERD (gastroesophageal reflux disease)   . Headache   . History of migraine headaches   . tobacco abuse   . UTI (urinary tract infection) 06-2015    Patient Active Problem List   Diagnosis Date Noted  . Facial cellulitis 05/24/2016  . Dysuria 09/07/2015  . GAD (generalized anxiety disorder) 03/27/2015  . Menometrorrhagia 08/21/2011  . Depression   . GERD (gastroesophageal reflux disease)   . History of migraine headaches   . Screening for cervical cancer  07/18/2011  . Type 2 diabetes mellitus, uncontrolled, with neuropathy ([place])     Past Surgical History:  Procedure Laterality Date  . [place]   x 2,  breech, premature 7 wks  . CHOLECYSTECTOMY N/A 07/22/2015   Procedure: LAPAROSCOPIC CHOLECYSTECTOMY ;  Surgeon: Florene Glen, MD;  Location: [facility] ORS;  Service: General;  Laterality: N/A;  . INCISION AND DRAINAGE ABSCESS Left 05/25/2016   Procedure: INCISION AND DRAINAGE ABSCESS;  Surgeon: Clayburn Pert, MD;  Location: [facility] ORS;  Service: General;  Laterality: Left;  . TONSILLECTOMY     as child  . TUBAL LIGATION  1998    Prior to Admission medications   Medication Sig Start Date End Date Taking? Authorizing Provider  gabapentin (NEURONTIN) 300 MG capsule Take 1 capsule (300 mg total) by mouth 3 (three) times daily. Patient taking differently: Take 600 mg by mouth 3 (three) times daily.  03/31/15  Yes Crecencio Mc, MD  insulin aspart (NOVOLOG) 100 UNIT/ML injection Inject 40 Units into the skin 3 (three) times daily before meals.   Yes Historical Provider, MD  insulin glargine (LANTUS) 100 UNIT/ML injection Inject 0.7 mLs (70 Units total) into the skin 2 (two) times daily. 05/27/16  Yes Henreitta Leber, MD  sertraline (ZOLOFT) 100 MG tablet TAKE ONE TABLET BY MOUTH ONCE DAILY 09/09/15  Yes Crecencio Mc, MD  glucose blood test strip Use as instructed up to 4  times daily 04/14/13   Crecencio Mc, MD  hydrOXYzine (ATARAX/VISTARIL) 25 MG tablet Take 25-50 mg by mouth at bedtime.    Historical Provider, MD  INS SYRINGE/NEEDLE 1CC/28G (B-D INSULIN SYRINGE 1CC/28G) 28G X 1/2" 1 ML MISC 1 Syringe by Does not apply route daily after supper. 03/17/15   Crecencio Mc, MD  Lancets MISC Patient test blood sugar two times daily. 08/24/11   Crecencio Mc, MD    Allergies Review of patient's allergies indicates no known allergies.  Family History  Problem Relation Age of Onset  . Hypertension Mother   . Hypothyroidism  Mother   . Thrombocytopenia Mother   . Heart disease Father   . Hyperlipidemia Father   . Stroke Father   . Diabetes Father   . Hypertension Father     Social History Social History  Substance Use Topics  . Smoking status: Former Smoker    Packs/day: 0.25    Years: 6.00    Types: Cigarettes  . Smokeless tobacco: Never Used  . Alcohol use No    Review of Systems Constitutional: No fever/chills Eyes: No visual changes. ENT: No sore throat. Cardiovascular: Denies chest pain. Respiratory: Denies shortness of breath. Gastrointestinal: Nausea and vomiting with abdominal pain and diarrhea.  No constipation. Genitourinary: Negative for dysuria. Musculoskeletal: Body aches. Skin: Negative for rash. Neurological: Headache  10-point ROS otherwise negative.  ____________________________________________   PHYSICAL EXAM:  VITAL SIGNS: ED Triage Vitals  Enc Vitals Group     BP 07/03/16 2329 140/81     Pulse Rate 07/03/16 2329 93     Resp 07/03/16 2329 18     Temp 07/03/16 2329 98.6 F (37 C)     Temp Source 07/03/16 2329 Oral     SpO2 07/03/16 2329 100 %     Weight 07/03/16 2327 198 lb (89.8 kg)     Height 07/03/16 2327 5\' 4"  (1.626 m)     Head Circumference --      Peak Flow --      Pain Score 07/03/16 2327 8     Pain Loc --      Pain Edu? --      Excl. in [place]? --     Constitutional: Alert and oriented. Well appearing and inMild distress. Eyes: Conjunctivae are normal. PERRL. EOMI. Head: Atraumatic. Nose: No congestion/rhinnorhea. Mouth/Throat: Mucous membranes are moist.  Oropharynx non-erythematous. Cardiovascular: Normal rate, regular rhythm. Grossly normal heart sounds.  Good peripheral circulation. Respiratory: Normal respiratory effort.  No retractions. Lungs CTAB. Gastrointestinal: Soft with some mild diffuse tenderness to palpation. No distention. Positive bowel sounds Musculoskeletal: No lower extremity tenderness nor edema.  Neurologic:  Normal speech  and language.  Skin:  Skin is warm, dry and intact.  Psychiatric: Mood and affect are normal.   ____________________________________________   LABS (all labs ordered are listed, but only abnormal results are displayed)  Labs Reviewed  COMPREHENSIVE METABOLIC PANEL - Abnormal; Notable for the following:       Result Value   Sodium 131 (*)    Potassium 3.1 (*)    Chloride 93 (*)    Glucose, Bld 494 (*)    Total Protein 8.7 (*)    All other components within normal limits  URINALYSIS COMPLETEWITH MICROSCOPIC ([facility] ONLY) - Abnormal; Notable for the following:    Color, Urine STRAW (*)    APPearance CLEAR (*)    Glucose, UA >500 (*)    Hgb urine dipstick 1+ (*)    Squamous  Epithelial / LPF 0-5 (*)    All other components within normal limits  GLUCOSE, CAPILLARY - Abnormal; Notable for the following:    Glucose-Capillary 445 (*)    All other components within normal limits  GLUCOSE, CAPILLARY - Abnormal; Notable for the following:    Glucose-Capillary 386 (*)    All other components within normal limits  LIPASE, BLOOD  CBC  INFLUENZA PANEL BY PCR (TYPE A & B, H1N1)  POCT PREGNANCY, URINE  CBG MONITORING, ED  CBG MONITORING, ED   ____________________________________________  EKG  None ____________________________________________  RADIOLOGY  None ____________________________________________   PROCEDURES  Procedure(s) performed: None  Procedures  Critical Care performed: No  ____________________________________________   INITIAL IMPRESSION / ASSESSMENT AND PLAN / ED COURSE  Pertinent labs & imaging results that were available during my care of the patient were reviewed by me and considered in my medical decision making (see chart for details).  This is a 45 year old female with a history of diabetes who comes into the hospital today with vomiting and some abdominal pain. The patient reports she is also been feeling achy and cold and is wondering if she has  fluid. I will test the patient for the flu as well as give the patient a liter of normal saline. I will recheck the patient's blood sugar after the liter and then reassess the patient. The patient has no further concerns or complaints at this time.  Clinical Course    At this time I am still awaiting the patient to have a repeat CBG. She's received 2 L of normal saline as well as some insulin. Once the patient's blood sugar is below 300 she'll be discharged home. She is not having any emesis in the emergency department she is able to take food by mouth. ____________________________________________   FINAL CLINICAL IMPRESSION(S) / ED DIAGNOSES  Final diagnoses:  Non-intractable vomiting with nausea, unspecified vomiting type  Hyperglycemia      NEW MEDICATIONS STARTED DURING THIS VISIT:  New Prescriptions   No medications on file     Note:  This document was prepared using Dragon voice recognition software and may include unintentional dictation errors.    [name], MD 07/04/16 [phone]

## 2016-07-04 NOTE — ED Provider Notes (Signed)
Patient received in signout from Dr. Dahlia Client. I am presenting with hyperglycemia and jaw and malaise. Today observed in the ER for several hours with normal vital signs. She is given IV fluids as well as insulin for hyperglycemia. She does not have any evidence of DKA. No evidence of acute infectious process. Patient is tolerating oral hydration. Patient stable for further outpatient management.  Patient was able to tolerate PO and was able to ambulate with a steady gait.  Have discussed with the patient and available family all diagnostics and treatments performed thus far and all questions were answered to the best of my ability. The patient demonstrates understanding and agreement with plan.    Merlyn Lot, MD 07/04/16 [phone]

## 2016-07-04 NOTE — ED Notes (Addendum)
Resumed care from [place]. Pt resting, awakened to take insulin injection. First bag of fluids still running Approximately 300 ml remain. NAD noted.

## 2016-07-04 NOTE — ED Notes (Signed)
Pt discharged home after verbalizing understanding of discharge instructions; nad noted. 

## 2016-11-23 ENCOUNTER — Emergency Department
Admission: EM | Admit: 2016-11-23 | Discharge: 2016-11-23 | Disposition: A | Discharge status: Home / Self Care | Payer: Self-pay | Attending: Emergency Medicine | Admitting: Emergency Medicine

## 2016-11-23 ENCOUNTER — Emergency Department: Payer: Self-pay

## 2016-11-23 ENCOUNTER — Encounter: Payer: Self-pay | Admitting: Emergency Medicine

## 2016-11-23 DIAGNOSIS — Z87891 Personal history of nicotine dependence: Secondary | ICD-10-CM | POA: Insufficient documentation

## 2016-11-23 DIAGNOSIS — E11621 Type 2 diabetes mellitus with foot ulcer: Secondary | ICD-10-CM

## 2016-11-23 DIAGNOSIS — L97421 Non-pressure chronic ulcer of left heel and midfoot limited to breakdown of skin: Secondary | ICD-10-CM | POA: Insufficient documentation

## 2016-11-23 DIAGNOSIS — Z794 Long term (current) use of insulin: Secondary | ICD-10-CM | POA: Insufficient documentation

## 2016-11-23 DIAGNOSIS — Z79899 Other long term (current) drug therapy: Secondary | ICD-10-CM | POA: Insufficient documentation

## 2016-11-23 HISTORY — DX: Type 2 diabetes mellitus with diabetic neuropathy, unspecified: E11.40

## 2016-11-23 MED ORDER — CLINDAMYCIN HCL 300 MG PO CAPS: 300.0000 mg | ORAL_CAPSULE | Freq: Three times a day (TID) | ORAL | 0 refills | Status: AC

## 2016-11-23 MED ORDER — FLUCONAZOLE 150 MG PO TABS: 150.0000 mg | ORAL_TABLET | Freq: Every day | ORAL | 0 refills | Status: DC

## 2016-11-23 MED ORDER — CLINDAMYCIN PHOSPHATE 600 MG/50ML IV SOLN
600.0000 mg | Freq: Once | INTRAVENOUS | Status: AC
  Administered 2016-11-23: 600 mg via INTRAVENOUS
  Filled 2016-11-23: qty 50

## 2016-11-23 NOTE — ED Notes (Signed)
Nonstick dressing applied to left heel.

## 2016-11-23 NOTE — ED Triage Notes (Signed)
Pt comes into the ED via POv c/o left foot wound on her heel.  Patient states it has been there for a while and started as just cracked feet.  Patient is diabetic.  Patient able to ambulate on foot with no difficulties and has been cleaning it with peroxide at home.

## 2016-11-23 NOTE — ED Provider Notes (Signed)
[facility] Emergency Department Provider Note  ____________________________________________  Time seen: Approximately 4:33 PM  I have reviewed the triage vital signs and the nursing notes.   HISTORY  Chief Complaint Wound Check    HPI [name] is a 46 y.o. female that presents to emergency department with wound on left heel. Patient states that wound has been present for 1.5 weeks. It began as cracking skin and has slowly gotten bigger. She states that it was red around wound but that has gotten better since yesterday. She has been cleaning it with peroxide. She denies any drainage from wound. Patient has diabetes. States that her sugars are usually in the 200s. This morning her sugar was 350. Patient denies fever, chills, shortness of breath, chest pain, nausea, vomiting, abdominal pain.   Past Medical History:  Diagnosis Date  . allergic rhinitis   . Anemia    H/O  . Depression   . Diabetes mellitus   . Diabetic neuropathy ([place])   . GERD (gastroesophageal reflux disease)   . Headache   . History of migraine headaches   . tobacco abuse   . UTI (urinary tract infection) 06-2015    Patient Active Problem List   Diagnosis Date Noted  . Facial cellulitis 05/24/2016  . Dysuria 09/07/2015  . GAD (generalized anxiety disorder) 03/27/2015  . Menometrorrhagia 08/21/2011  . Depression   . GERD (gastroesophageal reflux disease)   . History of migraine headaches   . Screening for cervical cancer 07/18/2011  . Type 2 diabetes mellitus, uncontrolled, with neuropathy ([place])     Past Surgical History:  Procedure Laterality Date  . [place]   x 2,  breech, premature 7 wks  . CHOLECYSTECTOMY N/A 07/22/2015   Procedure: LAPAROSCOPIC CHOLECYSTECTOMY ;  Surgeon: Florene Glen, MD;  Location: [facility] ORS;  Service: General;  Laterality: N/A;  . INCISION AND DRAINAGE ABSCESS Left 05/25/2016   Procedure: INCISION AND DRAINAGE ABSCESS;   Surgeon: Clayburn Pert, MD;  Location: [facility] ORS;  Service: General;  Laterality: Left;  . TONSILLECTOMY     as child  . TUBAL LIGATION  1998    Prior to Admission medications   Medication Sig Start Date End Date Taking? Authorizing Provider  clindamycin (CLEOCIN) 300 MG capsule Take 1 capsule (300 mg total) by mouth 3 (three) times daily. 11/23/16 12/03/16  Laban Emperor, PA-C  fluconazole (DIFLUCAN) 150 MG tablet Take 1 tablet (150 mg total) by mouth daily. 11/23/16   Laban Emperor, PA-C  gabapentin (NEURONTIN) 300 MG capsule Take 1 capsule (300 mg total) by mouth 3 (three) times daily. Patient taking differently: Take 600 mg by mouth 3 (three) times daily.  03/31/15   Crecencio Mc, MD  glucose blood test strip Use as instructed up to 4 times daily 04/14/13   Crecencio Mc, MD  hydrOXYzine (ATARAX/VISTARIL) 25 MG tablet Take 25-50 mg by mouth at bedtime.    Historical Provider, MD  INS SYRINGE/NEEDLE 1CC/28G (B-D INSULIN SYRINGE 1CC/28G) 28G X 1/2" 1 ML MISC 1 Syringe by Does not apply route daily after supper. 03/17/15   Crecencio Mc, MD  insulin aspart (NOVOLOG) 100 UNIT/ML injection Inject 40 Units into the skin 3 (three) times daily before meals.    Historical Provider, MD  insulin glargine (LANTUS) 100 UNIT/ML injection Inject 0.7 mLs (70 Units total) into the skin 2 (two) times daily. 05/27/16   Henreitta Leber, MD  Lancets MISC Patient test blood sugar  two times daily. 08/24/11   Crecencio Mc, MD  ondansetron (ZOFRAN ODT) 4 MG disintegrating tablet Take 1 tablet (4 mg total) by mouth every 8 (eight) hours as needed for nausea or vomiting. 07/04/16   [name], MD  sertraline (ZOLOFT) 100 MG tablet TAKE ONE TABLET BY MOUTH ONCE DAILY 09/09/15   Crecencio Mc, MD    Allergies Patient has no known allergies.  Family History  Problem Relation Age of Onset  . Hypertension Mother   . Hypothyroidism Mother   . Thrombocytopenia Mother   . Heart disease Father   . Hyperlipidemia  Father   . Stroke Father   . Diabetes Father   . Hypertension Father     Social History Social History  Substance Use Topics  . Smoking status: Former Smoker    Packs/day: 0.25    Years: 6.00    Types: Cigarettes  . Smokeless tobacco: Never Used  . Alcohol use No     Review of Systems  Constitutional: No fever/chills ENT: No upper respiratory complaints. Cardiovascular: No chest pain. Respiratory: No cough. No SOB. Gastrointestinal: No abdominal pain.  No nausea, no vomiting.  Neurological: Negative for headaches   ____________________________________________   PHYSICAL EXAM:  VITAL SIGNS: ED Triage Vitals  Enc Vitals Group     BP 11/23/16 1501 119/79     Pulse Rate 11/23/16 1501 77     Resp 11/23/16 1501 15     Temp 11/23/16 1501 98.5 F (36.9 C)     Temp Source 11/23/16 1501 Oral     SpO2 11/23/16 1501 98 %     Weight 11/23/16 1502 161 lb 2 oz (73.1 kg)     Height 11/23/16 1502 5\' 4"  (1.626 m)     Head Circumference --      Peak Flow --      Pain Score 11/23/16 1507 7     Pain Loc --      Pain Edu? --      Excl. in [place]? --      Constitutional: Alert and oriented. Well appearing and in no acute distress. Eyes: Conjunctivae are normal. PERRL. EOMI. Head: Atraumatic. ENT:      Ears:      Nose: No congestion/rhinnorhea.      Mouth/Throat: Mucous membranes are moist.  Neck: No stridor.   Cardiovascular: Normal rate, regular rhythm.  Good peripheral circulation. Respiratory: Normal respiratory effort without tachypnea or retractions. Lungs CTAB. Good air entry to the bases with no decreased or absent breath sounds. Musculoskeletal: Full range of motion to all extremities. No gross deformities appreciated. Neurologic:  Normal speech and language. No gross focal neurologic deficits are appreciated.  Skin:  Skin is warm, dry. 1/2 cm shallow wound to left heal. No drainage. Surrounding erythema extending over heal.  Psychiatric: Mood and affect are normal.  Speech and behavior are normal. Patient exhibits appropriate insight and judgement.   ____________________________________________   LABS (all labs ordered are listed, but only abnormal results are displayed)  Labs Reviewed  AEROBIC CULTURE (SUPERFICIAL SPECIMEN)   ____________________________________________  EKG   ____________________________________________  RADIOLOGY Robinette Haines, personally viewed and evaluated these images (plain radiographs) as part of my medical decision making, as well as reviewing the written report by the radiologist.  Dg Foot Complete Left  Result Date: 11/23/2016 CLINICAL DATA:  Ulceration on the lateral calcaneus for 1 week in a diabetic patient. EXAM: LEFT FOOT - COMPLETE 3+ VIEW COMPARISON:  Plain films left  foot 02/09/2011. FINDINGS: No acute bony or joint abnormality is identified. No bony destructive change or periosteal reaction. Skin ulceration is not visualized. No soft tissue gas or radiopaque foreign body. Small calcaneal spurs are noted. IMPRESSION: Negative exam. Electronically Signed   By: Inge Rise M.D.   On: 11/23/2016 16:22    ____________________________________________    PROCEDURES  Procedure(s) performed:    Procedures    Medications  clindamycin (CLEOCIN) IVPB 600 mg (0 mg Intravenous Stopped 11/23/16 1630)     ____________________________________________   INITIAL IMPRESSION / ASSESSMENT AND PLAN / ED COURSE  Pertinent labs & imaging results that were available during my care of the patient were reviewed by me and considered in my medical decision making (see chart for details).  Review of the [place] CSRS was performed in accordance of the [place] prior to dispensing any controlled drugs.   Patient's diagnosis is consistent with diabetic foot ulcer. Vital signs and exam are reassuring. X-ray negative for acute bony abnormalities. Patient was given IV clindamycin in ED. Patient will be discharged home with  prescriptions for clindamycin. Patient states that she gets yeast infections with antibiotics so she'll be given a prescription for a dose of Diflucan. She is to follow up with PCP and wound clinic. Patient is given ED precautions to return to the ED for any worsening or new symptoms.     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 Despite best efforts to proofread, errors can occur which can change the meaning. Any change was purely unintentional.    Laban Emperor, PA-C 11/23/16 [place], MD 11/23/16 2356

## 2016-11-26 LAB — AEROBIC CULTURE  (SUPERFICIAL SPECIMEN)

## 2016-11-26 LAB — AEROBIC CULTURE W GRAM STAIN (SUPERFICIAL SPECIMEN)

## 2017-04-18 ENCOUNTER — Emergency Department: Payer: Self-pay

## 2017-04-18 ENCOUNTER — Encounter: Payer: Self-pay | Admitting: Emergency Medicine

## 2017-04-18 ENCOUNTER — Emergency Department
Admission: EM | Admit: 2017-04-18 | Discharge: 2017-04-18 | Disposition: A | Discharge status: Home / Self Care | Payer: Self-pay | Attending: Emergency Medicine | Admitting: Emergency Medicine

## 2017-04-18 DIAGNOSIS — Z87891 Personal history of nicotine dependence: Secondary | ICD-10-CM | POA: Insufficient documentation

## 2017-04-18 DIAGNOSIS — R1010 Upper abdominal pain, unspecified: Secondary | ICD-10-CM

## 2017-04-18 DIAGNOSIS — Z79899 Other long term (current) drug therapy: Secondary | ICD-10-CM | POA: Insufficient documentation

## 2017-04-18 DIAGNOSIS — R1011 Right upper quadrant pain: Secondary | ICD-10-CM | POA: Insufficient documentation

## 2017-04-18 DIAGNOSIS — Z794 Long term (current) use of insulin: Secondary | ICD-10-CM | POA: Insufficient documentation

## 2017-04-18 DIAGNOSIS — E1140 Type 2 diabetes mellitus with diabetic neuropathy, unspecified: Secondary | ICD-10-CM | POA: Insufficient documentation

## 2017-04-18 LAB — COMPREHENSIVE METABOLIC PANEL
ALT: 24 U/L (ref 14–54)
AST: 42 U/L — ABNORMAL HIGH (ref 15–41)
Albumin: 3.8 g/dL (ref 3.5–5.0)
Alkaline Phosphatase: 80 U/L (ref 38–126)
Anion gap: 15 (ref 5–15)
BUN: 12 mg/dL (ref 6–20)
CO2: 21 mmol/L — ABNORMAL LOW (ref 22–32)
Calcium: 9.3 mg/dL (ref 8.9–10.3)
Chloride: 96 mmol/L — ABNORMAL LOW (ref 101–111)
Creatinine, Ser: 0.86 mg/dL (ref 0.44–1.00)
GFR calc Af Amer: >60 mL/min (ref >60)
GFR calc non Af Amer: >60 mL/min (ref >60)
Glucose, Bld: 427 mg/dL — ABNORMAL HIGH (ref 65–99)
Potassium: 4.4 mmol/L (ref 3.5–5.1)
Sodium: 132 mmol/L — ABNORMAL LOW (ref 135–145)
Total Bilirubin: 1.2 mg/dL (ref 0.3–1.2)
Total Protein: 7.9 g/dL (ref 6.5–8.1)

## 2017-04-18 LAB — URINALYSIS, COMPLETE (UACMP) WITH MICROSCOPIC
Bacteria, UA: NONE SEEN
Bilirubin Urine: NEGATIVE
Glucose, UA: >=500 mg/dL — AB
Ketones, ur: 80 mg/dL — AB
Leukocytes, UA: NEGATIVE
Nitrite: NEGATIVE
Protein, ur: 30 mg/dL — AB
Specific Gravity, Urine: 1.031 — ABNORMAL HIGH (ref 1.005–1.030)
pH: 6 (ref 5.0–8.0)

## 2017-04-18 LAB — CBC WITH DIFFERENTIAL/PLATELET
Basophils Absolute: 0 10*3/uL (ref 0–0.1)
Basophils Relative: 1 %
Eosinophils Absolute: 0.1 10*3/uL (ref 0–0.7)
Eosinophils Relative: 2 %
HCT: 43.8 % (ref 35.0–47.0)
HEMOGLOBIN: 14.7 g/dL (ref 12.0–16.0)
LYMPHS PCT: 13 %
Lymphs Abs: 0.5 10*3/uL — ABNORMAL LOW (ref 1.0–3.6)
MCH: 28.9 pg (ref 26.0–34.0)
MCHC: 33.6 g/dL (ref 32.0–36.0)
MCV: 86 fL (ref 80.0–100.0)
Monocytes Absolute: 0.4 10*3/uL (ref 0.2–0.9)
Monocytes Relative: 11 %
NEUTROS PCT: 73 %
Neutro Abs: 2.7 10*3/uL (ref 1.4–6.5)
Platelets: 140 10*3/uL — ABNORMAL LOW (ref 150–440)
RBC: 5.09 MIL/uL (ref 3.80–5.20)
RDW: 14.4 % (ref 11.5–14.5)
WBC: 3.6 10*3/uL (ref 3.6–11.0)

## 2017-04-18 LAB — LIPASE, BLOOD: Lipase: 39 U/L (ref 11–51)

## 2017-04-18 LAB — TROPONIN I

## 2017-04-18 LAB — PREGNANCY, URINE: PREG TEST UR: NEGATIVE

## 2017-04-18 LAB — GLUCOSE, CAPILLARY: Glucose-Capillary: 302 mg/dL — ABNORMAL HIGH (ref 65–99)

## 2017-04-18 MED ORDER — SODIUM CHLORIDE 0.9 % IV BOLUS (SEPSIS)
250.0000 mL | Freq: Once | INTRAVENOUS | Status: AC
  Administered 2017-04-18: 250 mL via INTRAVENOUS

## 2017-04-18 MED ORDER — OMEPRAZOLE 40 MG PO CPDR: 40.0000 mg | DELAYED_RELEASE_CAPSULE | Freq: Every day | ORAL | 1 refills | Status: AC

## 2017-04-18 MED ORDER — ONDANSETRON HCL 4 MG/2ML IJ SOLN
4.0000 mg | Freq: Once | INTRAMUSCULAR | Status: AC
  Administered 2017-04-18: 4 mg via INTRAVENOUS
  Filled 2017-04-18: qty 2

## 2017-04-18 MED ORDER — IOPAMIDOL (ISOVUE-300) INJECTION 61%
100.0000 mL | Freq: Once | INTRAVENOUS | Status: AC | PRN
  Administered 2017-04-18: 100 mL via INTRAVENOUS

## 2017-04-18 MED ORDER — MORPHINE SULFATE (PF) 2 MG/ML IV SOLN
2.0000 mg | Freq: Once | INTRAVENOUS | Status: AC
  Administered 2017-04-18: 2 mg via INTRAVENOUS
  Filled 2017-04-18: qty 1

## 2017-04-18 MED ORDER — IOPAMIDOL (ISOVUE-300) INJECTION 61%
30.0000 mL | Freq: Once | INTRAVENOUS | Status: AC | PRN
  Administered 2017-04-18: 30 mL via ORAL

## 2017-04-18 NOTE — ED Provider Notes (Signed)
[facility] Emergency Department Provider Note   ____________________________________________   First MD Initiated Contact with Patient 04/18/17 [phone]     (approximate)  I have reviewed the triage vital signs and the nursing notes.   HISTORY  Chief Complaint Abdominal Pain   HPI [name] is a 46 y.o. female who reports right upper quadrant pain radiating up into the chest from the epigastric area as well intermittent for approximately 2 weeks but for the last 2-3 days his been constant and increasing in severity. Patient has had nausea and vomiting in the mornings and around noon for the last 2-3 days but she is able to keep down food and fluids. She denies any fever. She says nothing she does seems to make it better or worse it's not associated with eating. Pain is moderate and achy.   Past Medical History:  Diagnosis Date  . allergic rhinitis   . Anemia    H/O  . Depression   . Diabetes mellitus   . Diabetic neuropathy ([place])   . GERD (gastroesophageal reflux disease)   . Headache   . History of migraine headaches   . tobacco abuse   . UTI (urinary tract infection) 06-2015    Patient Active Problem List   Diagnosis Date Noted  . Facial cellulitis 05/24/2016  . Dysuria 09/07/2015  . GAD (generalized anxiety disorder) 03/27/2015  . Menometrorrhagia 08/21/2011  . Depression   . GERD (gastroesophageal reflux disease)   . History of migraine headaches   . Screening for cervical cancer 07/18/2011  . Type 2 diabetes mellitus, uncontrolled, with neuropathy ([place])     Past Surgical History:  Procedure Laterality Date  . [place]   x 2,  breech, premature 7 wks  . CHOLECYSTECTOMY N/A 07/22/2015   Procedure: LAPAROSCOPIC CHOLECYSTECTOMY ;  Surgeon: Florene Glen, MD;  Location: [facility] ORS;  Service: General;  Laterality: N/A;  . INCISION AND DRAINAGE ABSCESS Left 05/25/2016   Procedure: INCISION AND DRAINAGE ABSCESS;   Surgeon: Clayburn Pert, MD;  Location: [facility] ORS;  Service: General;  Laterality: Left;  . TONSILLECTOMY     as child  . TUBAL LIGATION  1998    Prior to Admission medications   Medication Sig Start Date End Date Taking? Authorizing Provider  gabapentin (NEURONTIN) 400 MG capsule Take 400 mg by mouth 3 (three) times daily.   Yes [provider]  insulin aspart (NOVOLOG) 100 UNIT/ML injection Inject 10-15 Units into the skin 3 (three) times daily with meals. Take 10-15 units based on blood sugar level.   Yes [provider]  insulin detemir (LEVEMIR) 100 UNIT/ML injection Inject 60-80 Units into the skin 2 (two) times daily. Take 80 units in the morning and 60 units at night.   Yes [provider]  ondansetron (ZOFRAN ODT) 4 MG disintegrating tablet Take 1 tablet (4 mg total) by mouth every 8 (eight) hours as needed for nausea or vomiting. 07/04/16  Yes [name], MD  pregabalin (LYRICA) 50 MG capsule Take 50 mg by mouth 3 (three) times daily.   Yes [provider]  sertraline (ZOLOFT) 100 MG tablet TAKE ONE TABLET BY MOUTH ONCE DAILY Patient taking differently: TAKE 150 MG BY MOUTH ONCE DAILY 09/09/15  Yes Crecencio Mc, MD  fluconazole (DIFLUCAN) 150 MG tablet Take 1 tablet (150 mg total) by mouth daily. Patient not taking: Reported on 04/18/2017 11/23/16   Laban Emperor, PA-C  gabapentin (NEURONTIN) 300  MG capsule Take 1 capsule (300 mg total) by mouth 3 (three) times daily. Patient not taking: Reported on 04/18/2017 03/31/15   Crecencio Mc, MD  glucose blood test strip Use as instructed up to 4 times daily 04/14/13   Crecencio Mc, MD  INS SYRINGE/NEEDLE 1CC/28G (B-D INSULIN SYRINGE 1CC/28G) 28G X 1/2" 1 ML MISC 1 Syringe by Does not apply route daily after supper. 03/17/15   Crecencio Mc, MD  insulin glargine (LANTUS) 100 UNIT/ML injection Inject 0.7 mLs (70 Units total) into the skin 2 (two) times daily. Patient not taking: Reported on  04/18/2017 05/27/16   Henreitta Leber, MD  Lancets MISC Patient test blood sugar two times daily. 08/24/11   Crecencio Mc, MD  omeprazole (PRILOSEC) 40 MG capsule Take 1 capsule (40 mg total) by mouth daily. 04/18/17 04/18/18  Nena Polio, MD    Allergies Patient has no known allergies.  Family History  Problem Relation Age of Onset  . Hypertension Mother   . Hypothyroidism Mother   . Thrombocytopenia Mother   . Heart disease Father   . Hyperlipidemia Father   . Stroke Father   . Diabetes Father   . Hypertension Father     Social History Social History  Substance Use Topics  . Smoking status: Former Smoker    Packs/day: 0.25    Years: 6.00    Types: Cigarettes  . Smokeless tobacco: Never Used  . Alcohol use No    Review of Systems  Constitutional: No fever/chills Eyes: No visual changes. ENT: No sore throat. Cardiovascular: Denies chest pain. Respiratory: Denies shortness of breath. Gastrointestinal: See history of present illness Genitourinary: Negative for dysuria. Musculoskeletal: Negative for back pain. Skin: Negative for rash. Neurological: Negative for headaches, focal weakness or numbness.   ____________________________________________   PHYSICAL EXAM:  VITAL SIGNS: ED Triage Vitals  Enc Vitals Group     BP 04/18/17 0902 115/75     Pulse Rate 04/18/17 0902 85     Resp 04/18/17 0902 18     Temp 04/18/17 0901 98.5 F (36.9 C)     Temp Source 04/18/17 0901 Oral     SpO2 04/18/17 0902 98 %     Weight 04/18/17 0901 167 lb (75.8 kg)     Height 04/18/17 0901 5\' 4"  (1.626 m)     Head Circumference --      Peak Flow --      Pain Score --      Pain Loc --      Pain Edu? --      Excl. in [place]? --     Constitutional: Alert and oriented. Well appearing and in no acute distress. Eyes: Conjunctivae are normal.  Head: Atraumatic. Nose: No congestion/rhinnorhea. Mouth/Throat: Mucous membranes are moist.  Oropharynx non-erythematous. Neck: No stridor.     Cardiovascular: Normal rate, regular rhythm. Grossly normal heart sounds.  Good peripheral circulation. Respiratory: Normal respiratory effort.  No retractions. Lungs CTAB. Gastrointestinal: Soft Decreased bowel sounds tender to palpation and percussion in the right upper and right lower quadrant worst pain appears to be over the area where the gallbladder would be expected to be No distention. No abdominal bruits. No CVA tenderness. Musculoskeletal: No lower extremity tenderness nor edema.  No joint effusions. Neurologic:  Normal speech and language. No gross focal neurologic deficits are appreciated. Skin:  Skin is warm, dry and intact. No rash noted. Psychiatric: Mood and affect are normal. Speech and behavior are normal.  ____________________________________________  LABS (all labs ordered are listed, but only abnormal results are displayed)  Labs Reviewed  COMPREHENSIVE METABOLIC PANEL - Abnormal; Notable for the following:       Result Value   Sodium 132 (*)    Chloride 96 (*)    CO2 21 (*)    Glucose, Bld 427 (*)    AST 42 (*)    All other components within normal limits  CBC WITH DIFFERENTIAL/PLATELET - Abnormal; Notable for the following:    Platelets 140 (*)    Lymphs Abs 0.5 (*)    All other components within normal limits  URINALYSIS, COMPLETE (UACMP) WITH MICROSCOPIC - Abnormal; Notable for the following:    Color, Urine YELLOW (*)    APPearance CLEAR (*)    Specific Gravity, Urine 1.031 (*)    Glucose, UA >=500 (*)    Hgb urine dipstick SMALL (*)    Ketones, ur 80 (*)    Protein, ur 30 (*)    Squamous Epithelial / LPF 6-30 (*)    All other components within normal limits  GLUCOSE, CAPILLARY - Abnormal; Notable for the following:    Glucose-Capillary 302 (*)    All other components within normal limits  LIPASE, BLOOD  PREGNANCY, URINE  TROPONIN I   ____________________________________________  EKG  EKG read and interpreted by me shows normal sinus rhythm  rate of 87 normal axis no acute ST-T wave changes ____________________________________________  RADIOLOGY    ____________________________________________   PROCEDURES  Procedure(s) performed:   Procedures  Critical Care performed: ____________________________________________   INITIAL IMPRESSION / ASSESSMENT AND PLAN / ED COURSE  Pertinent labs & imaging results that were available during my care of the patient were reviewed by me and considered in my medical decision making (see chart for details).        ____________________________________________   FINAL CLINICAL IMPRESSION(S) / ED DIAGNOSES  Final diagnoses:  Pain of upper abdomen      NEW MEDICATIONS STARTED DURING THIS VISIT:  Discharge Medication List as of 04/18/2017  1:28 PM    START taking these medications   Details  omeprazole (PRILOSEC) 40 MG capsule Take 1 capsule (40 mg total) by mouth daily., Starting Thu 04/18/2017, Until Fri 04/18/2018, Print         Note:  This document was prepared using Dragon voice recognition software and may include unintentional dictation errors.    Nena Polio, MD 04/18/17 [phone]

## 2017-04-18 NOTE — ED Triage Notes (Signed)
Pt c/o RUQ pain intermittent X 2 weeks but worse today. Pain radiates into epigastric area as well.  Has had vomiting with sx as well.  No fevers.  No urinary sx.

## 2017-04-18 NOTE — Discharge Instructions (Signed)
Take the Prilosec once a day. Please follow-up with your regular doctor at [facility]. Return here if worse.

## 2017-04-18 NOTE — ED Notes (Signed)
Patient transported to CT 

## 2018-01-30 ENCOUNTER — Emergency Department: Payer: Self-pay

## 2018-01-30 ENCOUNTER — Other Ambulatory Visit: Payer: Self-pay

## 2018-01-30 ENCOUNTER — Encounter: Payer: Self-pay | Admitting: Emergency Medicine

## 2018-01-30 ENCOUNTER — Emergency Department
Admission: EM | Admit: 2018-01-30 | Discharge: 2018-01-30 | Disposition: A | Discharge status: Home / Self Care | Payer: Self-pay | Attending: Emergency Medicine | Admitting: Emergency Medicine

## 2018-01-30 DIAGNOSIS — R739 Hyperglycemia, unspecified: Secondary | ICD-10-CM

## 2018-01-30 DIAGNOSIS — R079 Chest pain, unspecified: Secondary | ICD-10-CM | POA: Insufficient documentation

## 2018-01-30 DIAGNOSIS — Z79899 Other long term (current) drug therapy: Secondary | ICD-10-CM | POA: Insufficient documentation

## 2018-01-30 DIAGNOSIS — E1165 Type 2 diabetes mellitus with hyperglycemia: Secondary | ICD-10-CM | POA: Insufficient documentation

## 2018-01-30 DIAGNOSIS — Z794 Long term (current) use of insulin: Secondary | ICD-10-CM | POA: Insufficient documentation

## 2018-01-30 HISTORY — DX: Polyneuropathy, unspecified: G62.9

## 2018-01-30 HISTORY — DX: Anxiety disorder, unspecified: F41.9

## 2018-01-30 LAB — BASIC METABOLIC PANEL
ANION GAP: 14 (ref 5–15)
BUN: 10 mg/dL (ref 6–20)
CALCIUM: 9 mg/dL (ref 8.9–10.3)
CO2: 21 mmol/L — AB (ref 22–32)
Chloride: 95 mmol/L — ABNORMAL LOW (ref 101–111)
Creatinine, Ser: 0.58 mg/dL (ref 0.44–1.00)
GFR calc Af Amer: >60 mL/min (ref >60)
GFR calc non Af Amer: >60 mL/min (ref >60)
Glucose, Bld: 564 mg/dL (ref 65–99)
POTASSIUM: 3.5 mmol/L (ref 3.5–5.1)
SODIUM: 130 mmol/L — AB (ref 135–145)

## 2018-01-30 LAB — CBC
HCT: 45.6 % (ref 35.0–47.0)
HEMOGLOBIN: 15.8 g/dL (ref 12.0–16.0)
MCH: 32.2 pg (ref 26.0–34.0)
MCHC: 34.7 g/dL (ref 32.0–36.0)
MCV: 92.9 fL (ref 80.0–100.0)
PLATELETS: 233 10*3/uL (ref 150–440)
RBC: 4.91 MIL/uL (ref 3.80–5.20)
RDW: 12.7 % (ref 11.5–14.5)
WBC: 5.9 10*3/uL (ref 3.6–11.0)

## 2018-01-30 LAB — TROPONIN I: Troponin I: <0.03 ng/mL (ref <0.03)

## 2018-01-30 LAB — GLUCOSE, CAPILLARY: GLUCOSE-CAPILLARY: 224 mg/dL — AB (ref 65–99)

## 2018-01-30 MED ORDER — MORPHINE SULFATE (PF) 4 MG/ML IV SOLN
4.0000 mg | Freq: Once | INTRAVENOUS | Status: AC
  Administered 2018-01-30: 4 mg via INTRAVENOUS
  Filled 2018-01-30: qty 1

## 2018-01-30 MED ORDER — INSULIN ASPART 100 UNIT/ML ~~LOC~~ SOLN
5.0000 [IU] | Freq: Once | SUBCUTANEOUS | Status: AC
  Administered 2018-01-30: 5 [IU] via INTRAVENOUS
  Filled 2018-01-30: qty 1

## 2018-01-30 MED ORDER — ONDANSETRON HCL 4 MG/2ML IJ SOLN
4.0000 mg | Freq: Once | INTRAMUSCULAR | Status: AC
  Administered 2018-01-30: 4 mg via INTRAVENOUS
  Filled 2018-01-30: qty 2

## 2018-01-30 MED ORDER — SODIUM CHLORIDE 0.9 % IV BOLUS
1000.0000 mL | Freq: Once | INTRAVENOUS | Status: AC
  Administered 2018-01-30: 1000 mL via INTRAVENOUS

## 2018-01-30 NOTE — ED Provider Notes (Signed)
[facility] Emergency Department Provider Note  ___________________________________________   First MD Initiated Contact with Patient 01/30/18 1523     (approximate)  I have reviewed the triage vital signs and the nursing notes.   HISTORY  Chief Complaint Chest Pain   HPI [name] is a 47 y.o. female with a history of anxiety, depression and diabetes who is presenting to the emergency department today with 5 days of chest pain.  He says that the pain is to the center of her chest and radiating to the bilateral arms as well as her back.  She says the pain is a "15 out of 10."  She says the pain is been constant.  Does not report shortness of breath, nausea or vomiting.  There are no exacerbating or alleviating factors.  Patient says that she also has aching to her bilateral lower extremities but attributes this to her diabetic neuropathy.  Says that she is compliant with her medications.  Family history of heart disease.  Patient smokes cigarettes occasionally.  No illicit drug use or drinking.  Past Medical History:  Diagnosis Date  . allergic rhinitis   . Anemia    H/O  . Anxiety   . Depression   . Diabetes mellitus   . Diabetic neuropathy ([place])   . GERD (gastroesophageal reflux disease)   . Headache   . History of migraine headaches   . Neuropathy   . tobacco abuse   . UTI (urinary tract infection) 06-2015    Patient Active Problem List   Diagnosis Date Noted  . Facial cellulitis 05/24/2016  . Dysuria 09/07/2015  . GAD (generalized anxiety disorder) 03/27/2015  . Menometrorrhagia 08/21/2011  . Depression   . GERD (gastroesophageal reflux disease)   . History of migraine headaches   . Screening for cervical cancer 07/18/2011  . Type 2 diabetes mellitus, uncontrolled, with neuropathy ([place])     Past Surgical History:  Procedure Laterality Date  . [place]   x 2,  breech, premature 7 wks  . CHOLECYSTECTOMY N/A  07/22/2015   Procedure: LAPAROSCOPIC CHOLECYSTECTOMY ;  Surgeon: Florene Glen, MD;  Location: [facility] ORS;  Service: General;  Laterality: N/A;  . INCISION AND DRAINAGE ABSCESS Left 05/25/2016   Procedure: INCISION AND DRAINAGE ABSCESS;  Surgeon: Clayburn Pert, MD;  Location: [facility] ORS;  Service: General;  Laterality: Left;  . TONSILLECTOMY     as child  . TUBAL LIGATION  1998    Prior to Admission medications   Medication Sig Start Date End Date Taking? Authorizing Provider  fluconazole (DIFLUCAN) 150 MG tablet Take 1 tablet (150 mg total) by mouth daily. Patient not taking: Reported on 04/18/2017 11/23/16   Laban Emperor, PA-C  gabapentin (NEURONTIN) 300 MG capsule Take 1 capsule (300 mg total) by mouth 3 (three) times daily. Patient not taking: Reported on 04/18/2017 03/31/15   Crecencio Mc, MD  gabapentin (NEURONTIN) 400 MG capsule Take 400 mg by mouth 3 (three) times daily.    [provider]  glucose blood test strip Use as instructed up to 4 times daily 04/14/13   Crecencio Mc, MD  INS SYRINGE/NEEDLE 1CC/28G (B-D INSULIN SYRINGE 1CC/28G) 28G X 1/2" 1 ML MISC 1 Syringe by Does not apply route daily after supper. 03/17/15   Crecencio Mc, MD  insulin aspart (NOVOLOG) 100 UNIT/ML injection Inject 10-15 Units into the skin 3 (three) times daily with meals. Take 10-15 units based on blood  sugar level.    [provider]  insulin detemir (LEVEMIR) 100 UNIT/ML injection Inject 60-80 Units into the skin 2 (two) times daily. Take 80 units in the morning and 60 units at night.    [provider]  insulin glargine (LANTUS) 100 UNIT/ML injection Inject 0.7 mLs (70 Units total) into the skin 2 (two) times daily. Patient not taking: Reported on 04/18/2017 05/27/16   Henreitta Leber, MD  Lancets MISC Patient test blood sugar two times daily. 08/24/11   Crecencio Mc, MD  omeprazole (PRILOSEC) 40 MG capsule Take 1 capsule (40 mg total) by mouth daily. 04/18/17 04/18/18  Nena Polio, MD  ondansetron (ZOFRAN ODT) 4 MG disintegrating tablet Take 1 tablet (4 mg total) by mouth every 8 (eight) hours as needed for nausea or vomiting. 07/04/16   [name], MD  pregabalin (LYRICA) 50 MG capsule Take 50 mg by mouth 3 (three) times daily.    [provider]  sertraline (ZOLOFT) 100 MG tablet TAKE ONE TABLET BY MOUTH ONCE DAILY Patient taking differently: TAKE 150 MG BY MOUTH ONCE DAILY 09/09/15   Crecencio Mc, MD    Allergies Patient has no known allergies.  Family History  Problem Relation Age of Onset  . Hypertension Mother   . Hypothyroidism Mother   . Thrombocytopenia Mother   . Heart disease Father   . Hyperlipidemia Father   . Stroke Father   . Diabetes Father   . Hypertension Father     Social History Social History   Tobacco Use  . Smoking status: Former Smoker    Packs/day: 0.25    Years: 6.00    Pack years: 1.50    Types: Cigarettes  . Smokeless tobacco: Never Used  Substance Use Topics  . Alcohol use: No  . Drug use: No    Review of Systems  Constitutional: No fever/chills Eyes: No visual changes. ENT: No sore throat. Cardiovascular: As above Respiratory: Denies shortness of breath. Gastrointestinal: No abdominal pain.  No nausea, no vomiting.  No diarrhea.  No constipation. Genitourinary: Negative for dysuria. Musculoskeletal: Negative for back pain. Skin: Negative for rash. Neurological: Negative for headaches, focal weakness or numbness.   ____________________________________________   PHYSICAL EXAM:  VITAL SIGNS: ED Triage Vitals  Enc Vitals Group     BP 01/30/18 1151 117/77     Pulse Rate 01/30/18 1151 82     Resp 01/30/18 1151 18     Temp 01/30/18 1151 97.8 F (36.6 C)     Temp Source 01/30/18 1151 Oral     SpO2 01/30/18 1151 98 %     Weight 01/30/18 1153 137 lb (62.1 kg)     Height 01/30/18 1153 5\' 4"  (1.626 m)     Head Circumference --      Peak Flow --      Pain Score 01/30/18 1152 10      Pain Loc --      Pain Edu? --      Excl. in [place]? --     Constitutional: Alert and oriented. Well appearing and in no acute distress. Eyes: Conjunctivae are normal.  Head: Atraumatic. Nose: No congestion/rhinnorhea. Mouth/Throat: Mucous membranes are moist.  Neck: No stridor.   Cardiovascular: Normal rate, regular rhythm. Grossly normal heart sounds.  Good peripheral circulation with equal and bilateral radial pulses. Respiratory: Normal respiratory effort.  No retractions. Lungs CTAB. Gastrointestinal: Soft and nontender. No distention.  Musculoskeletal: No lower extremity tenderness nor edema.  No joint effusions. Neurologic:  Normal speech and language. No gross focal neurologic deficits are appreciated. Skin:  Skin is warm, dry and intact. No rash noted. Psychiatric: Mood and affect are normal. Speech and behavior are normal.  ____________________________________________   LABS (all labs ordered are listed, but only abnormal results are displayed)  Labs Reviewed  BASIC METABOLIC PANEL - Abnormal; Notable for the following components:      Result Value   Sodium 130 (*)    Chloride 95 (*)    CO2 21 (*)    Glucose, Bld 564 (*)    All other components within normal limits  CBC  TROPONIN I  TROPONIN I   ____________________________________________  EKG  ED ECG REPORT I, Doran Stabler, the attending physician, personally viewed and interpreted this ECG.   Date: 01/30/2018  EKG Time: 1146  Rate: 81  Rhythm: normal sinus rhythm  Axis: Normal  Intervals:none  ST&T Change: No ST segment elevation or depression.  No abnormal T wave inversion.  ____________________________________________  RADIOLOGY  Chest x-ray without active cardiopulmonary disease. ____________________________________________   PROCEDURES  Procedure(s) performed:   Procedures  Critical Care performed:   ____________________________________________   INITIAL IMPRESSION / ASSESSMENT  AND PLAN / ED COURSE  Pertinent labs & imaging results that were available during my care of the patient were reviewed by me and considered in my medical decision making (see chart for details).  Differential diagnosis includes, but is not limited to, ACS, aortic dissection, pulmonary embolism, cardiac tamponade, pneumothorax, pneumonia, pericarditis, myocarditis, GI-related causes including esophagitis/gastritis, and musculoskeletal chest wall pain.   As part of my medical decision making, I reviewed the following data within the electronic MEDICAL RECORD NUMBER Notes from prior ED visits  ----------------------------------------- 5:24 PM on 01/30/2018 -----------------------------------------  Previous ER records reviewed.  Patient appears to have a chronically elevated blood glucose.  Also with last ER visit in August for chest pain as well which was attributed to musculoskeletal causes.  Patient has not followed up with cardiology.  ----------------------------------------- 7:49 PM on 01/30/2018 -----------------------------------------  Patient at this time says that she is still having chest pain but is without any objective signs of distress.  Seems to be calm and sitting in her bed.  5 days of 15 out of 10 chest pain with reassuring EKG as well as 2- troponins.  Equal pulses.  Reassuring vital signs. PERC negative.  Patient will follow up with her primary care doctor regarding her blood sugar as well as the cardiologist guarding her chest pain which she has been seen for twice now emergency department since August.  Patient as well as family understanding of the treatment plan as well as the diagnosis and willing to comply. ____________________________________________   FINAL CLINICAL IMPRESSION(S) / ED DIAGNOSES  Chest pain.  Hyperglycemia.    NEW MEDICATIONS STARTED DURING THIS VISIT:  New Prescriptions   No medications on file     Note:  This document was prepared using  Dragon voice recognition software and may include unintentional dictation errors.     Orbie Pyo, MD 01/30/18 [phone]

## 2018-01-30 NOTE — ED Triage Notes (Signed)
Had dental work done and had to be on 2 course of antibiotics ;-- the pain started after that.

## 2018-01-30 NOTE — ED Triage Notes (Signed)
Central chest pain radiating through to back also causing neck pain and bilateral arm numbness.

## 2018-01-30 NOTE — ED Triage Notes (Signed)
Arrives with C/O mid chest pain x 1 week.  States arms also feel numb and legs hurt.  Sent from PCP for ED evaluation.

## 2018-01-31 ENCOUNTER — Telehealth: Payer: Self-pay

## 2018-01-31 NOTE — Telephone Encounter (Signed)
Attempted to call patient and schedule ED fu  Seen on 01/30/18 for CP No voicemail no answer  Will try again at a later time

## 2018-02-06 NOTE — Telephone Encounter (Signed)
Attempted to call patient and schedule ED fu  Seen on 01/30/18 for CP No voicemail no answer  Will try again at a later time

## 2018-02-11 NOTE — Telephone Encounter (Signed)
No ans no vm .   °Mailed Letter  °

## 2018-03-12 ENCOUNTER — Other Ambulatory Visit: Payer: Self-pay

## 2018-03-12 ENCOUNTER — Ambulatory Visit: Payer: Self-pay | Attending: Oncology | Admitting: *Deleted

## 2018-03-12 ENCOUNTER — Encounter (INDEPENDENT_AMBULATORY_CARE_PROVIDER_SITE_OTHER): Payer: Self-pay

## 2018-03-12 ENCOUNTER — Ambulatory Visit
Admission: RE | Admit: 2018-03-12 | Discharge: 2018-03-12 | Disposition: A | Discharge status: Home / Self Care | Payer: Self-pay | Source: Ambulatory Visit | Attending: Oncology | Admitting: Oncology

## 2018-03-12 VITALS — BP 103/69 | HR 81 | Temp 98.0°F | Ht 65.0 in | Wt 151.5 lb

## 2018-03-12 DIAGNOSIS — Z0000 Encounter for general adult medical examination without abnormal findings: Secondary | ICD-10-CM | POA: Insufficient documentation

## 2018-03-12 NOTE — Patient Instructions (Signed)
Gave patient hand-out, Women Staying Healthy, Active and Well from [facility], with education on breast health, pap smears, heart and colon health. 

## 2018-03-12 NOTE — Progress Notes (Signed)
  Subjective:     Patient ID: [name], female   DOB: 1970/11/21, 47 y.o.   MRN: [id number]  HPI   Review of Systems     Objective:   Physical Exam  Pulmonary/Chest: Right breast exhibits no inverted nipple, no mass, no nipple discharge, no skin change and no tenderness. Left breast exhibits no inverted nipple, no mass, no nipple discharge, no skin change and no tenderness.         Assessment:     47 year old female presents to [facility] for clinical breast exam and baseline mammogram.  Clinical breast exam unremarkable.  Patient states her left has been inverted since the birth of her children.  It is not visibly inverted today on her exam.  Taught self breast awareness.  Last pap on 4/17 was negative without HPV co-testing.  Patient has been screened for eligibility.  She does not have any insurance, Medicare or Medicaid.  She also meets financial eligibility.  Hand-out given on the Affordable Care Act.    Plan:     Screening mammogram ordered.  Will follow-up per [facility] protocol.

## 2018-03-13 ENCOUNTER — Encounter: Payer: Self-pay | Admitting: *Deleted

## 2018-03-13 NOTE — Progress Notes (Signed)
Letter mailed from [facility] to inform patient of her normal mammogram results.  Patient is to follow-up with annual screening in one year.  HSIS to [place]. 

## 2018-04-03 ENCOUNTER — Ambulatory Visit: Payer: Self-pay | Admitting: Pharmacy Technician

## 2018-04-03 ENCOUNTER — Encounter (INDEPENDENT_AMBULATORY_CARE_PROVIDER_SITE_OTHER): Payer: Self-pay

## 2018-04-03 DIAGNOSIS — Z79899 Other long term (current) drug therapy: Secondary | ICD-10-CM

## 2018-04-03 NOTE — Progress Notes (Signed)
Met with patient completed financial assistance application for [place] due to recent ED visit.  Patient agreed to be responsible for gathering financial information and forwarding to appropriate department in [facility].    Completed [facility] application and contract.  Patient agreed to all terms of the [facility] contract.    Patient approved to receive medication assistance at [facility] through 2019, as long as eligibility criteria continues to be met.    Provided patient with community resource material based on her particular needs.    Levemir & Novolog Prescription Applications completed with patient.  Forwarded to Dr. Lynford Humphrey for signature.  Upon receipt of signed applications from provider, [place] will be submitted to [employer].  [place] [facility]

## 2018-06-02 ENCOUNTER — Ambulatory Visit: Payer: Medicaid Other | Admitting: Pharmacist

## 2018-06-02 ENCOUNTER — Encounter (INDEPENDENT_AMBULATORY_CARE_PROVIDER_SITE_OTHER): Payer: Self-pay

## 2018-06-02 VITALS — BP 96/80 | Ht 64.0 in

## 2018-06-02 DIAGNOSIS — Z79899 Other long term (current) drug therapy: Secondary | ICD-10-CM

## 2018-06-02 NOTE — Progress Notes (Addendum)
Medication Management Clinic Visit Note  Patient: [name] MRN: [id number] Date of Birth: Jan 24, 1971 PCP: [facility]   DENYCE HARR 47 y.o. female presents for a medication therapy management visit today.  Ht 5\' 4"  (1.626 m)   BMI 26.00 kg/m   Patient Information   Past Medical History:  Diagnosis Date  . allergic rhinitis   . Anemia    H/O  . Anxiety   . Depression   . Diabetes mellitus   . Diabetic neuropathy ([place])   . GERD (gastroesophageal reflux disease)   . Headache   . History of migraine headaches   . Neuropathy   . tobacco abuse   . UTI (urinary tract infection) 06-2015      Past Surgical History:  Procedure Laterality Date  . [place]   x 2,  breech, premature 7 wks  . CHOLECYSTECTOMY N/A 07/22/2015   Procedure: LAPAROSCOPIC CHOLECYSTECTOMY ;  Surgeon: Florene Glen, MD;  Location: [facility] ORS;  Service: General;  Laterality: N/A;  . INCISION AND DRAINAGE ABSCESS Left 05/25/2016   Procedure: INCISION AND DRAINAGE ABSCESS;  Surgeon: Clayburn Pert, MD;  Location: [facility] ORS;  Service: General;  Laterality: Left;  . TONSILLECTOMY     as child  . TUBAL LIGATION  1998     Family History  Problem Relation Age of Onset  . Hypertension Mother   . Hypothyroidism Mother   . Thrombocytopenia Mother   . Heart disease Father   . Hyperlipidemia Father   . Stroke Father   . Diabetes Father   . Hypertension Father   . Breast cancer Neg Hx     New Diagnoses (since last visit):   Family Support: Good  Lifestyle Diet: Breakfast: sausage, egg, and cheese crossiant Lunch: sandwich Dinner: protein and some vegetables Drinks: diet sodas and water            Social History   Substance and Sexual Activity  Alcohol Use No      Social History   Tobacco Use  Smoking Status Former Smoker  . Packs/day: 0.25  . Years: 6.00  . Pack years: 1.50  . Types: Cigarettes  Smokeless Tobacco Never Used       Health Maintenance  Topic Date Due  . PNEUMOCOCCAL POLYSACCHARIDE VACCINE AGE 29-64 HIGH RISK  05/21/1971  . OPHTHALMOLOGY EXAM  05/21/1979  . HIV Screening  05/20/1984  . FOOT EXAM  12/18/2014  . URINE MICROALBUMIN  03/16/2016  . HEMOGLOBIN A1C  11/21/2016  . INFLUENZA VACCINE  04/17/2018  . PAP SMEAR  12/21/2018  . TETANUS/TDAP  05/22/2026   Outpatient Encounter Medications as of 06/02/2018  Medication Sig  . DULoxetine (CYMBALTA) 60 MG capsule Take 60 mg by mouth daily.  . furosemide (LASIX) 20 MG tablet Take 20 mg by mouth 2 (two) times daily.  Marland Kitchen gabapentin (NEURONTIN) 400 MG capsule Take 400 mg by mouth 3 (three) times daily.  . hydrOXYzine (ATARAX/VISTARIL) 25 MG tablet Take 25 mg by mouth.  . insulin aspart (NOVOLOG) 100 UNIT/ML injection Inject 10-15 Units into the skin 3 (three) times daily with meals. Take 10-15 units based on blood sugar level.  . insulin detemir (LEVEMIR) 100 UNIT/ML injection Inject 60-80 Units into the skin 2 (two) times daily. Take 80 units in the morning and 60 units at night.   . Lancets MISC Patient test blood sugar two times daily.  . metformin (FORTAMET) 500 MG (OSM) 24 hr tablet Take  500 mg by mouth 2 (two) times daily.  . pregabalin (LYRICA) 50 MG capsule Take 50 mg by mouth 3 (three) times daily.  Marland Kitchen glucose blood test strip Use as instructed up to 4 times daily  . INS SYRINGE/NEEDLE 1CC/28G (B-D INSULIN SYRINGE 1CC/28G) 28G X 1/2" 1 ML MISC 1 Syringe by Does not apply route daily after supper.  Marland Kitchen omeprazole (PRILOSEC) 40 MG capsule Take 1 capsule (40 mg total) by mouth daily.  . [DISCONTINUED] fluconazole (DIFLUCAN) 150 MG tablet Take 1 tablet (150 mg total) by mouth daily. (Patient not taking: Reported on 04/18/2017)  . [DISCONTINUED] gabapentin (NEURONTIN) 300 MG capsule Take 1 capsule (300 mg total) by mouth 3 (three) times daily. (Patient not taking: Reported on 04/18/2017)  . [DISCONTINUED] insulin glargine (LANTUS) 100 UNIT/ML injection  Inject 0.7 mLs (70 Units total) into the skin 2 (two) times daily. (Patient not taking: Reported on 04/18/2017)  . [DISCONTINUED] ondansetron (ZOFRAN ODT) 4 MG disintegrating tablet Take 1 tablet (4 mg total) by mouth every 8 (eight) hours as needed for nausea or vomiting.  . [DISCONTINUED] sertraline (ZOLOFT) 100 MG tablet TAKE ONE TABLET BY MOUTH ONCE DAILY (Patient taking differently: TAKE 150 MG BY MOUTH ONCE DAILY)   No facility-administered encounter medications on file as of 06/02/2018.    Health Maintenance/Date Completed  Last ED visit: 01/30/2018 [facility], 02/01/18 [facility] Last Visit to PCP: 05/09/18 Next Visit to PCP: 06/10/18 Dental Exam: 05/06/18 Eye Exam: 05/29/18 Mammogram: 03/12/18    Assessment and Plan: Compliance: Patient was able to list all of her medications, provide the dosing, and indications. Patient is assisted daily by her sister with managing her medications and hospital visits.  Anxiety:  Patient is taking hydroxyzine. Hydroxyzine provides additional benefit with sleep. Patient does not complain of any side effects with this medication.  Depression: Patient is taking duloxetine for the management of depression. Patient does not complain of any side effects from this medication.  Diabetes: Patient is taking metformin, levermir, and novolog for the management of diabetes.   GERD: Patient is taking omeprazole daily. Patient is well managed with this medication.   Neuropathy: Patient is taking gabapentin, pregablin, and duloxetine for management of neuropathy. Patient does not complain of any side effects from these medications.  Will contact primary care physician about possible drug-drug-interaction between gabapentin and pregablin.  Next visit: 1 year  Khian Remo PharmD Candidate  Cosigned by: Netta Neat, PharmD, [facility]) [phone]

## 2018-06-03 ENCOUNTER — Other Ambulatory Visit: Payer: Self-pay

## 2018-06-26 ENCOUNTER — Telehealth: Payer: Self-pay | Admitting: Pharmacy Technician

## 2018-06-26 NOTE — Telephone Encounter (Signed)
Patient verbally verified that she has prescription drug coverage with Medicaid.  Patient no longer meets [facility]'s eligibility criteria.  Patient acknowledged that she understands that [facility] will no longer be able to provide medication assistance.  Prescriptions transferred to [facility].  [facility]

## 2018-11-18 ENCOUNTER — Ambulatory Visit: Payer: Medicaid Other | Attending: Surgery | Admitting: Physical Therapy

## 2018-11-18 ENCOUNTER — Other Ambulatory Visit: Payer: Self-pay

## 2018-11-18 ENCOUNTER — Encounter: Payer: Self-pay | Admitting: Physical Therapy

## 2018-11-18 ENCOUNTER — Telehealth: Payer: Self-pay | Admitting: Physical Therapy

## 2018-11-18 DIAGNOSIS — R2681 Unsteadiness on feet: Secondary | ICD-10-CM | POA: Diagnosis present

## 2018-11-18 DIAGNOSIS — M6281 Muscle weakness (generalized): Secondary | ICD-10-CM | POA: Insufficient documentation

## 2018-11-18 DIAGNOSIS — R262 Difficulty in walking, not elsewhere classified: Secondary | ICD-10-CM

## 2018-11-18 DIAGNOSIS — R209 Unspecified disturbances of skin sensation: Secondary | ICD-10-CM | POA: Insufficient documentation

## 2018-11-18 NOTE — Telephone Encounter (Signed)
Called pt to let her know I moved her next appointment originally scheduled fro 3/11 at 1pm to 6pm that night to better accommodate her schedule. Requested a phone call back at [phone] to confirm that the change would work for her.

## 2018-11-18 NOTE — Therapy (Signed)
[facility] [address], [place], [address] Phone: [phone]   Fax:  [phone]  Physical Therapy Evaluation  Patient Details  Name: [name] MRN: [id number] Date of Birth: Feb 14, 1971 Referring Provider (PT): Myna Bright PA   Encounter Date: 11/18/2018  PT End of Session - 11/18/18 1640    Visit Number  1    Number of Visits  16    Date for PT Re-Evaluation  01/13/19    Authorization Type  Medicaid reporting period from 11/18/2018    Authorization Time Period  Current cert period: 11/22/1694 - 01/13/2019 (latest PN: IE 11/18/2018)    Authorization - Visit Number  1    Authorization - Number of Visits  1    PT Start Time  1100    PT Stop Time  1220    PT Time Calculation (min)  80 min    Equipment Utilized During Treatment  Gait belt    Activity Tolerance  Patient tolerated treatment well;Patient limited by fatigue    Behavior During Therapy  [facility] for tasks assessed/performed       Past Medical History:  Diagnosis Date  . allergic rhinitis   . Anemia    H/O  . Anxiety   . Depression   . Diabetes mellitus   . Diabetic neuropathy ([place])   . GERD (gastroesophageal reflux disease)   . Headache   . History of migraine headaches   . Neuropathy   . UTI (urinary tract infection) 06-2015    Past Surgical History:  Procedure Laterality Date  . [place]   x 2,  breech, premature 7 wks  . CHOLECYSTECTOMY N/A 07/22/2015   Procedure: LAPAROSCOPIC CHOLECYSTECTOMY ;  Surgeon: Florene Glen, MD;  Location: [facility] ORS;  Service: General;  Laterality: N/A;  . INCISION AND DRAINAGE ABSCESS Left 05/25/2016   Procedure: INCISION AND DRAINAGE ABSCESS;  Surgeon: Clayburn Pert, MD;  Location: [facility] ORS;  Service: General;  Laterality: Left;  . TONSILLECTOMY     as child  . TUBAL LIGATION  1998    There were no vitals filed for this visit.   Subjective Assessment - 11/18/18 1125     Subjective  Patient's sister attended her appointment with her and contributed information. Pt frequently deferred to sister when asked questions about her condition. Patient states condition started last year in May. At that time her A1C was 15 and had been near this for several years. Her foot drop is attributed to peripheral neuropathy and has improved some since that time as has her A1C (last 7.9 in January). She states her right foot got weak out of no where. An MRI was performed on her back to rule out back as source of problems. She reports "it is degenerating" but the doctors did not attribute her leg pain and weakness to her back. She will be getting a nerve conduction test on 12/04/2018. The plan is to try and strengthen her foot with PT prior to trying AFOs. She has some mild weakness, loss of sensation, and pain in the left LE, but not as much as the right. Uses FWW in community. No AD in home.      Patient is accompained by:  Family member    Pertinent History  Patient is a 48 y.o. female who presents to outpatient physical therapy with a referral for medical diagnosis right foot drop. This patient's chief complaints consist of imbalance,  difficulty walking, swelling, pain, and stiffness, and weaknes leading to the following functional deficits: difficulty with functional mobility including transfers, household and community mobility, community and social participation, increased fall risk, difficulty with ADLs and IADLs, unable to work due to this problem.    How long can you sit comfortably?  < 30 min    How long can you stand comfortably?  < 5-6 min    How long can you walk comfortably?  < 20 min    Diagnostic tests  Imaging: MRI report dated 07/22/2018: "Degenerative changes result in moderate spinal canal narrowing at L3-L5, and mild to moderate neuroforaminal narrowing at L3-4"    Patient Stated Goals  Improve strength and foot drop, improve pain    Currently in Pain?  Yes    Pain Score   5     Pain Location  Leg    top of right foot, ankle, anterior lower leg, occasionally lateral thigh, hip and back. Occasionally left side but nothing like right side.   Pain Orientation  Right    Pain Descriptors / Indicators  Aching;Burning   aching, needles, burning (only occasionally).    Pain Type  Chronic pain    Pain Radiating Towards   top of right foot, ankle, anterior lower leg, occasionally lateral thigh, hip and back. Occasionally left side but nothing like right side.    Pain Onset  More than a month ago    Pain Frequency  Constant    Aggravating Factors   sitting too long (Feels like it swells), being on feet too much, falling, turning ankle,    Pain Relieving Factors  resting    Effect of Pain on Daily Activities  difficulty with functional mobility including transfers, household and community mobility, community and social participation, increased fall risk, difficulty with ADLs and IADLs, unable to work due to this problem.        [facility] PT Assessment - 11/18/18 0001      Assessment   Medical Diagnosis  right foot drop    Referring Provider (PT)  Francee Gentile A. PA    Onset Date/Surgical Date  01/15/18    Next MD Visit  Early May    Prior Therapy  none for this problem      Precautions   Precautions  Fall      Restrictions   Weight Bearing Restrictions  No      Balance Screen   Has the patient fallen in the past 6 months  Yes    How many times?  2-3    Has the patient had a decrease in activity level because of a fear of falling?   Yes    Is the patient reluctant to leave their home because of a fear of falling?   Yes      [place] residence    Available Help at Discharge  Family;Other (Comment)   son who works   Type of [place] to enter    Entrance Stairs-Number of Steps  5    Entrance Stairs-Rails  Right;Left;Cannot reach both    [place]  One level    [place] -  2 wheels;Wheelchair - manual      Prior Function   Level of Independence  Independent    Vocation  On disability   Last worked at [employer]. Disability due to current conditio   Vocation  Requirements  used to be standing all day    Leisure  crochet, visit friends, dog.      Cognition   Overall Cognitive Status  Within Functional Limits for tasks assessed      Observation/Other Assessments   Observations  see note from 11/18/2018 for latest objective data      Functional Gait  Assessment   Gait Level Surface  Walks 20 ft, slow speed, abnormal gait pattern, evidence for imbalance or deviates 10-15 in outside of the 12 in walkway width. Requires more than 7 sec to ambulate 20 ft.    Change in Gait Speed  Makes only minor adjustments to walking speed, or accomplishes a change in speed with significant gait deviations, deviates 10-15 in outside the 12 in walkway width, or changes speed but loses balance but is able to recover and continue walking.    Gait with Horizontal Head Turns  Performs head turns smoothly with slight change in gait velocity (eg, minor disruption to smooth gait path), deviates 6-10 in outside 12 in walkway width, or uses an assistive device.    Gait with Vertical Head Turns  Performs task with slight change in gait velocity (eg, minor disruption to smooth gait path), deviates 6 - 10 in outside 12 in walkway width or uses assistive device    Gait and Pivot Turn  Turns slowly, requires verbal cueing, or requires several small steps to catch balance following turn and stop    Step Over Obstacle  Is able to step over one shoe box (4.5 in total height) but must slow down and adjust steps to clear box safely. May require verbal cueing.    Gait with Narrow Base of Support  Ambulates less than 4 steps heel to toe or cannot perform without assistance.    Gait with Eyes Closed  Walks 20 ft, slow speed, abnormal gait pattern, evidence for imbalance, deviates 10-15 in outside 12 in walkway  width. Requires more than 9 sec to ambulate 20 ft.    Ambulating Backwards  Walks 20 ft, slow speed, abnormal gait pattern, evidence for imbalance, deviates 10-15 in outside 12 in walkway width.    Steps  Two feet to a stair, must use rail.    Total Score  11    FGA comment:  < 19 = high risk fall       OBJECTIVE: OBSERVATION/INSPECTION: Patient presents with thoracic hyperkyphosis.  No abnormal swelling or edema noted in bilateral feet.  Feet clean, dry, and skin intact except lateral aspect of let 5th digit toe where pt has a bandaid covering toenail where she states she cut it too short.  NEUROLOGICAL: Dermatomes: WNL bilaterally to light touch except below gastroc.  SENORY: Unable to feel light touch bilaterally. Able to feel pressure at left and right heel, 5th MTP and left great MTP. Unable to feel right great MTP.  Upper Motor Neuron Screen: Babinski, and Clonus (ankle) negative bilaterally.   PERIPHERAL JOINT MOTION (AROM/PROM in degrees):  *Indicates pain -  Hip = WFL exept - Flexion: R = groin pain,. - External rotation: R = groin pain excessive motion, L = excessive motion - Internal rotation: major limitation bilaterally Knee = grossly WFL Ankle = grossly WFL PROM   STRENGTH:  *Indicates pain -  Hip  - Flexion: R = 4/5, L = 4+/5. - Extension: R = 4/5 pain anterior lower leg, L = 5/5. - Abduction: R = 3+/5, L = 4/5. Knee - Ext: R = 4/5  pain anterior lower leg, L = 5/5. - Flex: R = 4/5, L = 5/5. Ankle (seated position) - Dorsiflexion: R = 3-/5, L = 3+/5. - Plantarflexion: R = 3+/5, L = 4+/5. - Eversion: R = 2/5, L = 3/5. - Great toe extension: R = 2/5, L = 3+/5. - Inversion: R = 3+/5, L = 4+/5. - Able to toe walk with minimal lift in heels bilaterally, right feels weaker - Unable to heel walk bilaterally, left feels weaker.   REPEATED MOTIONS TESTING: Prone press up: during = increasing abdominal pain; after = no obvious effect  SPECIAL TESTS: Straight  leg raise (SLR): R = foot pain not sensitive to sensitizing manuever, L = negative. FABER: R = positive for groin pain, L = negative.  FUNCTIONAL MOBILITY: - Bed mobility: rolling and supine <> sit I with some difficulty. - Transfers: sit <> stand mod I due to unsteadiness on feet.  - Gait: ambulates mod I with FWW or min A to prevent falls. steppage gait in swing phase of right leg due to foot drop, feet do not pass each other, short step length, slow pace, unsteady without AD or asistance. - Stairs: step together pattern facing laterally with BUE support on railing, mod I.  FUNCTIONAL/BALANCE TESTS: 5TSTS: 18.5 seconds with BUE support.    Objective measurements completed on examination: See above findings.     TREATMENT:  Denies Hx spinal surgeries Denies Hx of long term steroid use Denies latext sensitivity   Therapeutic exercise: to centralize symptoms and improve ROM, strength, muscular endurance, and activity tolerance required for successful completion of functional activities.  - seated towel scoots inversion and eversion x 10 each direction each foot to improve foot/ankle strength for improved balance and functional ability. Cuing for technique.  - Education on diagnosis, prognosis, POC, anatomy and physiology of current condition.  - Education on HEP including handout    HOME EXERCISE PROGRAM Access Code: [email or web address]  URL: [email or web address]  Date: 11/18/2018  Prepared by: Rosita Kea   Exercises  Ankle Inversion Eversion Towel Slide - 10-15 reps - 1-3 Sets - 2x daily    PT Education - 11/18/18 1639    Education Details  Exercise purpose/form. Self management techniques. Education on diagnosis, prognosis, POC, anatomy and physiology of current condition Education on HEP including handout     Person(s) Educated  Patient    Methods  Explanation;Demonstration;Tactile cues;Verbal cues    Comprehension  Verbalized understanding;Returned demonstration        PT Short Term Goals - 11/18/18 1655      PT SHORT TERM GOAL #1   Title  Be independent with initial home exercise program for self-management of symptoms.    Baseline  Initial HEP provided at initial eval (11/18/2018);     Time  2    Period  Weeks    Status  New    Target Date  12/02/18        PT Long Term Goals - 11/18/18 1657      PT LONG TERM GOAL #1   Title  Be independent with a long-term home exercise program for self-management of symptoms.     Baseline  Initial HEP provided at IE (11/18/2018);     Time  8    Period  Weeks    Status  New    Target Date  01/13/19      PT LONG TERM GOAL #2   Title  Improve Functional Gait Assessment to equal or greater than 25/30  to demonstrate low fall risk.     Baseline  11/30 (11/18/2018);     Time  8    Period  Weeks    Status  New    Target Date  01/13/19      PT LONG TERM GOAL #3   Title  Improve ankle strength to be able to heel walk and toe walk bilaterally to improve ability to amblulate without loss of balance.     Baseline  Able to toe walk with minimal lift in heels bilaterally, right feels weaker, Unable to heel walk bilaterally, left feels weaker (11/18/2018);     Time  8    Period  Weeks    Status  New    Target Date  01/13/19      PT LONG TERM GOAL #4   Title  Improve 5TSTS to equal or less than 12 seconds with no UE support demonstrate improved functional strenth for transfers    Baseline  5TSTS: 18.5 seconds with BUE support (11/18/2018):     Time  8    Period  Weeks    Status  New    Target Date  01/13/19      PT LONG TERM GOAL #5   Title  Demonstrate bilateral hip and knee strength of equal or greater than 4+/5 bilaterally to improve functional strenth and ability to transfer and complete community mobility with decreased fall risk and improved confidence.     Baseline  see objective exam (11/18/2018);     Time  8    Period  Weeks    Status  New    Target Date  01/13/19         Plan - 11/18/18 1642     Clinical Impression Statement  Patient is a 48 y.o. female referred to outpatient physical therapy with a medical diagnosis of right foot drop who presents with signs and symptoms consistent with weakness, difficulty walking, high fall risk, unsteady on feet. Patient presents with significant pain, sensation, weakness, imbalance, decreased activity tolerance, instability of B ankles, gait deviation impairments that are limiting ability to complete basic ADLs, IADLs, household and community mobility, transfers, stairs, community participation, driving without difficulty. Patient will benefit from skilled physical therapy intervention to address current body structure impairments and activity limitations to improve function and work towards goals set in current POC in order to return to prior level of function or maximal functional improvement.     Personal Factors and Comorbidities  Comorbidity 3+;Fitness;Time since onset of injury/illness/exacerbation;Past/Current Experience;Behavior Pattern;Education;Transportation    Comorbidities  DMII (with history of several years of uncontrolled blood glucose, diabetic neuropathy, retinal problems, GERD, Hx UTIs (cause chest pain), Hx of migraine headaches, depression ,anxiety.    Examination-Activity Limitations  Bend;Carry;Locomotion Level;Squat;Transfers;Lift;Dressing;Stairs;Stand;Sit    Examination-Participation Restrictions  Yard Work;Cleaning;Laundry;Community Activity;Driving;Shop    Stability/Clinical Decision Making  Evolving/Moderate complexity    Clinical Decision Making  Moderate    Rehab Potential  Fair    PT Frequency  2x / week    PT Duration  8 weeks    PT Treatment/Interventions  ADLs/Self Care Home Management;Gait training;Stair training;Functional mobility training;Therapeutic activities;Therapeutic exercise;Balance training;Neuromuscular re-education;Patient/family education;Manual techniques;Passive range of motion;Dry needling;Energy  conservation;Taping;Spinal Manipulations;Joint Manipulations   joint mobilizations grade I-IV   PT Next Visit Plan  continue with balance, strength, and funcitonal training as tolerated.     PT Home Exercise Plan  Medbridge Access Code: [email or web address]     Recommended Other Services  possible AFO for right foot drop  Consulted and Agree with Plan of Care  Patient;Family member/caregiver    Family Member Consulted  sister       Patient will benefit from skilled therapeutic intervention in order to improve the following deficits and impairments:  Abnormal gait, Decreased balance, Decreased endurance, Decreased mobility, Difficulty walking, Impaired sensation, Decreased range of motion, Impaired perceived functional ability  Visit Diagnosis: Unsteadiness on feet  Difficulty in walking, not elsewhere classified  Muscle weakness (generalized)  Unspecified disturbances of skin sensation     Problem List Patient Active Problem List   Diagnosis Date Noted  . Facial cellulitis 05/24/2016  . Dysuria 09/07/2015  . GAD (generalized anxiety disorder) 03/27/2015  . Menometrorrhagia 08/21/2011  . Depression   . GERD (gastroesophageal reflux disease)   . History of migraine headaches   . Screening for cervical cancer 07/18/2011  . Type 2 diabetes mellitus, uncontrolled, with neuropathy ([place])     Nancy Nordmann, PT, DPT 11/18/2018, 5:12 PM  [facility] [address] [place], [address] Phone: [phone]   Fax:  [phone]  Name: TANESSA TIDD MRN: [id number] Date of Birth: 1971/04/01

## 2018-11-19 ENCOUNTER — Telehealth: Payer: Self-pay | Admitting: Physical Therapy

## 2018-11-19 NOTE — Telephone Encounter (Signed)
Called again to confirm appt change from 1pm to 6pm on Tues 11/26/2018. No answer. Left message explaining the change and requesting a call back to confirm at [phone].

## 2018-11-25 ENCOUNTER — Encounter: Payer: Self-pay | Admitting: Physical Therapy

## 2018-11-25 ENCOUNTER — Ambulatory Visit: Payer: Medicaid Other | Admitting: Physical Therapy

## 2018-11-25 DIAGNOSIS — R2681 Unsteadiness on feet: Secondary | ICD-10-CM | POA: Diagnosis not present

## 2018-11-25 DIAGNOSIS — R209 Unspecified disturbances of skin sensation: Secondary | ICD-10-CM

## 2018-11-25 DIAGNOSIS — M6281 Muscle weakness (generalized): Secondary | ICD-10-CM

## 2018-11-25 DIAGNOSIS — R262 Difficulty in walking, not elsewhere classified: Secondary | ICD-10-CM

## 2018-11-25 NOTE — Therapy (Signed)
[facility] [address], [place], [address] Phone: [phone]   Fax:  [phone]  Physical Therapy Treatment  Patient Details  Name: [name] MRN: [id number] Date of Birth: 1971/05/24 Referring Provider (PT): Myna Bright PA   Encounter Date: 11/25/2018  PT End of Session - 11/26/18 1433    Visit Number  2    Number of Visits  16    Date for PT Re-Evaluation  01/13/19    Authorization Type  Medicaid reporting period from 11/18/2018    Authorization Time Period  Current cert period: 03/25/7281 - 01/13/2019 (latest PN: IE 11/18/2018)    Authorization - Visit Number  1    Authorization - Number of Visits  3    PT Start Time  1800    PT Stop Time  1845    PT Time Calculation (min)  45 min    Equipment Utilized During Treatment  Gait belt    Activity Tolerance  Patient tolerated treatment well;Patient limited by fatigue    Behavior During Therapy  [facility] for tasks assessed/performed;Anxious       Past Medical History:  Diagnosis Date  . allergic rhinitis   . Anemia    H/O  . Anxiety   . Depression   . Diabetes mellitus   . Diabetic neuropathy ([place])   . GERD (gastroesophageal reflux disease)   . Headache   . History of migraine headaches   . Neuropathy   . UTI (urinary tract infection) 06-2015    Past Surgical History:  Procedure Laterality Date  . [place]   x 2,  breech, premature 7 wks  . CHOLECYSTECTOMY N/A 07/22/2015   Procedure: LAPAROSCOPIC CHOLECYSTECTOMY ;  Surgeon: Florene Glen, MD;  Location: [facility] ORS;  Service: General;  Laterality: N/A;  . INCISION AND DRAINAGE ABSCESS Left 05/25/2016   Procedure: INCISION AND DRAINAGE ABSCESS;  Surgeon: Clayburn Pert, MD;  Location: [facility] ORS;  Service: General;  Laterality: Left;  . TONSILLECTOMY     as child  . TUBAL LIGATION  1998    There were no vitals filed for this visit.  Subjective Assessment - 11/25/18  1810    Subjective  Patient reports she is feeling well upon arrival but states she thinks she has a UTI. She was tested for it and came back positive enought to prescribed medication for an UTI.  She was sore follwoign her last treatment and but very sore then next day. She stayed active and it got better.     Patient is accompained by:  Family member    Pertinent History  Patient is a 48 y.o. female who presents to outpatient physical therapy with a referral for medical diagnosis right foot drop. This patient's chief complaints consist of imbalance, difficulty walking, swelling, pain, and stiffness, and weaknes leading to the following functional deficits: difficulty with functional mobility including transfers, household and community mobility, community and social participation, increased fall risk, difficulty with ADLs and IADLs, unable to work due to this problem.    How long can you sit comfortably?  < 30 min    How long can you stand comfortably?  < 5-6 min    How long can you walk comfortably?  < 20 min    Diagnostic tests  Imaging: MRI report dated 07/22/2018: "Degenerative changes result in moderate spinal canal narrowing at L3-L5, and mild to moderate neuroforaminal narrowing at L3-4"  Patient Stated Goals  Improve strength and foot drop, improve pain    Currently in Pain?  No/denies    Pain Onset  More than a month ago        TREATMENT:  Denies Hx spinal surgeries Denies Hx of long term steroid use Denies latext sensitivity   Therapeutic exercise:to centralize symptoms and improve ROM, strength, muscular endurance, and activity tolerance required for successful completion of functional activities.  - NuStep level 1 using bilateral upper and lower extremities. Seat/handle setting 9. SPM = or greater to 60 SPM.  For improved extremity mobility, muscular endurance, and activity tolerance; and to induce the analgesic effect of aerobic exercise, stimulate improved joint nutrition, and  prepare body structures and systems for following interventions. x 7.5  Minutes during subjective exam. Plus time to assist on and off machine.   Therapeutic activities: for functional strengthening and improved functional activity tolerance. - sit <> stand from standard chair, x 10 with no UE support, x 10 with overhead left of 3kg ball. To improve immediate standing balance and transfer strength. Cuing for posture.  - resisted walking with black theratube around waist, 10 foot space with CGA- min A for safety and to prevent loss of balance and decrease compensations. X 10 each forward/retro, retro/forward, R/L lateral, L/R lateral. To improve hip, LE, and ankle strength and motor control for improved balance.   Neuromuscular Re-education: to improve, balance, postural strength, muscle activation patterns, and stabilization strength required for functional activities:  - standing with narrow stance on airex (complient surface) with head turns and touch down UE support. SBA for safety. Cuing for technique. To improve ankle strategy for improved balance. 1 minute.  - standing with self-selected stance on airex (compliant surface) forward and backward leans to challenge limits of stability and improve balance, touch down UE support attempting to control with feet. Cuing for technique. SBA guarding for safety. 1 min. - standing double cone taps, 2x10 each side. Unilateral UE support as needed. To improve balance for dynamic activities. SBA-CGA for safety.   HOME EXERCISE PROGRAM Access Code: [email or web address]  URL: [email or web address]  Date: 11/25/2018  Prepared by: Rosita Kea   Exercises  Ankle Inversion Eversion Towel Slide - 10-15 reps - 1-3 Sets - 2x daily  Sit to Stand without Arm Support - 1-3 sets - 10 reps - 1-3x daily       PT Short Term Goals - 11/18/18 1655      PT SHORT TERM GOAL #1   Title  Be independent with initial home exercise program for self-management of  symptoms.    Baseline  Initial HEP provided at initial eval (11/18/2018);     Time  2    Period  Weeks    Status  New    Target Date  12/02/18        PT Long Term Goals - 11/18/18 1657      PT LONG TERM GOAL #1   Title  Be independent with a long-term home exercise program for self-management of symptoms.     Baseline  Initial HEP provided at IE (11/18/2018);     Time  8    Period  Weeks    Status  New    Target Date  01/13/19      PT LONG TERM GOAL #2   Title  Improve Functional Gait Assessment to equal or greater than 25/30 to demonstrate low fall risk.     Baseline  11/30 (11/18/2018);  Time  8    Period  Weeks    Status  New    Target Date  01/13/19      PT LONG TERM GOAL #3   Title  Improve ankle strength to be able to heel walk and toe walk bilaterally to improve ability to amblulate without loss of balance.     Baseline  Able to toe walk with minimal lift in heels bilaterally, right feels weaker, Unable to heel walk bilaterally, left feels weaker (11/18/2018);     Time  8    Period  Weeks    Status  New    Target Date  01/13/19      PT LONG TERM GOAL #4   Title  Improve 5TSTS to equal or less than 12 seconds with no UE support demonstrate improved functional strenth for transfers    Baseline  5TSTS: 18.5 seconds with BUE support (11/18/2018):     Time  8    Period  Weeks    Status  New    Target Date  01/13/19      PT LONG TERM GOAL #5   Title  Demonstrate bilateral hip and knee strength of equal or greater than 4+/5 bilaterally to improve functional strenth and ability to transfer and complete community mobility with decreased fall risk and improved confidence.     Baseline  see objective exam (11/18/2018);     Time  8    Period  Weeks    Status  New    Target Date  01/13/19            Plan - 11/26/18 1532    Clinical Impression Statement  Patient tolerated treatment well and is making appropriate progress towards goals at this point. She was anxious at  times leading to timidity with movement that hindered balance and exercise performance until she started to get more comfortable with repetition. Updated HEP to include sit <> stand. Pt is determined to conquer her anxiety and limitation and required minimal encouragement for participation. Does not appear to be adverse to DOMS. Pt was able to progress to more weight bearing and balance exercises and required close monitoring form clinician to ensure no loss of balance. Patient will benefit from skilled physical therapy intervention to address current body structure impairments and activity limitations to improve function and work towards goals set in current POC in order to return to prior level of function or maximal functional improvement.    Personal Factors and Comorbidities  Comorbidity 3+;Fitness;Time since onset of injury/illness/exacerbation;Past/Current Experience;Behavior Pattern;Education;Transportation    Comorbidities  DMII (with history of several years of uncontrolled blood glucose, diabetic neuropathy, retinal problems, GERD, Hx UTIs (cause chest pain), Hx of migraine headaches, depression ,anxiety.    Examination-Activity Limitations  Bend;Carry;Locomotion Level;Squat;Transfers;Lift;Dressing;Stairs;Stand;Sit    Examination-Participation Restrictions  Yard Work;Cleaning;Laundry;Community Activity;Driving;Shop    Stability/Clinical Decision Making  Evolving/Moderate complexity    Rehab Potential  Fair    PT Frequency  2x / week    PT Duration  8 weeks    PT Treatment/Interventions  ADLs/Self Care Home Management;Gait training;Stair training;Functional mobility training;Therapeutic activities;Therapeutic exercise;Balance training;Neuromuscular re-education;Patient/family education;Manual techniques;Passive range of motion;Dry needling;Energy conservation;Taping;Spinal Manipulations;Joint Manipulations   joint mobilizations grade I-IV   PT Next Visit Plan  continue with balance, strength,  and funcitonal training as tolerated. Update HEP as appropriate    PT Home Exercise Plan  Medbridge Access Code: [email or web address]     Consulted and Agree with Plan of Care  Patient  Patient will benefit from skilled therapeutic intervention in order to improve the following deficits and impairments:  Abnormal gait, Decreased balance, Decreased endurance, Decreased mobility, Difficulty walking, Impaired sensation, Decreased range of motion, Impaired perceived functional ability  Visit Diagnosis: Unsteadiness on feet  Difficulty in walking, not elsewhere classified  Muscle weakness (generalized)  Unspecified disturbances of skin sensation     Problem List Patient Active Problem List   Diagnosis Date Noted  . Facial cellulitis 05/24/2016  . Dysuria 09/07/2015  . GAD (generalized anxiety disorder) 03/27/2015  . Menometrorrhagia 08/21/2011  . Depression   . GERD (gastroesophageal reflux disease)   . History of migraine headaches   . Screening for cervical cancer 07/18/2011  . Type 2 diabetes mellitus, uncontrolled, with neuropathy ([place])     Nancy Nordmann, PT, DPT 11/26/2018, 3:35 PM  [facility] [address] [place], [address] Phone: [phone]   Fax:  [phone]  Name: [name] MRN: [id number] Date of Birth: 03-Nov-1970

## 2018-11-27 ENCOUNTER — Ambulatory Visit: Payer: Medicaid Other | Admitting: Physical Therapy

## 2018-12-01 ENCOUNTER — Encounter: Payer: Self-pay | Admitting: Physical Therapy

## 2018-12-01 ENCOUNTER — Other Ambulatory Visit: Payer: Self-pay

## 2018-12-01 ENCOUNTER — Ambulatory Visit: Payer: Medicaid Other | Admitting: Physical Therapy

## 2018-12-01 DIAGNOSIS — R2681 Unsteadiness on feet: Secondary | ICD-10-CM

## 2018-12-01 DIAGNOSIS — R262 Difficulty in walking, not elsewhere classified: Secondary | ICD-10-CM

## 2018-12-01 DIAGNOSIS — R209 Unspecified disturbances of skin sensation: Secondary | ICD-10-CM

## 2018-12-01 DIAGNOSIS — M6281 Muscle weakness (generalized): Secondary | ICD-10-CM

## 2018-12-01 NOTE — Therapy (Signed)
[facility] [address] [place], [address] Phone: [phone]   Fax:  [phone]  Physical Therapy Treatment  Patient Details  Name: [name] MRN: [id number] Date of Birth: [birth year] Referring Provider (PT): Myna Bright PA   Encounter Date: 12/01/2018  PT End of Session - 12/02/18 0821    Visit Number  3    Number of Visits  16    Date for PT Re-Evaluation  01/13/19    Authorization Type  Medicaid reporting period from 11/18/2018    Authorization Time Period  Current cert period: 0/0/9381 - 01/13/2019 (latest PN: IE 11/18/2018)    Authorization - Visit Number  2    Authorization - Number of Visits  3    PT Start Time  8299    PT Stop Time  1800    PT Time Calculation (min)  45 min    Equipment Utilized During Treatment  Gait belt    Activity Tolerance  Patient tolerated treatment well;Patient limited by fatigue    Behavior During Therapy  [facility] for tasks assessed/performed;Anxious       Past Medical History:  Diagnosis Date  . allergic rhinitis   . Anemia    H/O  . Anxiety   . Depression   . Diabetes mellitus   . Diabetic neuropathy ([place])   . GERD (gastroesophageal reflux disease)   . Headache   . History of migraine headaches   . Neuropathy   . UTI (urinary tract infection) 06-2015    Past Surgical History:  Procedure Laterality Date  . [place]   x 2,  breech, premature 7 wks  . CHOLECYSTECTOMY N/A 07/22/2015   Procedure: LAPAROSCOPIC CHOLECYSTECTOMY ;  Surgeon: Florene Glen, MD;  Location: [facility] ORS;  Service: General;  Laterality: N/A;  . INCISION AND DRAINAGE ABSCESS Left 05/25/2016   Procedure: INCISION AND DRAINAGE ABSCESS;  Surgeon: Clayburn Pert, MD;  Location: [facility] ORS;  Service: General;  Laterality: Left;  . TONSILLECTOMY     as child  . TUBAL LIGATION  1998    There were no vitals filed for this visit.  Subjective Assessment - 12/01/18  1723    Subjective  Patient reports she has some back pain upon arrival of about 6/10 pain in her back and legs, left more than right.  She has had a nerve conduction study (see reports results below) and it showed lumbar and cervical "pinched nerve" and right carpal tunnel.    Patient is accompained by:  Family member    Pertinent History  Patient is a 48 y.o. female who presents to outpatient physical therapy with a referral for medical diagnosis right foot drop. This patient's chief complaints consist of imbalance, difficulty walking, swelling, pain, and stiffness, and weaknes leading to the following functional deficits: difficulty with functional mobility including transfers, household and community mobility, community and social participation, increased fall risk, difficulty with ADLs and IADLs, unable to work due to this problem.    How long can you sit comfortably?  < 30 min    How long can you stand comfortably?  < 5-6 min    How long can you walk comfortably?  < 20 min    Diagnostic tests  Imaging: MRI report dated 07/22/2018: "Degenerative changes result in moderate spinal canal narrowing at L3-L5, and mild to moderate neuroforaminal narrowing at L3-4" [facility] report 11/26/2018 "Conclusions:  Abnormal study. There is electrodiagnostic evidence of:  1. Severe length-dependent sensorimotor axonal polyneuropathy, as  can be seen in patients with diabetes.  2. Subacute-chronic right L5 radiculopathy, with active  denervation seen in the right tibialis anterior.  3. Subacute-chronic right C8 radiculopathy, with active  denervation seen in the right first dorsal interosseous as well  as cervical paraspinals. 4. Moderate right median mononeuropathy at the wrist, as can be seen in carpal tunnel syndrome."      Patient Stated Goals  Improve strength and foot drop, improve pain    Currently in Pain?  Yes    Pain Score  6     Pain Location  Back    Pain Orientation  Lower    Pain Descriptors /  Indicators  Aching;Burning    Pain Type  Chronic pain    Pain Radiating Towards  radiating into both legs    Pain Onset  More than a month ago       TREATMENT: Denies Hx spinal surgeries Denies Hx of long term steroid use Denies latext sensitivity  Therapeutic exercise:to centralize symptoms and improve ROM, strength, muscular endurance, and activity tolerance required for successful completion of functional activities. - NuStep level 3 using bilateral upper and lower extremities. Seat/handle setting 9. SPM = or greater to 60 SPM.  For improved extremity mobility, muscular endurance, and activity tolerance; and to induce the analgesic effect of aerobic exercise, stimulate improved joint nutrition, and prepare body structures and systems for following interventions. x 5  Minutes during subjective exam. Plus time to assist on and off machine.   Therapeutic activities: for functional strengthening and improved functional activity tolerance. - sit <> stand from standard chair, 2x 10 with overhead lift of 7lb dumbbell. To improve immediate standing balance and transfer strength. Cuing for posture.    - step over and back 6-inch hurdle while keeping following leg in place. Directly orward, forward diagonal, back diagonal, directly back, and crossed behind. X 5 rounds each direction with CGA-min A for safety and cuing to keep hips forward to prevent compensations. To improve balance for dynamic activities and stepping strategy to reduce fall risk.   Neuromuscular Re-education: to improve, balance, postural strength, muscle activation patterns, and stabilization strength required for functional activities:  - standing with narrow stance on airex (complient surface) with head turns and touch down UE support. SBA for safety. Cuing for technique. To improve ankle strategy for improved balance. 2 minutes.  - standing with self-selected stance on airex (compliant surface) forward and backward leans to  challenge limits of stability and improve balance, touch down UE support attempting to control with feet. Cuing for technique. SBA guarding for safety. 2 min. - standing double cone taps, x10 each side. Unilateral UE support as needed. To improve balance for dynamic activities.SBA-CGA for safety.    HOME EXERCISE PROGRAM Access Code: [email or web address]  URL: [email or web address]  Date: 12/01/2018  Prepared by: Rosita Kea   Exercises  Ankle Inversion Eversion Towel Slide - 10-15 reps - 1-3 Sets - 2x daily  Sit to Stand without Arm Support - 1-3 sets - 10 reps - 1-3x daily  Standing Toe Taps  Side Step Overs with [place] and Unilateral Counter Support     PT Education - 12/02/18 5573    Education Details  Exercise purpose/form. Self management techniques. Education on diagnosis, prognosis, POC, anatomy and physiology of current condition Education on HEP    Person(s) Educated  Patient    Methods  Explanation;Demonstration;Tactile cues;Verbal cues    Comprehension  Verbalized understanding;Returned  demonstration       PT Short Term Goals - 12/02/18 9379      PT SHORT TERM GOAL #1   Title  Be independent with initial home exercise program for self-management of symptoms.    Baseline  Initial HEP provided at initial eval (11/18/2018);     Time  2    Period  Weeks    Status  Achieved    Target Date  12/02/18        PT Long Term Goals - 11/18/18 1657      PT LONG TERM GOAL #1   Title  Be independent with a long-term home exercise program for self-management of symptoms.     Baseline  Initial HEP provided at IE (11/18/2018);     Time  8    Period  Weeks    Status  New    Target Date  01/13/19      PT LONG TERM GOAL #2   Title  Improve Functional Gait Assessment to equal or greater than 25/30 to demonstrate low fall risk.     Baseline  11/30 (11/18/2018);     Time  8    Period  Weeks    Status  New    Target Date  01/13/19      PT LONG TERM GOAL #3   Title  Improve  ankle strength to be able to heel walk and toe walk bilaterally to improve ability to amblulate without loss of balance.     Baseline  Able to toe walk with minimal lift in heels bilaterally, right feels weaker, Unable to heel walk bilaterally, left feels weaker (11/18/2018);     Time  8    Period  Weeks    Status  New    Target Date  01/13/19      PT LONG TERM GOAL #4   Title  Improve 5TSTS to equal or less than 12 seconds with no UE support demonstrate improved functional strenth for transfers    Baseline  5TSTS: 18.5 seconds with BUE support (11/18/2018):     Time  8    Period  Weeks    Status  New    Target Date  01/13/19      PT LONG TERM GOAL #5   Title  Demonstrate bilateral hip and knee strength of equal or greater than 4+/5 bilaterally to improve functional strenth and ability to transfer and complete community mobility with decreased fall risk and improved confidence.     Baseline  see objective exam (11/18/2018);     Time  8    Period  Weeks    Status  New    Target Date  01/13/19            Plan - 12/02/18 0240    Clinical Impression Statement  Patient tolerated treatment well and is making appropriate progress towards goals at this point. She was anxious and frustrated at times, especially when attempting to complete toe taps with left foot (due to difficulty with SLS balance on right). She continues to be more comfortable with trying more challenging task. Overhead lift was modifed a few reps due to R arm pain. She now has official diagnostic data for right L5 and C8 radiculopathy as well as diagnostic evidence for compression at right carpal tunnel. Will be seeing a spinal specialist. The right lumbar radiculopathy may be contributing to her difficulty with balance, but she also has evidence of peripheral neuropathy that will affect it as  well.  Updated HEP to include toe taps and stepping over objects with instructions to complete only with family supervision. Pt highly  motivated to complete these exercises at home. Pt was able to progress to more weight bearing and balance exercises and required close monitoring form clinician to ensure no loss of balance. Patient will benefit from skilled physical therapy intervention to address current body structure impairments and activity limitations to improve function and work towards goals set in current POC in order to return to prior level of function or maximal functional improvement.    Personal Factors and Comorbidities  Comorbidity 3+;Fitness;Time since onset of injury/illness/exacerbation;Past/Current Experience;Behavior Pattern;Education;Transportation    Comorbidities  DMII (with history of several years of uncontrolled blood glucose, diabetic neuropathy, retinal problems, GERD, Hx UTIs (cause chest pain), Hx of migraine headaches, depression ,anxiety.    Examination-Activity Limitations  Bend;Carry;Locomotion Level;Squat;Transfers;Lift;Dressing;Stairs;Stand;Sit    Examination-Participation Restrictions  Yard Work;Cleaning;Laundry;Community Activity;Driving;Shop    Stability/Clinical Decision Making  Evolving/Moderate complexity    Rehab Potential  Fair    PT Frequency  2x / week    PT Duration  8 weeks    PT Treatment/Interventions  ADLs/Self Care Home Management;Gait training;Stair training;Functional mobility training;Therapeutic activities;Therapeutic exercise;Balance training;Neuromuscular re-education;Patient/family education;Manual techniques;Passive range of motion;Dry needling;Energy conservation;Taping;Spinal Manipulations;Joint Manipulations   joint mobilizations grade I-IV   PT Next Visit Plan  continue with balance, strength, and funcitonal training as tolerated. Update HEP as appropriate    PT Home Exercise Plan  Medbridge Access Code: [email or web address]     Consulted and Agree with Plan of Care  Patient       Patient will benefit from skilled therapeutic intervention in order to improve the following deficits  and impairments:  Abnormal gait, Decreased balance, Decreased endurance, Decreased mobility, Difficulty walking, Impaired sensation, Decreased range of motion, Impaired perceived functional ability  Visit Diagnosis: Unsteadiness on feet  Difficulty in walking, not elsewhere classified  Muscle weakness (generalized)  Unspecified disturbances of skin sensation     Problem List Patient Active Problem List   Diagnosis Date Noted  . Facial cellulitis 05/24/2016  . Dysuria 09/07/2015  . GAD (generalized anxiety disorder) 03/27/2015  . Menometrorrhagia 08/21/2011  . Depression   . GERD (gastroesophageal reflux disease)   . History of migraine headaches   . Screening for cervical cancer 07/18/2011  . Type 2 diabetes mellitus, uncontrolled, with neuropathy ([place])     Nancy Nordmann, PT, DPT 12/02/2018, 8:30 AM  [place] [facility] [address], [place], [address] Phone: [phone]   Fax:  [phone]  Name: PERLIE STENE MRN: [id number] Date of Birth: 1970-12-24

## 2018-12-03 ENCOUNTER — Ambulatory Visit: Payer: Medicaid Other | Admitting: Physical Therapy

## 2018-12-04 ENCOUNTER — Ambulatory Visit: Payer: Medicaid Other | Admitting: Physical Therapy

## 2018-12-08 ENCOUNTER — Encounter: Payer: Self-pay | Admitting: Physical Therapy

## 2018-12-08 DIAGNOSIS — R209 Unspecified disturbances of skin sensation: Secondary | ICD-10-CM

## 2018-12-08 DIAGNOSIS — R2681 Unsteadiness on feet: Secondary | ICD-10-CM

## 2018-12-08 DIAGNOSIS — R262 Difficulty in walking, not elsewhere classified: Secondary | ICD-10-CM

## 2018-12-08 DIAGNOSIS — M6281 Muscle weakness (generalized): Secondary | ICD-10-CM

## 2018-12-08 NOTE — Therapy (Signed)
[place] [facility] [address] [place], [address] Phone: [phone]   Fax:  [phone]  Patient Details  Name: [name] MRN: [id number] Date of Birth: 03/25/1971 Referring Provider:  No ref. provider found  Encounter Date: 12/08/2018   Reason for telecommunication instead of in person visit: Clinic is closed due to precautions to prevent COVID-19 spread during the pandemic.   Called pt to update about clinic closing for in-person visits due to COVID-19 pandemic. Spoke to pt letting pt know their appointments are canceled at this time, we are monitoring the phones/voicemail, are looking into telehealth options, and would like to know if they is interested in that. Also let pt know that I am available by phone to discuss any concerns, answer questions, provide advice, modify exercise program, and problem solve remotely; that I want to and be there with them as much as possible without in-person visits. Advised pt to call if they have any PT needs or questions. Let pt know we will call back when the clinic re-opens.  Patient stated she is doing well. Last Wednesday she missed her appt due to having a low fever of 99.5-100 degrees farenheit without medication but it has gotten better and she has no difficulty breathing. States her son is getting her wii out so she can use it to practice balance. She reports she is comfortable with her HEP and does not feel she needs and further guidance on it at this point. Advised her not to use the wii without someone there monitoring her and to continue her HEP as prescribed, calling back with any questions that may come up. Let her know we will contact her when the clinic opens again.   Nancy Nordmann, PT, DPT 12/08/2018, 10:53 AM  [place] [facility] [address] [place], [address] Phone: [phone]   Fax:  [phone]

## 2018-12-15 ENCOUNTER — Ambulatory Visit: Payer: Medicaid Other | Admitting: Physical Therapy

## 2018-12-17 ENCOUNTER — Encounter: Payer: Medicaid Other | Admitting: Physical Therapy

## 2018-12-22 ENCOUNTER — Encounter: Payer: Medicaid Other | Admitting: Physical Therapy

## 2018-12-24 ENCOUNTER — Encounter: Payer: Medicaid Other | Admitting: Physical Therapy

## 2018-12-25 ENCOUNTER — Encounter: Payer: Self-pay | Admitting: Physical Therapy

## 2018-12-25 NOTE — Therapy (Unsigned)
[place] [facility] [address] [place], [place], [address] Phone: [phone]   Fax:  [phone]  Patient Details  Name: [name] MRN: [id number] Date of Birth: 03-11-1971 Referring Provider:  No ref. provider found  Encounter Date: 12/25/2018  The [facility] [place] [facility] outpatient clinics are closed at this time due to the COVID-19 epidemic  Called patient to discuss options for continued care. Suspect pt would benefit most from home health PT at this point due to balance issues, but she may also be able to benefit from telehealth if she does not feel comfortable with someone coming into her home. Patient answered. She wanted to speak to her sister before making any decisions because her sister is an integral part of her support system. Expected to call our office back Monday to let us know. I let her know I would keep her on the list for possible telehealth so someone could call her and explain that to her better. She states she is doing okay and has been practicing the exercises we did in clinic at home except for stepping over star-shaped hurdles. I reiterated to her again that I did not want her to practice that particular exercise at home due to fall risk. Patient did say she felt she needs further PT.   Everlean Alstrom. Graylon Good, PT, DPT 12/25/18, 3:10 PM   [place] [facility] [address] [place], [place], [address] Phone: [phone]   Fax:  [phone]

## 2018-12-29 ENCOUNTER — Encounter: Payer: Medicaid Other | Admitting: Physical Therapy

## 2018-12-30 NOTE — Therapy (Signed)
[place] [facility] [address] [place], [place], [address] Phone: [phone]   Fax:  [phone]  Patient Details  Name: [name] MRN: [id number] Date of Birth: [birth year] Referring Provider:  No ref. provider found  Encounter Date: 12/30/2018  The [facility] outpatient clinics are closed at this time due to the COVID-19 epidemic. The patient was contacted in regards to their therapy services. The patient is in agreement that they are safe and consent to being on hold for therapy services until the [facility] outpatient facilities reopen. At which time, the patient will be contacted to schedule an appointment to resume therapy services.     Blythe Stanford, PT DPT 12/30/2018, 1:53 PM  [place] [facility] [address] [place], [place], [address] Phone: [phone]   Fax:  [phone]

## 2018-12-31 ENCOUNTER — Encounter: Payer: Medicaid Other | Admitting: Physical Therapy

## 2019-01-05 ENCOUNTER — Encounter: Payer: Medicaid Other | Admitting: Physical Therapy

## 2019-01-07 ENCOUNTER — Encounter: Payer: Medicaid Other | Admitting: Physical Therapy

## 2019-01-12 ENCOUNTER — Encounter: Payer: Medicaid Other | Admitting: Physical Therapy

## 2019-01-14 ENCOUNTER — Encounter: Payer: Medicaid Other | Admitting: Physical Therapy

## 2019-06-04 ENCOUNTER — Encounter: Payer: Medicaid Other | Admitting: Pharmacist

## 2019-09-17 ENCOUNTER — Ambulatory Visit: Payer: Medicaid Other | Attending: Internal Medicine

## 2019-09-17 DIAGNOSIS — Z20822 Contact with and (suspected) exposure to covid-19: Secondary | ICD-10-CM

## 2019-09-20 ENCOUNTER — Telehealth: Payer: Self-pay

## 2019-09-20 NOTE — Telephone Encounter (Signed)
Pt called for covid results- advised pt that results are not back- MyChart enrollment link sent to pt.

## 2019-09-20 NOTE — Telephone Encounter (Signed)
Pt called for covid results advised that results are not back 

## 2019-09-21 ENCOUNTER — Telehealth: Payer: Self-pay | Admitting: *Deleted

## 2019-09-21 NOTE — Telephone Encounter (Signed)
Patient called for results ,still pending . 

## 2019-09-22 ENCOUNTER — Telehealth: Payer: Self-pay

## 2019-09-22 NOTE — Telephone Encounter (Signed)
Patient advise that she will need to go and recollect. Patient will reschedule online.

## 2019-09-23 LAB — NOVEL CORONAVIRUS, NAA

## 2019-10-07 ENCOUNTER — Encounter: Payer: Self-pay | Admitting: Physical Therapy

## 2019-10-07 DIAGNOSIS — M6281 Muscle weakness (generalized): Secondary | ICD-10-CM

## 2019-10-07 DIAGNOSIS — R209 Unspecified disturbances of skin sensation: Secondary | ICD-10-CM

## 2019-10-07 DIAGNOSIS — R262 Difficulty in walking, not elsewhere classified: Secondary | ICD-10-CM

## 2019-10-07 DIAGNOSIS — R2681 Unsteadiness on feet: Secondary | ICD-10-CM

## 2019-10-07 NOTE — Therapy (Signed)
[place] [facility] [address] [place], [address] Phone: [phone]   Fax:  [phone]  Physical Therapy No-Visit Discharge Reporting period: 11/18/2018 - 01/13/2019  Patient Details  Name: [name] MRN: [id number] Date of Birth: [birth year] Referring Provider (PT): Myna Bright PA   Encounter Date: 10/07/2019    Past Medical History:  Diagnosis Date  . allergic rhinitis   . Anemia    H/O  . Anxiety   . Depression   . Diabetes mellitus   . Diabetic neuropathy ([place])   . GERD (gastroesophageal reflux disease)   . Headache   . History of migraine headaches   . Neuropathy   . UTI (urinary tract infection) 06-2015    Past Surgical History:  Procedure Laterality Date  . [place]   x 2,  breech, premature 7 wks  . CHOLECYSTECTOMY N/A 07/22/2015   Procedure: LAPAROSCOPIC CHOLECYSTECTOMY ;  Surgeon: Florene Glen, MD;  Location: [facility] ORS;  Service: General;  Laterality: N/A;  . INCISION AND DRAINAGE ABSCESS Left 05/25/2016   Procedure: INCISION AND DRAINAGE ABSCESS;  Surgeon: Clayburn Pert, MD;  Location: [facility] ORS;  Service: General;  Laterality: Left;  . TONSILLECTOMY     as child  . TUBAL LIGATION  1998    There were no vitals filed for this visit.  Subjective Assessment - 10/07/19 1707    Subjective  Patient did not return to physical therapy following temporary clinic closure as a precaution for rising COVID 19 cases in the community.    Patient is accompained by:  Family member    Pertinent History  Patient is a 49 y.o. female who presents to outpatient physical therapy with a referral for medical diagnosis right foot drop. This patient's chief complaints consist of imbalance, difficulty walking, swelling, pain, and stiffness, and weaknes leading to the following functional deficits: difficulty with functional mobility including transfers, household and community  mobility, community and social participation, increased fall risk, difficulty with ADLs and IADLs, unable to work due to this problem.    How long can you sit comfortably?  < 30 min    How long can you stand comfortably?  < 5-6 min    How long can you walk comfortably?  < 20 min    Diagnostic tests  Imaging: MRI report dated 07/22/2018: "Degenerative changes result in moderate spinal canal narrowing at L3-L5, and mild to moderate neuroforaminal narrowing at L3-4" [facility]ostics report 11/26/2018 "Conclusions:  Abnormal study. There is electrodiagnostic evidence of:  1. Severe length-dependent sensorimotor axonal polyneuropathy, as  can be seen in patients with diabetes.  2. Subacute-chronic right L5 radiculopathy, with active  denervation seen in the right tibialis anterior.  3. Subacute-chronic right C8 radiculopathy, with active  denervation seen in the right first dorsal interosseous as well  as cervical paraspinals. 4. 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 Please see previous documentation for latest objective data.     PT Short Term Goals - 12/02/18 1517      PT SHORT TERM GOAL #1   Title  Be independent with initial home exercise program for self-management of symptoms.  Baseline  Initial HEP provided at initial eval (11/18/2018);     Time  2    Period  Weeks    Status  Achieved    Target Date  12/02/18        PT Long Term Goals - 10/07/19 1711      PT LONG TERM GOAL #1   Title  Be independent with a long-term home exercise program for self-management of symptoms.     Baseline  Initial HEP provided at IE (11/18/2018);     Time  8    Period  Weeks    Status  Partially Met    Target Date  01/13/19      PT LONG TERM GOAL #2   Title  Improve  Functional Gait Assessment to equal or greater than 25/30 to demonstrate low fall risk.     Baseline  11/30 (11/18/2018);     Time  8    Period  Weeks    Status  Not Met    Target Date  01/13/19      PT LONG TERM GOAL #3   Title  Improve ankle strength to be able to heel walk and toe walk bilaterally to improve ability to amblulate without loss of balance.     Baseline  Able to toe walk with minimal lift in heels bilaterally, right feels weaker, Unable to heel walk bilaterally, left feels weaker (11/18/2018);     Time  8    Period  Weeks    Status  Not Met    Target Date  01/13/19      PT LONG TERM GOAL #4   Title  Improve 5TSTS to equal or less than 12 seconds with no UE support demonstrate improved functional strenth for transfers    Baseline  5TSTS: 18.5 seconds with BUE support (11/18/2018):     Time  8    Period  Weeks    Status  Not Met    Target Date  01/13/19      PT LONG TERM GOAL #5   Title  Demonstrate bilateral hip and knee strength of equal or greater than 4+/5 bilaterally to improve functional strenth and ability to transfer and complete community mobility with decreased fall risk and improved confidence.     Baseline  see objective exam (11/18/2018);     Time  8    Period  Weeks    Status  Not Met    Target Date  01/13/19              Patient will benefit from skilled therapeutic intervention in order to improve the following deficits and impairments:     Visit Diagnosis: Unsteadiness on feet  Difficulty in walking, not elsewhere classified  Muscle weakness (generalized)  Unspecified disturbances of skin sensation     Problem List Patient Active Problem List   Diagnosis Date Noted  . Facial cellulitis 05/24/2016  . Dysuria 09/07/2015  . GAD (generalized anxiety disorder) 03/27/2015  . Menometrorrhagia 08/21/2011  . Depression   . GERD (gastroesophageal reflux disease)   . History of migraine headaches   . Screening for cervical cancer 07/18/2011   . Type 2 diabetes mellitus, uncontrolled, with neuropathy ([place])     Everlean Alstrom. Graylon Good, PT, DPT 10/07/19, 5:12 PM  [place] [facility] [address], [place], [address] Phone: [phone]   Fax:  [phone]  Name: [name] MRN: [id number] Date of Birth: 01-23-1971

## 2019-12-11 ENCOUNTER — Ambulatory Visit: Payer: Medicaid Other | Attending: Internal Medicine

## 2019-12-11 DIAGNOSIS — Z23 Encounter for immunization: Secondary | ICD-10-CM

## 2019-12-11 NOTE — Progress Notes (Signed)
   Covid-19 Vaccination Clinic  Name:  [name]    MRN: [id number] DOB: October 21, 1970  12/11/2019  [name] was observed post Covid-19 immunization for 15 minutes without incident. She was provided with Vaccine Information Sheet and instruction to access the V-Safe system.   [name] was instructed to call 911 with any severe reactions post vaccine: Marland Kitchen Difficulty breathing  . Swelling of face and throat  . A fast heartbeat  . A bad rash all over body  . Dizziness and weakness   Immunizations Administered    Name Date Dose VIS Date Route   Pfizer COVID-19 Vaccine 12/11/2019  3:40 PM 0.3 mL 08/28/2019 Intramuscular   Manufacturer: [place]   Lot: [id number]   [place]: [id number]

## 2020-01-01 ENCOUNTER — Ambulatory Visit: Payer: Medicaid Other | Attending: Internal Medicine

## 2020-01-01 DIAGNOSIS — Z23 Encounter for immunization: Secondary | ICD-10-CM

## 2020-01-01 NOTE — Progress Notes (Signed)
   Covid-19 Vaccination Clinic  Name:  [name]    MRN: [id number] DOB: October 19, 1970  01/01/2020  [name] was observed post Covid-19 immunization for 15 minutes without incident. She was provided with Vaccine Information Sheet and instruction to access the V-Safe system.   [name] was instructed to call 911 with any severe reactions post vaccine: Marland Kitchen Difficulty breathing  . Swelling of face and throat  . A fast heartbeat  . A bad rash all over body  . Dizziness and weakness   Immunizations Administered    Name Date Dose VIS Date Route   Pfizer COVID-19 Vaccine 01/01/2020  3:49 PM 0.3 mL 08/28/2019 Intramuscular   Manufacturer: [place]   Lot: [id number]   [place]: [id number]

## 2020-02-29 ENCOUNTER — Emergency Department: Payer: Medicaid Other

## 2020-02-29 ENCOUNTER — Inpatient Hospital Stay
Admission: AD | Admit: 2020-02-29 | Discharge: 2020-03-04 | DRG: 637 | Disposition: A | Discharge status: Home / Self Care | Payer: Medicaid Other | Attending: Internal Medicine | Admitting: Internal Medicine

## 2020-02-29 ENCOUNTER — Other Ambulatory Visit: Payer: Self-pay

## 2020-02-29 DIAGNOSIS — F329 Major depressive disorder, single episode, unspecified: Secondary | ICD-10-CM | POA: Diagnosis present

## 2020-02-29 DIAGNOSIS — K219 Gastro-esophageal reflux disease without esophagitis: Secondary | ICD-10-CM | POA: Diagnosis present

## 2020-02-29 DIAGNOSIS — N179 Acute kidney failure, unspecified: Secondary | ICD-10-CM

## 2020-02-29 DIAGNOSIS — D72829 Elevated white blood cell count, unspecified: Secondary | ICD-10-CM

## 2020-02-29 DIAGNOSIS — Z20822 Contact with and (suspected) exposure to covid-19: Secondary | ICD-10-CM | POA: Diagnosis present

## 2020-02-29 DIAGNOSIS — G9341 Metabolic encephalopathy: Secondary | ICD-10-CM

## 2020-02-29 DIAGNOSIS — R4182 Altered mental status, unspecified: Secondary | ICD-10-CM

## 2020-02-29 DIAGNOSIS — G43909 Migraine, unspecified, not intractable, without status migrainosus: Secondary | ICD-10-CM | POA: Diagnosis present

## 2020-02-29 DIAGNOSIS — Z823 Family history of stroke: Secondary | ICD-10-CM

## 2020-02-29 DIAGNOSIS — E876 Hypokalemia: Secondary | ICD-10-CM | POA: Diagnosis present

## 2020-02-29 DIAGNOSIS — Z7982 Long term (current) use of aspirin: Secondary | ICD-10-CM

## 2020-02-29 DIAGNOSIS — E1110 Type 2 diabetes mellitus with ketoacidosis without coma: Principal | ICD-10-CM | POA: Diagnosis present

## 2020-02-29 DIAGNOSIS — Z833 Family history of diabetes mellitus: Secondary | ICD-10-CM

## 2020-02-29 DIAGNOSIS — Z8249 Family history of ischemic heart disease and other diseases of the circulatory system: Secondary | ICD-10-CM

## 2020-02-29 DIAGNOSIS — E872 Acidosis, unspecified: Secondary | ICD-10-CM

## 2020-02-29 DIAGNOSIS — E1140 Type 2 diabetes mellitus with diabetic neuropathy, unspecified: Secondary | ICD-10-CM | POA: Diagnosis present

## 2020-02-29 DIAGNOSIS — Z83438 Family history of other disorder of lipoprotein metabolism and other lipidemia: Secondary | ICD-10-CM

## 2020-02-29 DIAGNOSIS — Z794 Long term (current) use of insulin: Secondary | ICD-10-CM

## 2020-02-29 DIAGNOSIS — I10 Essential (primary) hypertension: Secondary | ICD-10-CM | POA: Diagnosis present

## 2020-02-29 DIAGNOSIS — F419 Anxiety disorder, unspecified: Secondary | ICD-10-CM | POA: Diagnosis present

## 2020-02-29 DIAGNOSIS — Z87891 Personal history of nicotine dependence: Secondary | ICD-10-CM

## 2020-02-29 DIAGNOSIS — Z79899 Other long term (current) drug therapy: Secondary | ICD-10-CM

## 2020-02-29 LAB — CBC
HCT: 48 % — ABNORMAL HIGH (ref 36.0–46.0)
Hemoglobin: 16.8 g/dL — ABNORMAL HIGH (ref 12.0–15.0)
MCH: 31.9 pg (ref 26.0–34.0)
MCHC: 35 g/dL (ref 30.0–36.0)
MCV: 91.3 fL (ref 80.0–100.0)
Platelets: 286 10*3/uL (ref 150–400)
RBC: 5.26 MIL/uL — ABNORMAL HIGH (ref 3.87–5.11)
RDW: 12.7 % (ref 11.5–15.5)
WBC: 11.5 10*3/uL — ABNORMAL HIGH (ref 4.0–10.5)
nRBC: 0 % (ref 0.0–0.2)

## 2020-02-29 LAB — GLUCOSE, CAPILLARY: Glucose-Capillary: 494 mg/dL — ABNORMAL HIGH (ref 70–99)

## 2020-02-29 MED ORDER — LACTATED RINGERS IV BOLUS
1000.0000 mL | Freq: Once | INTRAVENOUS | Status: AC
  Administered 2020-02-29: 1000 mL via INTRAVENOUS

## 2020-02-29 NOTE — ED Triage Notes (Signed)
Pt arrives to ED via [facility] from home with c/o AMS x 3 days. Per EMS, pt with increased weakness throughout the day and intermittent confusion. Family reports headache x3 days, and the AMS started around 1030am this morning (pt couldn't stand or walk, couldn't answer questions appropriately). Family unsure if pt might have fallen. Family also states the pt reports pain "everywhere she's asked". Pt is alert to self only, which is not baseline. Pt is in NAD; RR even, regular, and unlabored.

## 2020-03-01 ENCOUNTER — Other Ambulatory Visit: Payer: Self-pay

## 2020-03-01 DIAGNOSIS — D72829 Elevated white blood cell count, unspecified: Secondary | ICD-10-CM | POA: Diagnosis present

## 2020-03-01 DIAGNOSIS — E872 Acidosis, unspecified: Secondary | ICD-10-CM

## 2020-03-01 DIAGNOSIS — Z7982 Long term (current) use of aspirin: Secondary | ICD-10-CM | POA: Diagnosis not present

## 2020-03-01 DIAGNOSIS — F329 Major depressive disorder, single episode, unspecified: Secondary | ICD-10-CM | POA: Diagnosis present

## 2020-03-01 DIAGNOSIS — Z20822 Contact with and (suspected) exposure to covid-19: Secondary | ICD-10-CM | POA: Diagnosis present

## 2020-03-01 DIAGNOSIS — K219 Gastro-esophageal reflux disease without esophagitis: Secondary | ICD-10-CM | POA: Diagnosis present

## 2020-03-01 DIAGNOSIS — Z83438 Family history of other disorder of lipoprotein metabolism and other lipidemia: Secondary | ICD-10-CM | POA: Diagnosis not present

## 2020-03-01 DIAGNOSIS — G9341 Metabolic encephalopathy: Secondary | ICD-10-CM

## 2020-03-01 DIAGNOSIS — E0810 Diabetes mellitus due to underlying condition with ketoacidosis without coma: Secondary | ICD-10-CM | POA: Diagnosis not present

## 2020-03-01 DIAGNOSIS — G43909 Migraine, unspecified, not intractable, without status migrainosus: Secondary | ICD-10-CM | POA: Diagnosis present

## 2020-03-01 DIAGNOSIS — N179 Acute kidney failure, unspecified: Secondary | ICD-10-CM

## 2020-03-01 DIAGNOSIS — Z823 Family history of stroke: Secondary | ICD-10-CM | POA: Diagnosis not present

## 2020-03-01 DIAGNOSIS — E876 Hypokalemia: Secondary | ICD-10-CM | POA: Diagnosis present

## 2020-03-01 DIAGNOSIS — Z794 Long term (current) use of insulin: Secondary | ICD-10-CM | POA: Diagnosis not present

## 2020-03-01 DIAGNOSIS — F419 Anxiety disorder, unspecified: Secondary | ICD-10-CM | POA: Diagnosis present

## 2020-03-01 DIAGNOSIS — N170 Acute kidney failure with tubular necrosis: Secondary | ICD-10-CM | POA: Diagnosis not present

## 2020-03-01 DIAGNOSIS — E1110 Type 2 diabetes mellitus with ketoacidosis without coma: Secondary | ICD-10-CM | POA: Diagnosis present

## 2020-03-01 DIAGNOSIS — Z8249 Family history of ischemic heart disease and other diseases of the circulatory system: Secondary | ICD-10-CM | POA: Diagnosis not present

## 2020-03-01 DIAGNOSIS — E1140 Type 2 diabetes mellitus with diabetic neuropathy, unspecified: Secondary | ICD-10-CM | POA: Diagnosis present

## 2020-03-01 DIAGNOSIS — Z87891 Personal history of nicotine dependence: Secondary | ICD-10-CM | POA: Diagnosis not present

## 2020-03-01 DIAGNOSIS — I10 Essential (primary) hypertension: Secondary | ICD-10-CM | POA: Diagnosis present

## 2020-03-01 DIAGNOSIS — Z79899 Other long term (current) drug therapy: Secondary | ICD-10-CM | POA: Diagnosis not present

## 2020-03-01 DIAGNOSIS — D72828 Other elevated white blood cell count: Secondary | ICD-10-CM | POA: Diagnosis not present

## 2020-03-01 DIAGNOSIS — Z833 Family history of diabetes mellitus: Secondary | ICD-10-CM | POA: Diagnosis not present

## 2020-03-01 LAB — BASIC METABOLIC PANEL
Anion gap: 14 (ref 5–15)
Anion gap: 11 (ref 5–15)
Anion gap: 7 (ref 5–15)
BUN: 29 mg/dL — ABNORMAL HIGH (ref 6–20)
BUN: 24 mg/dL — ABNORMAL HIGH (ref 6–20)
BUN: 23 mg/dL — ABNORMAL HIGH (ref 6–20)
CO2: 18 mmol/L — ABNORMAL LOW (ref 22–32)
CO2: 19 mmol/L — ABNORMAL LOW (ref 22–32)
CO2: 22 mmol/L (ref 22–32)
Calcium: 9.2 mg/dL (ref 8.9–10.3)
Calcium: 8.8 mg/dL — ABNORMAL LOW (ref 8.9–10.3)
Calcium: 8.9 mg/dL (ref 8.9–10.3)
Chloride: 105 mmol/L (ref 98–111)
Chloride: 104 mmol/L (ref 98–111)
Chloride: 106 mmol/L (ref 98–111)
Creatinine, Ser: 0.83 mg/dL (ref 0.44–1.00)
Creatinine, Ser: 0.73 mg/dL (ref 0.44–1.00)
Creatinine, Ser: 0.54 mg/dL (ref 0.44–1.00)
GFR calc Af Amer: >60 mL/min (ref >60)
GFR calc Af Amer: >60 mL/min (ref >60)
GFR calc Af Amer: >60 mL/min (ref >60)
GFR calc non Af Amer: >60 mL/min (ref >60)
GFR calc non Af Amer: >60 mL/min (ref >60)
GFR calc non Af Amer: >60 mL/min (ref >60)
Glucose, Bld: 194 mg/dL — ABNORMAL HIGH (ref 70–99)
Glucose, Bld: 206 mg/dL — ABNORMAL HIGH (ref 70–99)
Glucose, Bld: 160 mg/dL — ABNORMAL HIGH (ref 70–99)
Potassium: 4.3 mmol/L (ref 3.5–5.1)
Potassium: 4.9 mmol/L (ref 3.5–5.1)
Potassium: 4.3 mmol/L (ref 3.5–5.1)
Sodium: 137 mmol/L (ref 135–145)
Sodium: 134 mmol/L — ABNORMAL LOW (ref 135–145)
Sodium: 135 mmol/L (ref 135–145)

## 2020-03-01 LAB — BETA-HYDROXYBUTYRIC ACID
Beta-Hydroxybutyric Acid: 3.28 mmol/L — ABNORMAL HIGH (ref 0.05–0.27)
Beta-Hydroxybutyric Acid: 2.46 mmol/L — ABNORMAL HIGH (ref 0.05–0.27)
Beta-Hydroxybutyric Acid: 0.29 mmol/L — ABNORMAL HIGH (ref 0.05–0.27)

## 2020-03-01 LAB — BASIC METABOLIC PANEL WITH GFR
Anion gap: 16 — ABNORMAL HIGH (ref 5–15)
Anion gap: 8 (ref 5–15)
BUN: 31 mg/dL — ABNORMAL HIGH (ref 6–20)
BUN: 25 mg/dL — ABNORMAL HIGH (ref 6–20)
CO2: 18 mmol/L — ABNORMAL LOW (ref 22–32)
CO2: 20 mmol/L — ABNORMAL LOW (ref 22–32)
Calcium: 9.8 mg/dL (ref 8.9–10.3)
Calcium: 8.8 mg/dL — ABNORMAL LOW (ref 8.9–10.3)
Chloride: 102 mmol/L (ref 98–111)
Chloride: 106 mmol/L (ref 98–111)
Creatinine, Ser: 1.1 mg/dL — ABNORMAL HIGH (ref 0.44–1.00)
Creatinine, Ser: 0.65 mg/dL (ref 0.44–1.00)
GFR calc Af Amer: >60 mL/min
GFR calc Af Amer: >60 mL/min
GFR calc non Af Amer: 58 mL/min — ABNORMAL LOW
GFR calc non Af Amer: >60 mL/min
Glucose, Bld: 321 mg/dL — ABNORMAL HIGH (ref 70–99)
Glucose, Bld: 181 mg/dL — ABNORMAL HIGH (ref 70–99)
Potassium: 4.3 mmol/L (ref 3.5–5.1)
Potassium: 4.5 mmol/L (ref 3.5–5.1)
Sodium: 136 mmol/L (ref 135–145)
Sodium: 134 mmol/L — ABNORMAL LOW (ref 135–145)

## 2020-03-01 LAB — URINE DRUG SCREEN, QUALITATIVE (ARMC ONLY)
Amphetamines, Ur Screen: NOT DETECTED
Barbiturates, Ur Screen: NOT DETECTED
Benzodiazepine, Ur Scrn: NOT DETECTED
Cannabinoid 50 Ng, Ur ~~LOC~~: POSITIVE — AB
Cocaine Metabolite,Ur ~~LOC~~: NOT DETECTED
MDMA (Ecstasy)Ur Screen: NOT DETECTED
Methadone Scn, Ur: NOT DETECTED
Opiate, Ur Screen: NOT DETECTED
Phencyclidine (PCP) Ur S: NOT DETECTED
Tricyclic, Ur Screen: NOT DETECTED

## 2020-03-01 LAB — COMPREHENSIVE METABOLIC PANEL
ALT: 13 U/L (ref 0–44)
AST: 12 U/L — ABNORMAL LOW (ref 15–41)
Albumin: 4.8 g/dL (ref 3.5–5.0)
Alkaline Phosphatase: 59 U/L (ref 38–126)
Anion gap: 24 — ABNORMAL HIGH (ref 5–15)
BUN: 33 mg/dL — ABNORMAL HIGH (ref 6–20)
CO2: 14 mmol/L — ABNORMAL LOW (ref 22–32)
Calcium: 10.2 mg/dL (ref 8.9–10.3)
Chloride: 96 mmol/L — ABNORMAL LOW (ref 98–111)
Creatinine, Ser: 1.17 mg/dL — ABNORMAL HIGH (ref 0.44–1.00)
GFR calc Af Amer: >60 mL/min (ref >60)
GFR calc non Af Amer: 54 mL/min — ABNORMAL LOW (ref >60)
Glucose, Bld: 492 mg/dL — ABNORMAL HIGH (ref 70–99)
Potassium: 5 mmol/L (ref 3.5–5.1)
Sodium: 134 mmol/L — ABNORMAL LOW (ref 135–145)
Total Bilirubin: 2 mg/dL — ABNORMAL HIGH (ref 0.3–1.2)
Total Protein: 9.2 g/dL — ABNORMAL HIGH (ref 6.5–8.1)

## 2020-03-01 LAB — CBC WITH DIFFERENTIAL/PLATELET
Abs Immature Granulocytes: 0.03 10*3/uL (ref 0.00–0.07)
Basophils Absolute: 0 10*3/uL (ref 0.0–0.1)
Basophils Relative: 0 %
Eosinophils Absolute: 0 10*3/uL (ref 0.0–0.5)
Eosinophils Relative: 0 %
HCT: 37.9 % (ref 36.0–46.0)
Hemoglobin: 14.2 g/dL (ref 12.0–15.0)
Immature Granulocytes: 0 %
Lymphocytes Relative: 24 %
Lymphs Abs: 2.8 10*3/uL (ref 0.7–4.0)
MCH: 32.9 pg (ref 26.0–34.0)
MCHC: 37.5 g/dL — ABNORMAL HIGH (ref 30.0–36.0)
MCV: 87.7 fL (ref 80.0–100.0)
Monocytes Absolute: 1.6 10*3/uL — ABNORMAL HIGH (ref 0.1–1.0)
Monocytes Relative: 14 %
Neutro Abs: 7 10*3/uL (ref 1.7–7.7)
Neutrophils Relative %: 62 %
Platelets: 202 10*3/uL (ref 150–400)
RBC: 4.32 MIL/uL (ref 3.87–5.11)
RDW: 12.4 % (ref 11.5–15.5)
WBC: 11.4 10*3/uL — ABNORMAL HIGH (ref 4.0–10.5)
nRBC: 0 % (ref 0.0–0.2)

## 2020-03-01 LAB — GLUCOSE, CAPILLARY
Glucose-Capillary: 146 mg/dL — ABNORMAL HIGH (ref 70–99)
Glucose-Capillary: 146 mg/dL — ABNORMAL HIGH (ref 70–99)
Glucose-Capillary: 163 mg/dL — ABNORMAL HIGH (ref 70–99)
Glucose-Capillary: 176 mg/dL — ABNORMAL HIGH (ref 70–99)
Glucose-Capillary: 180 mg/dL — ABNORMAL HIGH (ref 70–99)
Glucose-Capillary: 182 mg/dL — ABNORMAL HIGH (ref 70–99)
Glucose-Capillary: 183 mg/dL — ABNORMAL HIGH (ref 70–99)
Glucose-Capillary: 183 mg/dL — ABNORMAL HIGH (ref 70–99)
Glucose-Capillary: 187 mg/dL — ABNORMAL HIGH (ref 70–99)
Glucose-Capillary: 191 mg/dL — ABNORMAL HIGH (ref 70–99)
Glucose-Capillary: 196 mg/dL — ABNORMAL HIGH (ref 70–99)
Glucose-Capillary: 197 mg/dL — ABNORMAL HIGH (ref 70–99)
Glucose-Capillary: 199 mg/dL — ABNORMAL HIGH (ref 70–99)
Glucose-Capillary: 200 mg/dL — ABNORMAL HIGH (ref 70–99)
Glucose-Capillary: 210 mg/dL — ABNORMAL HIGH (ref 70–99)
Glucose-Capillary: 243 mg/dL — ABNORMAL HIGH (ref 70–99)
Glucose-Capillary: 253 mg/dL — ABNORMAL HIGH (ref 70–99)
Glucose-Capillary: 289 mg/dL — ABNORMAL HIGH (ref 70–99)

## 2020-03-01 LAB — LACTIC ACID, PLASMA
Lactic Acid, Venous: 2.4 mmol/L (ref 0.5–1.9)
Lactic Acid, Venous: 2.2 mmol/L (ref 0.5–1.9)

## 2020-03-01 LAB — URINALYSIS, COMPLETE (UACMP) WITH MICROSCOPIC
Bacteria, UA: NONE SEEN
Bilirubin Urine: NEGATIVE
Glucose, UA: >=500 mg/dL — AB
Ketones, ur: 80 mg/dL — AB
Leukocytes,Ua: NEGATIVE
Nitrite: NEGATIVE
Protein, ur: 30 mg/dL — AB
Specific Gravity, Urine: 1.03 (ref 1.005–1.030)
pH: 5 (ref 5.0–8.0)

## 2020-03-01 LAB — ETHANOL: Alcohol, Ethyl (B): <10 mg/dL (ref <10)

## 2020-03-01 LAB — MAGNESIUM
Magnesium: 1.6 mg/dL — ABNORMAL LOW (ref 1.7–2.4)
Magnesium: 1.5 mg/dL — ABNORMAL LOW (ref 1.7–2.4)
Magnesium: 3.1 mg/dL — ABNORMAL HIGH (ref 1.7–2.4)

## 2020-03-01 LAB — HIV ANTIBODY (ROUTINE TESTING W REFLEX): HIV Screen 4th Generation wRfx: NONREACTIVE

## 2020-03-01 LAB — BLOOD GAS, VENOUS
Acid-base deficit: 10.5 mmol/L — ABNORMAL HIGH (ref 0.0–2.0)
Bicarbonate: 16.8 mmol/L — ABNORMAL LOW (ref 20.0–28.0)
O2 Saturation: 43 %
Patient temperature: 37
pCO2, Ven: 41 mmHg — ABNORMAL LOW (ref 44.0–60.0)
pH, Ven: 7.22 — ABNORMAL LOW (ref 7.250–7.430)
pO2, Ven: <31 mmHg — CL (ref 32.0–45.0)

## 2020-03-01 LAB — MRSA PCR SCREENING: MRSA by PCR: NEGATIVE

## 2020-03-01 LAB — TROPONIN I (HIGH SENSITIVITY): Troponin I (High Sensitivity): 7 ng/L (ref <18)

## 2020-03-01 LAB — PROCALCITONIN: Procalcitonin: <0.1 ng/mL

## 2020-03-01 LAB — PHOSPHORUS: Phosphorus: 2.9 mg/dL (ref 2.5–4.6)

## 2020-03-01 LAB — SARS CORONAVIRUS 2 BY RT PCR (HOSPITAL ORDER, PERFORMED IN ~~LOC~~ HOSPITAL LAB): SARS Coronavirus 2: NEGATIVE

## 2020-03-01 LAB — AMMONIA: Ammonia: 14 umol/L (ref 9–35)

## 2020-03-01 MED ORDER — POTASSIUM CHLORIDE 10 MEQ/100ML IV SOLN
10.0000 meq | INTRAVENOUS | Status: AC
  Administered 2020-03-01 (×2): 10 meq via INTRAVENOUS
  Filled 2020-03-01 (×2): qty 100

## 2020-03-01 MED ORDER — MAGNESIUM SULFATE 4 GM/100ML IV SOLN
4.0000 g | Freq: Once | INTRAVENOUS | Status: AC
  Administered 2020-03-01: 4 g via INTRAVENOUS
  Filled 2020-03-01: qty 100

## 2020-03-01 MED ORDER — DEXTROSE-NACL 5-0.45 % IV SOLN: INTRAVENOUS | Status: DC

## 2020-03-01 MED ORDER — SODIUM CHLORIDE 0.9 % IV SOLN
INTRAVENOUS | Status: DC | PRN
  Administered 2020-03-01: 250 mL via INTRAVENOUS

## 2020-03-01 MED ORDER — DEXTROSE 50 % IV SOLN: 0.0000 mL | INTRAVENOUS | Status: DC | PRN

## 2020-03-01 MED ORDER — DEXAMETHASONE SODIUM PHOSPHATE 10 MG/ML IJ SOLN
10.0000 mg | Freq: Four times a day (QID) | INTRAMUSCULAR | Status: DC
  Administered 2020-03-01 (×2): 10 mg via INTRAVENOUS
  Filled 2020-03-01 (×4): qty 1

## 2020-03-01 MED ORDER — SODIUM CHLORIDE 0.9 % IV SOLN: 2.0000 g | INTRAVENOUS | Status: DC

## 2020-03-01 MED ORDER — VANCOMYCIN HCL 1750 MG/350ML IV SOLN
1750.0000 mg | Freq: Once | INTRAVENOUS | Status: AC
  Administered 2020-03-01: 1750 mg via INTRAVENOUS
  Filled 2020-03-01: qty 350

## 2020-03-01 MED ORDER — DEXMEDETOMIDINE HCL IN NACL 400 MCG/100ML IV SOLN
0.4000 ug/kg/h | INTRAVENOUS | Status: DC
  Administered 2020-03-01 (×2): 0.4 ug/kg/h via INTRAVENOUS
  Filled 2020-03-01: qty 100

## 2020-03-01 MED ORDER — CHLORHEXIDINE GLUCONATE CLOTH 2 % EX PADS
6.0000 | MEDICATED_PAD | Freq: Every day | CUTANEOUS | Status: DC
  Administered 2020-03-02: 6 via TOPICAL

## 2020-03-01 MED ORDER — INSULIN REGULAR(HUMAN) IN NACL 100-0.9 UT/100ML-% IV SOLN
INTRAVENOUS | Status: DC
  Administered 2020-03-01: 11.5 [IU]/h via INTRAVENOUS
  Filled 2020-03-01: qty 100

## 2020-03-01 MED ORDER — POLYETHYLENE GLYCOL 3350 17 G PO PACK: 17.0000 g | PACK | Freq: Every day | ORAL | Status: DC | PRN

## 2020-03-01 MED ORDER — ONDANSETRON HCL 4 MG/2ML IJ SOLN
4.0000 mg | Freq: Four times a day (QID) | INTRAMUSCULAR | Status: DC | PRN
  Administered 2020-03-04: 4 mg via INTRAVENOUS
  Filled 2020-03-01: qty 2

## 2020-03-01 MED ORDER — VANCOMYCIN HCL 750 MG/150ML IV SOLN
750.0000 mg | Freq: Two times a day (BID) | INTRAVENOUS | Status: DC
  Administered 2020-03-01: 750 mg via INTRAVENOUS
  Filled 2020-03-01 (×3): qty 150

## 2020-03-01 MED ORDER — INSULIN ASPART 100 UNIT/ML ~~LOC~~ SOLN
0.0000 [IU] | Freq: Three times a day (TID) | SUBCUTANEOUS | Status: DC
  Administered 2020-03-02: 3 [IU] via SUBCUTANEOUS
  Administered 2020-03-02 – 2020-03-03 (×2): 2 [IU] via SUBCUTANEOUS
  Administered 2020-03-03: 3 [IU] via SUBCUTANEOUS
  Administered 2020-03-04: 2 [IU] via SUBCUTANEOUS
  Filled 2020-03-01 (×5): qty 1

## 2020-03-01 MED ORDER — LACTATED RINGERS IV BOLUS
1000.0000 mL | INTRAVENOUS | Status: AC
  Administered 2020-03-01: 1000 mL via INTRAVENOUS

## 2020-03-01 MED ORDER — LORAZEPAM 2 MG/ML IJ SOLN
1.0000 mg | Freq: Once | INTRAMUSCULAR | Status: AC
  Administered 2020-03-01: 1 mg via INTRAVENOUS
  Filled 2020-03-01: qty 1

## 2020-03-01 MED ORDER — FAMOTIDINE IN NACL 20-0.9 MG/50ML-% IV SOLN
20.0000 mg | INTRAVENOUS | Status: DC
  Administered 2020-03-01 – 2020-03-03 (×3): 20 mg via INTRAVENOUS
  Filled 2020-03-01 (×3): qty 50

## 2020-03-01 MED ORDER — DOCUSATE SODIUM 100 MG PO CAPS: 100.0000 mg | ORAL_CAPSULE | Freq: Two times a day (BID) | ORAL | Status: DC | PRN

## 2020-03-01 MED ORDER — INSULIN ASPART 100 UNIT/ML ~~LOC~~ SOLN: 0.0000 [IU] | Freq: Every day | SUBCUTANEOUS | Status: DC

## 2020-03-01 MED ORDER — SODIUM CHLORIDE 0.9 % IV SOLN: INTRAVENOUS | Status: DC

## 2020-03-01 MED ORDER — ENOXAPARIN SODIUM 40 MG/0.4ML ~~LOC~~ SOLN: 40.0000 mg | SUBCUTANEOUS | Status: DC

## 2020-03-01 MED ORDER — DEXTROSE 5 % IV SOLN
10.0000 mg/kg | Freq: Three times a day (TID) | INTRAVENOUS | Status: DC
  Administered 2020-03-01 – 2020-03-02 (×4): 680 mg via INTRAVENOUS
  Filled 2020-03-01 (×5): qty 13.6

## 2020-03-01 MED ORDER — SODIUM CHLORIDE 0.9 % IV SOLN
2.0000 g | Freq: Two times a day (BID) | INTRAVENOUS | Status: DC
  Administered 2020-03-01 – 2020-03-02 (×3): 2 g via INTRAVENOUS
  Filled 2020-03-01: qty 20
  Filled 2020-03-01: qty 2
  Filled 2020-03-01 (×2): qty 20

## 2020-03-01 MED ORDER — INSULIN DETEMIR 100 UNIT/ML ~~LOC~~ SOLN
33.0000 [IU] | Freq: Two times a day (BID) | SUBCUTANEOUS | Status: DC
  Administered 2020-03-01 – 2020-03-02 (×3): 33 [IU] via SUBCUTANEOUS
  Filled 2020-03-01 (×5): qty 0.33

## 2020-03-01 NOTE — ED Provider Notes (Signed)
[facility] Emergency Department Provider Note  ____________________________________________  Time seen: Approximately 12:15 AM  I have reviewed the triage vital signs and the nursing notes.   HISTORY  Chief Complaint Altered Mental Status  Level 5 caveat:  Portions of the history and physical were unable to be obtained due to encephalopathy   HPI [name] is a 49 y.o. female with a history of diabetes, migraine headaches, depression, anxiety, anemia who presents with her sister for altered mental status.  Patient unable to provide any history due to encephalopathy.  History is gathered from her sister.  Sister reports the patient was complaining of one of her migraines for the last 2 days.  This morning she was at baseline when her son left for work.  This afternoon when he returned she was extremely confused and encephalopathic.  She was not complaining of anything else.  She has had no fever.  Her last Covid shot was a month ago.  No vomiting or diarrhea, no cough.   When asked her name patient responds "[name]." Any other question after that patient responds "[name]." When asked about pain she shakes her head no. She is able to recognize her sister and say her name.  Past Medical History:  Diagnosis Date  . allergic rhinitis   . Anemia    H/O  . Anxiety   . Depression   . Diabetes mellitus   . Diabetic neuropathy ([place])   . GERD (gastroesophageal reflux disease)   . Headache   . History of migraine headaches   . Neuropathy   . UTI (urinary tract infection) 06-2015    Patient Active Problem List   Diagnosis Date Noted  . Facial cellulitis 05/24/2016  . Dysuria 09/07/2015  . GAD (generalized anxiety disorder) 03/27/2015  . Menometrorrhagia 08/21/2011  . Depression   . GERD (gastroesophageal reflux disease)   . History of migraine headaches   . Screening for cervical cancer 07/18/2011  . Type 2 diabetes mellitus, uncontrolled, with  neuropathy ([place])     Past Surgical History:  Procedure Laterality Date  . [place]   x 2,  breech, premature 7 wks  . CHOLECYSTECTOMY N/A 07/22/2015   Procedure: LAPAROSCOPIC CHOLECYSTECTOMY ;  Surgeon: Florene Glen, MD;  Location: [facility] ORS;  Service: General;  Laterality: N/A;  . INCISION AND DRAINAGE ABSCESS Left 05/25/2016   Procedure: INCISION AND DRAINAGE ABSCESS;  Surgeon: Clayburn Pert, MD;  Location: [facility] ORS;  Service: General;  Laterality: Left;  . TONSILLECTOMY     as child  . TUBAL LIGATION  1998    Prior to Admission medications   Medication Sig Start Date End Date Taking? Authorizing Provider  atorvastatin (LIPITOR) 10 MG tablet Take 10 mg by mouth daily.    [provider]  DULoxetine (CYMBALTA) 60 MG capsule Take 30 mg by mouth daily.     [provider]  furosemide (LASIX) 20 MG tablet Take 20 mg by mouth 2 (two) times daily.    [provider]  gabapentin (NEURONTIN) 400 MG capsule Take 400 mg by mouth 3 (three) times daily.    [provider]  glucose blood test strip Use as instructed up to 4 times daily 04/14/13   Crecencio Mc, MD  hydrOXYzine (ATARAX/VISTARIL) 25 MG tablet Take 25 mg by mouth.    [provider]  INS SYRINGE/NEEDLE 1CC/28G (B-D INSULIN SYRINGE 1CC/28G) 28G X 1/2" 1 ML MISC 1 Syringe by Does  not apply route daily after supper. 03/17/15   Crecencio Mc, MD  insulin aspart (NOVOLOG) 100 UNIT/ML injection Inject 10-15 Units into the skin 3 (three) times daily with meals. Take 10-15 units based on blood sugar level.    [provider]  insulin detemir (LEVEMIR) 100 UNIT/ML injection Inject 60-80 Units into the skin 2 (two) times daily. Take 80 units in the morning and 60 units at night.     [provider]  Lancets MISC Patient test blood sugar two times daily. 08/24/11   Crecencio Mc, MD  lisinopril (PRINIVIL,ZESTRIL) 2.5 MG tablet Take 2.5 mg by mouth daily.     [provider]  metformin (FORTAMET) 500 MG (OSM) 24 hr tablet Take 500 mg by mouth 2 (two) times daily.    [provider]  omeprazole (PRILOSEC) 40 MG capsule Take 1 capsule (40 mg total) by mouth daily. 04/18/17 04/18/18  Nena Polio, MD  pregabalin (LYRICA) 50 MG capsule Take 50 mg by mouth 3 (three) times daily.    [provider]    Allergies Patient has no known allergies.  Family History  Problem Relation Age of Onset  . Hypertension Mother   . Hypothyroidism Mother   . Thrombocytopenia Mother   . Heart disease Father   . Hyperlipidemia Father   . Stroke Father   . Diabetes Father   . Hypertension Father   . Breast cancer Neg Hx     Social History Social History   Tobacco Use  . Smoking status: Former Smoker    Packs/day: 0.25    Years: 6.00    Pack years: 1.50    Types: Cigarettes  . Smokeless tobacco: Never Used  Substance Use Topics  . Alcohol use: No  . Drug use: No    Review of Systems  Constitutional: Negative for fever. + AMS Neurological: + HA  ____________________________________________   PHYSICAL EXAM:  VITAL SIGNS: ED Triage Vitals  Enc Vitals Group     BP 02/29/20 2103 110/77     Pulse Rate 02/29/20 2103 (!) 103     Resp 02/29/20 2103 17     Temp 02/29/20 2103 98 F (36.7 C)     Temp Source 02/29/20 2103 Oral     SpO2 02/29/20 2058 97 %     Weight 02/29/20 2105 150 lb (68 kg)     Height 02/29/20 2105 5\' 5"  (1.651 m)     Head Circumference --      Peak Flow --      Pain Score --      Pain Loc --      Pain Edu? --      Excl. in [place]? --     Constitutional: Awake, oriented to self only HEENT:      Head: Normocephalic and atraumatic.         Eyes: Conjunctivae are normal. Sclera is non-icteric. PERRL      Mouth/Throat: Mucous membranes are dry      Neck: Supple with no signs of meningismus. Cardiovascular: Tachycardic with regular rhythm. Respiratory: Normal respiratory effort. Lungs are clear to  auscultation bilaterally. No wheezes, crackles, or rhonchi.  Gastrointestinal: Soft, non tender. Musculoskeletal: No edema, cyanosis, or erythema of extremities. Neurologic: Normal speech. Face is symmetric. Moving all extremities. No gross focal neurologic deficits are appreciated. Skin: Skin is warm, dry and intact. No rash noted.   ____________________________________________   LABS (all labs ordered are listed, but only abnormal results are displayed)  Labs Reviewed  CBC - Abnormal; Notable for the following components:      Result Value   WBC 11.5 (*)    RBC 5.26 (*)    Hemoglobin 16.8 (*)    HCT 48.0 (*)    All other components within normal limits  COMPREHENSIVE METABOLIC PANEL - Abnormal; Notable for the following components:   Sodium 134 (*)    Chloride 96 (*)    CO2 14 (*)    Glucose, Bld 492 (*)    BUN 33 (*)    Creatinine, Ser 1.17 (*)    Total Protein 9.2 (*)    AST 12 (*)    Total Bilirubin 2.0 (*)    GFR calc non Af Amer 54 (*)    Anion gap 24 (*)    All other components within normal limits  GLUCOSE, CAPILLARY - Abnormal; Notable for the following components:   Glucose-Capillary 494 (*)    All other components within normal limits  LACTIC ACID, PLASMA - Abnormal; Notable for the following components:   Lactic Acid, Venous 2.4 (*)    All other components within normal limits  BLOOD GAS, VENOUS - Abnormal; Notable for the following components:   pH, Ven 7.22 (*)    pCO2, Ven 41 (*)    pO2, Ven <31.0 (*)    Bicarbonate 16.8 (*)    Acid-base deficit 10.5 (*)    All other components within normal limits  URINE DRUG SCREEN, QUALITATIVE ([facility] ONLY) - Abnormal; Notable for the following components:   Cannabinoid 50 Ng, Ur [place] POSITIVE (*)    All other components within normal limits  URINALYSIS, COMPLETE (UACMP) WITH MICROSCOPIC - Abnormal; Notable for the following components:   Color, Urine STRAW (*)    APPearance CLEAR (*)    Glucose, UA >=500 (*)    Hgb  urine dipstick SMALL (*)    Ketones, ur 80 (*)    Protein, ur 30 (*)    All other components within normal limits  SARS CORONAVIRUS 2 BY RT PCR (HOSPITAL ORDER, [place] [facility])  AMMONIA  ETHANOL  LACTIC ACID, PLASMA  BETA-HYDROXYBUTYRIC ACID  BETA-HYDROXYBUTYRIC ACID  BETA-HYDROXYBUTYRIC ACID  POC URINE PREG, ED  CBG MONITORING, ED  TROPONIN I (HIGH SENSITIVITY)   ____________________________________________  EKG  ED ECG REPORT I, Rudene Re, the attending physician, personally viewed and interpreted this ECG.   Sinus tachycardia, rate of 106, normal intervals, normal axis, no ST elevations or depressions. ____________________________________________  RADIOLOGY  I have personally reviewed the images performed during this visit and I agree with the Radiologist's read.   Interpretation by Radiologist:  DG Chest 2 View  Result Date: 02/29/2020 CLINICAL DATA:  Altered mental status. EXAM: CHEST - 2 VIEW COMPARISON:  01/30/2018 FINDINGS: The heart size and mediastinal contours are within normal limits. Both lungs are clear. The visualized skeletal structures are unremarkable. IMPRESSION: No active cardiopulmonary disease. Electronically Signed   By: Kerby Moors M.D.   On: 02/29/2020 21:37   CT Head Wo Contrast  Result Date: 02/29/2020 CLINICAL DATA:  TIA. Altered mental status for 3 days. Progressive weakness. Confusion. Headache. EXAM: CT HEAD WITHOUT CONTRAST TECHNIQUE: Contiguous axial images were obtained from the base of the skull through the vertex without intravenous contrast. COMPARISON:  Head CT 01/30/2014 FINDINGS: Brain: Brain volume is normal for age. No intracranial hemorrhage, mass effect, or midline shift. No hydrocephalus. The basilar cisterns are patent. No evidence of territorial infarct or acute ischemia. No  extra-axial or intracranial fluid collection. Vascular: No hyperdense vessel or unexpected calcification. Skull: No fracture or  focal lesion. Sinuses/Orbits: Scattered mucosal thickening of ethmoid air cells. No sinus fluid levels. Mastoid air cells are clear. Included orbits are unremarkable. Other: None. IMPRESSION: 1. No acute intracranial abnormality. 2. Mild ethmoid sinus mucosal thickening. Electronically Signed   By: Keith Rake M.D.   On: 02/29/2020 21:48     ____________________________________________   PROCEDURES  Procedure(s) performed:yes .1-3 Lead EKG Interpretation Performed by: Rudene Re, MD Authorized by: Rudene Re, MD     Interpretation: normal     ECG rate assessment: tachycardic     Rhythm: sinus tachycardia     Ectopy: none     Critical Care performed: yes  CRITICAL CARE Performed by: Rudene Re  ?  Total critical care time: 45 min  Critical care time was exclusive of separately billable procedures and treating other patients.  Critical care was necessary to treat or prevent imminent or life-threatening deterioration.  Critical care was time spent personally by me on the following activities: development of treatment plan with patient and/or surrogate as well as nursing, discussions with consultants, evaluation of patient's response to treatment, examination of patient, obtaining history from patient or surrogate, ordering and performing treatments and interventions, ordering and review of laboratory studies, ordering and review of radiographic studies, pulse oximetry and re-evaluation of patient's condition.  ____________________________________________   INITIAL IMPRESSION / ASSESSMENT AND PLAN / ED COURSE  49 y.o. female with a history of diabetes, migraine headaches, depression, anxiety, anemia who presents with her sister for altered mental status.  Patient looks chronically ill, extremely dry, very confused.  No meningeal signs, no rash, no fever.  No signs of trauma on exam.  Differential diagnosis is broad and includes DKA versus sepsis versus  stroke versus head bleed versus meningitis versus hepatic encephalopathy versus encephalitis.  History and plan discussed with sister who is at bedside.  Head CT visualized by me with no signs of blood or acute abnormalities, confirmed by radiology.  CMP consistent with DKA and acute kidney injury with a glucose of 492, anion gap of 24.  Will start IV fluids and insulin drip.  Ammonia and VBG are pending.  _________________________ 1:17 AM on 03/01/2020 ----------------------------------------- pH of 7.22.  Ammonia is negative.  Patient is receiving fluids and started on insulin drip.  Will consult hospitalist for admission.  Patient is full code.  Plan discussed with patient and her sister who was at bedside.  Old medical records reviewed.  Patient on telemetry for close monitoring.       _____________________________________________ Please note:  Patient was evaluated in Emergency Department today for the symptoms described in the history of present illness. Patient was evaluated in the context of the global COVID-19 pandemic, which necessitated consideration that the patient might be at risk for infection with the SARS-CoV-2 virus that causes COVID-19. Institutional protocols and algorithms that pertain to the evaluation of patients at risk for COVID-19 are in a state of rapid change based on information released by regulatory bodies including the CDC and federal and state organizations. These policies and algorithms were followed during the patient's care in the ED.  Some ED evaluations and interventions may be delayed as a result of limited staffing during the pandemic.   [place] Controlled Substance Database was reviewed by me. ____________________________________________   FINAL CLINICAL IMPRESSION(S) / ED DIAGNOSES   Final diagnoses:  Diabetic ketoacidosis without coma associated with type 2 diabetes mellitus (  [place])  Altered mental status, unspecified altered mental status type  AKI  (acute kidney injury) ([place])      NEW MEDICATIONS STARTED DURING THIS VISIT:  ED Discharge Orders    None       Note:  This document was prepared using Dragon voice recognition software and may include unintentional dictation errors.    Alfred Levins, [place], MD 03/01/20 [phone]

## 2020-03-01 NOTE — Progress Notes (Signed)
Inpatient Diabetes Program Recommendations  AACE/ADA: New Consensus Statement on Inpatient Glycemic Control   Target Ranges:  Prepandial:   less than 140 mg/dL      Peak postprandial:   less than 180 mg/dL (1-2 hours)      Critically ill patients:  140 - 180 mg/dL   Results for [name], [name] (MRN [id number]) as of 03/01/2020 06:54  Ref. Range 02/29/2020 23:33 03/01/2020 02:09 03/01/2020 03:29 03/01/2020 04:38 03/01/2020 05:43 03/01/2020 06:51  Glucose-Capillary Latest Ref Range: 70 - 99 mg/dL 494 (H) 289 (H) 253 (H) 199 (H) 191 (H) 183 (H)  Results for [name], [name] (MRN [id number]) as of 03/01/2020 06:54  Ref. Range 05/24/2016 12:00  Hemoglobin A1C Latest Ref Range: 4.0 - 6.0 % 13.2 (H)   Review of Glycemic Control  Diabetes history: DM2 Outpatient Diabetes medications: Levemir 80 units QAM, Levemir 60 units QHS, Novolog 10-15 units TID with meals Current orders for Inpatient glycemic control: IV insulin, Decadron 10 mg Q6H  Inpatient Diabetes Program Recommendations:    Insulin-Noted Decadron just ordered this morning and patient was given first dose at 6:37 am today. If steroids are continued, would recommend continuing IV insulin for now (even if acidosis is cleared) to keep glucose controlled and to help determine insulin needs.  HbgA1C: Please consider ordering an A1C to evaluate glycemic control over the past 2-3 months.  NOTE: Per chart, patient has DM2 hx and takes Levemir and Novolog as noted above for DM management. Patient admitted with DKA and has altered mental status and confusion for 3 days (per H&P). Initial glucose 492 mg/dl on 02/29/20 at 23:09 and patient was started on IV insulin. Current glucose down to 183 mg/dl at 6:51 am. Noted patient was given Decadron 10 mg at 6:37 am and is ordered to receive Decadron every 6 hours. If steroids will be continued, would recommend IV insulin be continued even once acidosis is cleared.   Thanks, Barnie Alderman, RN, MSN, CDE Diabetes  Coordinator Inpatient Diabetes Program [phone] (Team Pager from 8am to 5pm)

## 2020-03-01 NOTE — ED Notes (Signed)
Pt becoming irritable and wanting to get out of bed. Sitter at bedside. Mittens placed on pt. MD informed and request for medication.

## 2020-03-01 NOTE — ED Notes (Signed)
Admit MD Kasa messaged to see if he is the MD caring for this pt.

## 2020-03-01 NOTE — ED Notes (Signed)
Abby from Pharmacy will send up medication for pt

## 2020-03-01 NOTE — ED Notes (Signed)
Provided pt with warm blankets at this time. Family at bedside. No other needs noted at this time.

## 2020-03-01 NOTE — H&P (Addendum)
Name: [name] MRN: [id number] DOB: 01/07/1971    ADMISSION DATE:  02/29/2020  REFERRING MD : Dr. Alfred Levins   CHIEF COMPLAINT: Altered Mental Status   BRIEF PATIENT DESCRIPTION:  49 yo female admitted with acute metabolic encephalopathy secondary to DKA requiring insulin gtt   SIGNIFICANT EVENTS/STUDIES:  06/14: CT Head revealed no acute intracranial abnormality. Mild ethmoid sinus mucosal thickening 06/15: Pt admitted to ICU with acute metabolic encephalopathy secondary to DKA requiring insulin gtt   HISTORY OF PRESENT ILLNESS:   This is a 49 yo female with a PMH of UTI, Neuropathy, Migraine Headaches, GERD, Diabetic Neuropathy, Type II Diabetes Mellitus, Depression, Anxiety, Anemia, and Allergic Rhinitis.  She presented to [facility] ER on 06/14 via EMS from home with altered mental status onset 3 days prior to ER presentation.  EMS reported the pts family stated the pt had worsening weakness and confusion throughout the day on 06/14.  They also reported the pt was unable to stand/walk and couldn't answer questions appropriately, but they were unsure if the pt had fallen.  In the ER pt remained confused. ER labs were: Na+ 134, chloride 96, CO2 14, glucose 492, BUN 33, creatinine 1.17, anion gap 24, lactic acid 2.4, wbc 11.5, and vbg pH 7.22/pCO2 41/acid-base deficit 10.5/bicarb 16.8.  Pt ruled in for DKA 2L LR bolus and insulin gtt initiated.  CT Head negative for acute intracranial abnormality.  PCCM contacted for ICU admission.    PAST MEDICAL HISTORY :   has a past medical history of allergic rhinitis, Anemia, Anxiety, Depression, Diabetes mellitus, Diabetic neuropathy ([place]), GERD (gastroesophageal reflux disease), Headache, History of migraine headaches, Neuropathy, and UTI (urinary tract infection) (06-2015).  has a past surgical history that includes Tonsillectomy; Cholecystectomy (N/A, 07/22/2015); Cesarean section (1995 and 1998); Tubal ligation (1998); and Incision and drainage abscess  (Left, 05/25/2016). Prior to Admission medications   Medication Sig Start Date End Date Taking? Authorizing Provider  aspirin 81 MG EC tablet Take 81 mg by mouth daily.    [provider]  atorvastatin (LIPITOR) 10 MG tablet Take 10 mg by mouth daily.    [provider]  b complex vitamins tablet Take 1 tablet by mouth daily.    [provider]  DULoxetine (CYMBALTA) 60 MG capsule Take 30 mg by mouth daily.     [provider]  furosemide (LASIX) 20 MG tablet Take 20 mg by mouth 2 (two) times daily.    [provider]  gabapentin (NEURONTIN) 400 MG capsule Take 400 mg by mouth 3 (three) times daily.    [provider]  glucose blood test strip Use as instructed up to 4 times daily 04/14/13   Crecencio Mc, MD  hydrOXYzine (ATARAX/VISTARIL) 25 MG tablet Take 25 mg by mouth.    [provider]  INS SYRINGE/NEEDLE 1CC/28G (B-D INSULIN SYRINGE 1CC/28G) 28G X 1/2" 1 ML MISC 1 Syringe by Does not apply route daily after supper. 03/17/15   Crecencio Mc, MD  insulin aspart (NOVOLOG) 100 UNIT/ML injection Inject 10-15 Units into the skin 3 (three) times daily with meals. Take 10-15 units based on blood sugar level.    [provider]  insulin detemir (LEVEMIR) 100 UNIT/ML injection Inject 60-80 Units into the skin 2 (two) times daily. Take 80 units in the morning and 60 units at night.     [provider]  Lancets MISC Patient test blood sugar two times daily. 08/24/11   Crecencio Mc, MD  lisinopril (PRINIVIL,ZESTRIL) 2.5 MG tablet Take 2.5 mg by mouth daily.    [provider]  metformin (FORTAMET) 500 MG (OSM) 24 hr tablet Take 500 mg by mouth 2 (two) times daily.    [provider]  omeprazole (PRILOSEC) 40 MG capsule Take 1 capsule (40 mg total) by mouth daily. 04/18/17 04/18/18  Nena Polio, MD  pregabalin (LYRICA) 50 MG capsule Take 50 mg by mouth 3 (three) times daily.    [provider]    traZODone (DESYREL) 50 MG tablet Take 50-100 mg by mouth at bedtime.    [provider]   No Known Allergies  FAMILY HISTORY:  family history includes Diabetes in her father; Heart disease in her father; Hyperlipidemia in her father; Hypertension in her father and mother; Hypothyroidism in her mother; Stroke in her father; Thrombocytopenia in her mother. SOCIAL HISTORY:  reports that she has quit smoking. Her smoking use included cigarettes. She has a 1.50 pack-year smoking history. She has never used smokeless tobacco. She reports that she does not drink alcohol and does not use drugs.  REVIEW OF SYSTEMS:   Unable to assess pt confused   SUBJECTIVE:  Unable to assess pt confused   VITAL SIGNS: Temp:  [98 F (36.7 C)] 98 F (36.7 C) (06/14 2103) Pulse Rate:  [103] 103 (06/14 2103) Resp:  [17] 17 (06/14 2103) BP: (110)/(77) 110/77 (06/14 2103) SpO2:  [97 %-100 %] 100 % (06/14 2103) Weight:  [68 kg] 68 kg (06/14 2105)  PHYSICAL EXAMINATION: General: acutely ill appearing female, NAD in no acute distress Neuro: more alert and awake,  HEENT: supple, no JVD  Cardiovascular: nsr, rrr, no R/G  Lungs: diminished throughout, even, non labored  Abdomen: +BS x4, soft, non distended Musculoskeletal: normal bulk and tone, no edema  Skin: intact no rashes or lesions present   Recent Labs  Lab 02/29/20 2309  NA 134*  K 5.0  CL 96*  CO2 14*  BUN 33*  CREATININE 1.17*  GLUCOSE 492*   Recent Labs  Lab 02/29/20 2144  HGB 16.8*  HCT 48.0*  WBC 11.5*  PLT 286   DG Chest 2 View  Result Date: 02/29/2020 CLINICAL DATA:  Altered mental status. EXAM: CHEST - 2 VIEW COMPARISON:  01/30/2018 FINDINGS: The heart size and mediastinal contours are within normal limits. Both lungs are clear. The visualized skeletal structures are unremarkable. IMPRESSION: No active cardiopulmonary disease. Electronically Signed   By: Kerby Moors M.D.   On: 02/29/2020 21:37   CT Head Wo  Contrast  Result Date: 02/29/2020 CLINICAL DATA:  TIA. Altered mental status for 3 days. Progressive weakness. Confusion. Headache. EXAM: CT HEAD WITHOUT CONTRAST TECHNIQUE: Contiguous axial images were obtained from the base of the skull through the vertex without intravenous contrast. COMPARISON:  Head CT 01/30/2014 FINDINGS: Brain: Brain volume is normal for age. No intracranial hemorrhage, mass effect, or midline shift. No hydrocephalus. The basilar cisterns are patent. No evidence of territorial infarct or acute ischemia. No extra-axial or intracranial fluid collection. Vascular: No hyperdense vessel or unexpected calcification. Skull: No fracture or focal lesion. Sinuses/Orbits: Scattered mucosal thickening of ethmoid air cells. No sinus fluid levels. Mastoid air cells are clear. Included orbits are unremarkable. Other: None. IMPRESSION: 1. No acute intracranial abnormality. 2. Mild ethmoid sinus mucosal thickening. Electronically Signed   By: Keith Rake M.D.   On: 02/29/2020 21:48    ASSESSMENT / PLAN:  Diabetic ketoacidosis  Acute metabolic encephalopathy  Mild acute renal failure  with metabolic and lactic acidosis in the setting of DKA  Mild leukocytosis  Hx: GERD, Anxiety, Depression, Anemia, and Neuropathy  Prn supplemental O2 for dyspnea and/or hypoxia  Continuous telemetry monitoring  Continue insulin gtt until anion gap closed and serum CO2 >20  BMP q4hrs while on insulin gtt  Replace electrolytes as indicated  Monitor UOP Avoid nephrotoxic medications  Trend WBC and monitor fever curve  Trend PCT and lactic acid  Follow cultures   -No family currently present at bedside to update at this time.    Marda Stalker, [place] Pager [phone] (please enter 7 digits) [place] Pager [phone] (please enter 7 digits)   PCCM ATTENDING ATTESTATION:  I have evaluated patient with the APP, I personally  reviewed database in its entirety and  discussed care plan in detail. In addition, this patient was discussed on multidisciplinary rounds.   I agree with assessment and plan.  Important exam findings: More calm on precedex Responds to voice  Wean off ABX  and steroids Wean off insulin as tolerated   SD status for now.   Corrin Parker, M.D.  [name]  Medical Director [place] Director [facility] Cardio-Pulmonary Department

## 2020-03-01 NOTE — ED Notes (Signed)
Pt bladder scan by Sitter and scan revealed >1000, in and out performed and pt's bladder to be drained.  Sitter only able to drain about 367mL.  Will message Admit MD to see what next steps are and to update on these new findings.

## 2020-03-01 NOTE — Progress Notes (Signed)
PHARMACY CONSULT NOTE - FOLLOW UP  Pharmacy Consult for Electrolyte Monitoring and Replacement   Recent Labs: Potassium (mmol/L)  Date Value  03/01/2020 4.3  09/29/2014 3.6   Magnesium (mg/dL)  Date Value  03/01/2020 3.1 (H)   Calcium (mg/dL)  Date Value  03/01/2020 8.9   Calcium, Total (mg/dL)  Date Value  09/29/2014 8.6   Albumin (g/dL)  Date Value  02/29/2020 4.8  06/18/2013 3.3 (L)   Phosphorus (mg/dL)  Date Value  03/01/2020 2.9   Sodium (mmol/L)  Date Value  03/01/2020 135  09/29/2014 132 (L)     Assessment: Electrolytes @ 2000 WNL   Goal of Therapy:  Electrolytes WNL   Plan:  No electrolyte supplementation needed currently.  Will recheck electrolytes on 6/16 with AM labs.   Orene Desanctis ,PharmD Clinical Pharmacist 03/01/2020 8:54 PM

## 2020-03-01 NOTE — ED Notes (Signed)
Pt not being able to be easily redirected. Pt yelling she needs to get up and pee. Pt instructed to stay in bed and that she has external cath so she can pee. Pt still trying to get out of bed.  Sitter at bedside.

## 2020-03-01 NOTE — Progress Notes (Addendum)
Pharmacy Antiviral/antibiotic Note  [name] is a 49 y.o. 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  Pharmacy has been consulted for acyclovir/vancomycin dosing.  Plan: Will bolus patient w/ vancomycin 1.75g IV load in ED (25 mg/kg TBW). Will plan to start vancomycin 750 mg IV q12h w/ trough check 06/17 @ 0200 prior to 4th dose.  Ke 0.0474 T1/2 14 ~ 12 hrs and decrease maintenance dose to 750 mg from 1g (15 mg/kg).  Will start patient on acyclovir 10 mg/kg TBW given patient is not more than 120% her TBW.  Will start acyclovir 680 mg IV q8h per total body weight for CrCl > 50 ml/min. Will continue to monitor clinical status and changes in renal function, and CBC and will adjust doses per changes in renal function.  Height: 5\' 5"  (165.1 cm) Weight: 68 kg (150 lb) IBW/kg (Calculated) : 57  Temp (24hrs), Avg:98 F (36.7 C), Min:98 F (36.7 C), Max:98 F (36.7 C)  Recent Labs  Lab 02/29/20 2144 02/29/20 2309  WBC 11.5*  --   CREATININE  --  1.17*  LATICACIDVEN  --  2.4*    Estimated Creatinine Clearance: 51.8 mL/min (A) (by C-G formula based on SCr of 1.17 mg/dL (H)).    No Known Allergies  Thank you for allowing pharmacy to be a part of this patient's care.  Tobie Lords, PharmD, BCPS Clinical Pharmacist 03/01/2020 3:03 AM

## 2020-03-01 NOTE — ED Notes (Signed)
MD Kasa messaged and transition orders to be placed.

## 2020-03-01 NOTE — ED Notes (Signed)
Verbal order for foley to be placed per MD [facility]

## 2020-03-01 NOTE — ED Notes (Signed)
Pt starting to try and get out of bed. Sitter at bedside. Will message Admit MD to inform and see about any new orders.

## 2020-03-01 NOTE — ED Notes (Signed)
Pt cleaned up and new gown and brief placed. Pt pulled up in bed. Pt still trying to get out of bed. Trying to reorient pt with no success.pt still confused and pulling at cords and IVs. Medication increased at this time.

## 2020-03-01 NOTE — ED Notes (Signed)
Family at bedside trying to redirect pt.

## 2020-03-01 NOTE — ED Notes (Signed)
New orders for BMP to be collected at noon per Admit MD [facility]

## 2020-03-02 DIAGNOSIS — N170 Acute kidney failure with tubular necrosis: Secondary | ICD-10-CM

## 2020-03-02 LAB — CBC WITH DIFFERENTIAL/PLATELET
Abs Immature Granulocytes: 0.05 10*3/uL (ref 0.00–0.07)
Basophils Absolute: 0 10*3/uL (ref 0.0–0.1)
Basophils Relative: 0 %
Eosinophils Absolute: 0 10*3/uL (ref 0.0–0.5)
Eosinophils Relative: 0 %
HCT: 36.6 % (ref 36.0–46.0)
Hemoglobin: 13.1 g/dL (ref 12.0–15.0)
Immature Granulocytes: 1 %
Lymphocytes Relative: 20 %
Lymphs Abs: 1.5 10*3/uL (ref 0.7–4.0)
MCH: 32 pg (ref 26.0–34.0)
MCHC: 35.8 g/dL (ref 30.0–36.0)
MCV: 89.3 fL (ref 80.0–100.0)
Monocytes Absolute: 0.6 10*3/uL (ref 0.1–1.0)
Monocytes Relative: 8 %
Neutro Abs: 5.4 10*3/uL (ref 1.7–7.7)
Neutrophils Relative %: 71 %
Platelets: 160 10*3/uL (ref 150–400)
RBC: 4.1 MIL/uL (ref 3.87–5.11)
RDW: 12 % (ref 11.5–15.5)
WBC: 7.6 10*3/uL (ref 4.0–10.5)
nRBC: 0 % (ref 0.0–0.2)

## 2020-03-02 LAB — BASIC METABOLIC PANEL
Anion gap: 10 (ref 5–15)
BUN: 23 mg/dL — ABNORMAL HIGH (ref 6–20)
CO2: 20 mmol/L — ABNORMAL LOW (ref 22–32)
Calcium: 8.6 mg/dL — ABNORMAL LOW (ref 8.9–10.3)
Chloride: 106 mmol/L (ref 98–111)
Creatinine, Ser: 0.61 mg/dL (ref 0.44–1.00)
GFR calc Af Amer: >60 mL/min (ref >60)
GFR calc non Af Amer: >60 mL/min (ref >60)
Glucose, Bld: 189 mg/dL — ABNORMAL HIGH (ref 70–99)
Potassium: 4 mmol/L (ref 3.5–5.1)
Sodium: 136 mmol/L (ref 135–145)

## 2020-03-02 LAB — GLUCOSE, CAPILLARY
Glucose-Capillary: 109 mg/dL — ABNORMAL HIGH (ref 70–99)
Glucose-Capillary: 132 mg/dL — ABNORMAL HIGH (ref 70–99)
Glucose-Capillary: 159 mg/dL — ABNORMAL HIGH (ref 70–99)
Glucose-Capillary: 165 mg/dL — ABNORMAL HIGH (ref 70–99)
Glucose-Capillary: 168 mg/dL — ABNORMAL HIGH (ref 70–99)
Glucose-Capillary: 91 mg/dL (ref 70–99)

## 2020-03-02 LAB — MAGNESIUM: Magnesium: 2.1 mg/dL (ref 1.7–2.4)

## 2020-03-02 MED ORDER — ASPIRIN EC 81 MG PO TBEC
81.0000 mg | DELAYED_RELEASE_TABLET | Freq: Every day | ORAL | Status: DC
  Administered 2020-03-02 – 2020-03-04 (×3): 81 mg via ORAL
  Filled 2020-03-02 (×3): qty 1

## 2020-03-02 MED ORDER — DULOXETINE HCL 30 MG PO CPEP
30.0000 mg | ORAL_CAPSULE | Freq: Every day | ORAL | Status: DC
  Administered 2020-03-02 – 2020-03-03 (×2): 30 mg via ORAL
  Filled 2020-03-02 (×2): qty 1

## 2020-03-02 MED ORDER — LISINOPRIL 2.5 MG PO TABS
2.5000 mg | ORAL_TABLET | Freq: Every day | ORAL | Status: DC
  Administered 2020-03-02 – 2020-03-04 (×3): 2.5 mg via ORAL
  Filled 2020-03-02 (×3): qty 1

## 2020-03-02 MED ORDER — GABAPENTIN 400 MG PO CAPS
400.0000 mg | ORAL_CAPSULE | Freq: Three times a day (TID) | ORAL | Status: DC
  Administered 2020-03-02 – 2020-03-04 (×6): 400 mg via ORAL
  Filled 2020-03-02 (×6): qty 1

## 2020-03-02 MED ORDER — DOCUSATE SODIUM 100 MG PO CAPS
100.0000 mg | ORAL_CAPSULE | Freq: Two times a day (BID) | ORAL | Status: DC
  Administered 2020-03-03: 100 mg via ORAL
  Filled 2020-03-02 (×2): qty 1

## 2020-03-02 MED ORDER — ATORVASTATIN CALCIUM 10 MG PO TABS
10.0000 mg | ORAL_TABLET | Freq: Every day | ORAL | Status: DC
  Administered 2020-03-02 – 2020-03-04 (×3): 10 mg via ORAL
  Filled 2020-03-02 (×3): qty 1

## 2020-03-02 NOTE — Progress Notes (Signed)
49 y/o F w/ poorly controlled DM2 who presented w/ altered mental status & DKA and initially admitted to the ICU. Pt is off of insulin drip and on SQ insulin. Vitals are stable currently. Hospitalist service will take over care tomorrow 03/03/20. This is a non-billable note

## 2020-03-02 NOTE — Progress Notes (Addendum)
Inpatient Diabetes Program Recommendations  AACE/ADA: New Consensus Statement on Inpatient Glycemic Control   Target Ranges:  Prepandial:   less than 140 mg/dL      Peak postprandial:   less than 180 mg/dL (1-2 hours)      Critically ill patients:  140 - 180 mg/dL  Results for [name], [name] (MRN [id number]) as of 03/02/2020 08:38  Ref. Range 03/02/2020 00:56 03/02/2020 07:25  Glucose-Capillary Latest Ref Range: 70 - 99 mg/dL 159 (H) 168 (H)  Novolog 3 units   Results for [name] (MRN [id number]) as of 03/02/2020 08:38  Ref. Range 03/01/2020 08:01 03/01/2020 09:09 03/01/2020 10:08 03/01/2020 11:33 03/01/2020 12:30 03/01/2020 13:41 03/01/2020 15:03 03/01/2020 16:02 03/01/2020 17:18 03/01/2020 19:27 03/01/2020 20:28 03/01/2020 21:35 03/01/2020 23:36  Glucose-Capillary Latest Ref Range: 70 - 99 mg/dL 210 (H) 196 (H) 182 (H) 200 (H) 243 (H) 197 (H) 187 (H) 183 (H) 180 (H) 146 (H) 146 (H) 176 (H) 163 (H)  Levemir 33 units @23 :06   Review of Glycemic Control  Diabetes history: DM2 Outpatient Diabetes medications: Levemir 50 units BID, Novolog 3-15 units TID with meals, Metformin 1000 mg BID, Jardiance 10 mg daily Current orders for Inpatient glycemic control: Levemir 33 units BID, Novolog 0-15 units TID with meals, Novolog 0-5 units QHS  Inpatient Diabetes Program Recommendations:    HbgA1C: Please consider ordering an A1C to evaluate glycemic control over the past 2-3 months.  Outpatient DM medications: Since patient recently started taking Jardiance and presented in DKA, may want to have patient stop Jardiance at time of discharge and follow up with PCP regarding DM control.  Addendum 03/02/20@12 :05-Spoke with patient about diabetes and home regimen for diabetes control. Patient reports being followed by PCP at [facility] for diabetes management and currently taking Levemir 50 units BID, Novolog 3-15 units TID with meals, Metformin 1000 mg BID, Jardiance daily. Patient reports that she was  prescribed another DM medication that was an injectable medication (not insulin) but she had persistent nausea and vomiting with the medication so PCP had to stop taking it and added Jardiance which she started taking on Tuesday 02/23/20. Patient reports that only side effect she experienced from the Jardiance was dizziness.  Patient reports that she consistently takes DM medications and rarely forgets about her medications. Patient does admit that this past Sunday and Monday she felt so bad she is not sure if she took any DM medications at all. Patient reports that she checks glucose frequently at home and it is usually 150-250 mg/dl.  Patient states that her last A1C was in the 11% range (which was down from prior A1C).  Informed patient that an A1C has been ordered but no resulted yet. Encouraged patient to ask about A1C if she is not informed about results prior to discharge.  Discussed glucose and A1C goals. Discussed importance of checking CBGs and maintaining good CBG control to prevent long-term and short-term complications. Explained how hyperglycemia leads to damage within blood vessels which lead to the common complications seen with uncontrolled diabetes. Stressed to the patient the importance of improving glycemic control to prevent further complications from uncontrolled diabetes. Discussed impact of nutrition, exercise, stress, sickness, and medications on diabetes control.  Patient states that she frequently has nausea and vomiting in the mornings. Questioned about any history of gastroparesis but patient states she has never been told anything about gastroparesis. Patient notes that she does have neuropathy in her legs and feet and takes medication for it. Patient  also admits that she has depression and sometimes she feels depressed despite taking Cymbalta. Provided emotional support.   Encouraged patient to talk with PCP about frequent nausea and vomiting in the mornings and depression. Encouraged  patient to take DM medications consistently, check glucose 3-4 times a day, keep a log of glucose readings or take glucometer to appointments, and follow up with PCP regarding DM control. Patient verbalized understanding of information discussed and reports no further questions at this time related to diabetes.  Thanks, Barnie Alderman, RN, MSN, CDE Diabetes Coordinator Inpatient Diabetes Program [phone] (Team Pager from 8am to 5pm)

## 2020-03-02 NOTE — Progress Notes (Signed)
CHIEF COMPLAINT:   Chief Complaint  Patient presents with  . Altered Mental Status    Subjective  MS staus improved Off precedex DKA resolved AKI improved  BMP Latest Ref Rng & Units 03/02/2020 03/01/2020 03/01/2020  Glucose 70 - 99 mg/dL 189(H) 160(H) 181(H)  BUN 6 - 20 mg/dL 23(H) 23(H) 25(H)  Creatinine 0.44 - 1.00 mg/dL 0.61 0.54 0.65  Sodium 135 - 145 mmol/L 136 135 134(L)  Potassium 3.5 - 5.1 mmol/L 4.0 4.3 4.5  Chloride 98 - 111 mmol/L 106 106 106  CO2 22 - 32 mmol/L 20(L) 22 20(L)  Calcium 8.9 - 10.3 mg/dL 8.6(L) 8.9 8.8(L)    Review of Systems:  Gen:  Denies  fever, sweats, chills weight loss  HEENT: Denies blurred vision, double vision, ear pain, eye pain, hearing loss, nose bleeds, sore throat Cardiac:  No dizziness, chest pain or heaviness, chest tightness,edema, No JVD Resp:   No cough, -sputum production, -shortness of breath,-wheezing, -hemoptysis,  Gi: Denies swallowing difficulty, stomach pain, nausea or vomiting, diarrhea, constipation, bowel incontinence Gu:  Denies bladder incontinence, burning urine Ext:   Denies Joint pain, stiffness or swelling Skin: Denies  skin rash, easy bruising or bleeding or hives Endoc:  Denies polyuria, polydipsia , polyphagia or weight change Psych:   Denies depression, insomnia or hallucinations  Other:  All other systems negative      Objective   Examination:  General exam: Appears calm and comfortable  Respiratory system: Clear to auscultation. Respiratory effort normal. HEENT: [place]/AT, PERRLA, no thrush, no stridor. Cardiovascular system: S1 & S2 heard, RRR. No JVD, murmurs, rubs, gallops or clicks. No pedal edema. Gastrointestinal system: Abdomen is nondistended, soft and nontender. No organomegaly or masses felt. Normal bowel sounds heard. Central nervous system: Alert and oriented. No focal neurological deficits. Extremities: Symmetric 5 x 5 power. Skin: No rashes, lesions or ulcers Psychiatry: Judgement and  insight appear normal. Mood & affect appropriate.   VITALS:  height is 5\' 5"  (1.651 m) and weight is 68.1 kg. Her oral temperature is 98 F (36.7 C). Her blood pressure is 130/87 and her pulse is 71. Her respiration is 16 and oxygen saturation is 100%.   I personally reviewed Labs under Results section.  Radiology Reports DG Chest 2 View  Result Date: 02/29/2020 CLINICAL DATA:  Altered mental status. EXAM: CHEST - 2 VIEW COMPARISON:  01/30/2018 FINDINGS: The heart size and mediastinal contours are within normal limits. Both lungs are clear. The visualized skeletal structures are unremarkable. IMPRESSION: No active cardiopulmonary disease. Electronically Signed   By: Kerby Moors M.D.   On: 02/29/2020 21:37   CT Head Wo Contrast  Result Date: 02/29/2020 CLINICAL DATA:  TIA. Altered mental status for 3 days. Progressive weakness. Confusion. Headache. EXAM: CT HEAD WITHOUT CONTRAST TECHNIQUE: Contiguous axial images were obtained from the base of the skull through the vertex without intravenous contrast. COMPARISON:  Head CT 01/30/2014 FINDINGS: Brain: Brain volume is normal for age. No intracranial hemorrhage, mass effect, or midline shift. No hydrocephalus. The basilar cisterns are patent. No evidence of territorial infarct or acute ischemia. No extra-axial or intracranial fluid collection. Vascular: No hyperdense vessel or unexpected calcification. Skull: No fracture or focal lesion. Sinuses/Orbits: Scattered mucosal thickening of ethmoid air cells. No sinus fluid levels. Mastoid air cells are clear. Included orbits are unremarkable. Other: None. IMPRESSION: 1. No acute intracranial abnormality. 2. Mild ethmoid sinus mucosal thickening. Electronically Signed   By: Keith Rake M.D.   On: 02/29/2020 21:48  Assessment/Plan:  DKA resolved AGITATION RESOLVED TRANSFER TO MED SURG TRANSFER TO [facility] 6/17   Corrin Parker, M.D.  [facility]  Medical  Director [place] Director [facility] Cardio-Pulmonary Department

## 2020-03-02 NOTE — Progress Notes (Signed)
PHARMACY CONSULT NOTE - FOLLOW UP  Pharmacy Consult for Electrolyte Monitoring and Replacement   Recent Labs: Potassium (mmol/L)  Date Value  03/02/2020 4.0  09/29/2014 3.6   Magnesium (mg/dL)  Date Value  03/01/2020 3.1 (H)   Calcium (mg/dL)  Date Value  03/02/2020 8.6 (L)   Calcium, Total (mg/dL)  Date Value  09/29/2014 8.6   Albumin (g/dL)  Date Value  02/29/2020 4.8  06/18/2013 3.3 (L)   Phosphorus (mg/dL)  Date Value  03/01/2020 2.9   Sodium (mmol/L)  Date Value  03/02/2020 136  09/29/2014 132 (L)   Add-On magnesium: 2.1 mg/dL  Assessment: 49 y/o F w/ poorly controlled DM2 who presented w/ altered mental status & DKA and initially admitted to the ICU. Pt is off of insulin drip and on SQ insulin  Goal of Therapy:  Electrolytes WNL   Plan:   No electrolyte supplementation warranted for today   recheck electrolytes on 6/17 with AM labs.   Dallie Piles ,PharmD Clinical Pharmacist 03/02/2020 7:02 AM

## 2020-03-02 NOTE — Progress Notes (Signed)
Patient waiting for bed on floor.  She is AOx4.  Vitals WDL.  She consumed her meals roughly 70%.  Sister visited this shift.  She has recovered well and eager to go home.

## 2020-03-03 LAB — CBC WITH DIFFERENTIAL/PLATELET
Abs Immature Granulocytes: 0.01 10*3/uL (ref 0.00–0.07)
Basophils Absolute: 0 10*3/uL (ref 0.0–0.1)
Basophils Relative: 1 %
Eosinophils Absolute: 0 10*3/uL (ref 0.0–0.5)
Eosinophils Relative: 1 %
HCT: 36.9 % (ref 36.0–46.0)
Hemoglobin: 13.2 g/dL (ref 12.0–15.0)
Immature Granulocytes: 0 %
Lymphocytes Relative: 42 %
Lymphs Abs: 2.7 10*3/uL (ref 0.7–4.0)
MCH: 32.1 pg (ref 26.0–34.0)
MCHC: 35.8 g/dL (ref 30.0–36.0)
MCV: 89.8 fL (ref 80.0–100.0)
Monocytes Absolute: 0.6 10*3/uL (ref 0.1–1.0)
Monocytes Relative: 10 %
Neutro Abs: 3.1 10*3/uL (ref 1.7–7.7)
Neutrophils Relative %: 46 %
Platelets: 169 10*3/uL (ref 150–400)
RBC: 4.11 MIL/uL (ref 3.87–5.11)
RDW: 12.2 % (ref 11.5–15.5)
WBC: 6.5 10*3/uL (ref 4.0–10.5)
nRBC: 0 % (ref 0.0–0.2)

## 2020-03-03 LAB — GLUCOSE, CAPILLARY
Glucose-Capillary: 75 mg/dL (ref 70–99)
Glucose-Capillary: 173 mg/dL — ABNORMAL HIGH (ref 70–99)
Glucose-Capillary: 150 mg/dL — ABNORMAL HIGH (ref 70–99)
Glucose-Capillary: 126 mg/dL — ABNORMAL HIGH (ref 70–99)

## 2020-03-03 LAB — BASIC METABOLIC PANEL
Anion gap: 8 (ref 5–15)
BUN: 14 mg/dL (ref 6–20)
CO2: 23 mmol/L (ref 22–32)
Calcium: 8.7 mg/dL — ABNORMAL LOW (ref 8.9–10.3)
Chloride: 108 mmol/L (ref 98–111)
Creatinine, Ser: 0.56 mg/dL (ref 0.44–1.00)
GFR calc Af Amer: >60 mL/min (ref >60)
GFR calc non Af Amer: >60 mL/min (ref >60)
Glucose, Bld: 130 mg/dL — ABNORMAL HIGH (ref 70–99)
Potassium: 2.8 mmol/L — ABNORMAL LOW (ref 3.5–5.1)
Sodium: 139 mmol/L (ref 135–145)

## 2020-03-03 LAB — HEMOGLOBIN A1C
Hgb A1c MFr Bld: 11.1 % — ABNORMAL HIGH (ref 4.8–5.6)
Mean Plasma Glucose: 272 mg/dL

## 2020-03-03 LAB — MAGNESIUM: Magnesium: 1.8 mg/dL (ref 1.7–2.4)

## 2020-03-03 MED ORDER — POTASSIUM CHLORIDE CRYS ER 20 MEQ PO TBCR
40.0000 meq | EXTENDED_RELEASE_TABLET | Freq: Four times a day (QID) | ORAL | Status: AC
  Administered 2020-03-03 (×2): 40 meq via ORAL
  Filled 2020-03-03 (×2): qty 2

## 2020-03-03 MED ORDER — BUTALBITAL-APAP-CAFFEINE 50-325-40 MG PO TABS
1.0000 | ORAL_TABLET | Freq: Four times a day (QID) | ORAL | Status: DC | PRN
  Administered 2020-03-03: 1 via ORAL
  Filled 2020-03-03 (×2): qty 1

## 2020-03-03 MED ORDER — DOCUSATE SODIUM 100 MG PO CAPS
200.0000 mg | ORAL_CAPSULE | Freq: Two times a day (BID) | ORAL | Status: DC
  Administered 2020-03-03 – 2020-03-04 (×2): 200 mg via ORAL
  Filled 2020-03-03 (×2): qty 2

## 2020-03-03 MED ORDER — POLYETHYLENE GLYCOL 3350 17 G PO PACK
17.0000 g | PACK | Freq: Every day | ORAL | Status: DC
  Administered 2020-03-03: 17 g via ORAL
  Filled 2020-03-03: qty 1

## 2020-03-03 MED ORDER — ENOXAPARIN SODIUM 40 MG/0.4ML ~~LOC~~ SOLN
40.0000 mg | SUBCUTANEOUS | Status: DC
  Administered 2020-03-03: 40 mg via SUBCUTANEOUS
  Filled 2020-03-03: qty 0.4

## 2020-03-03 MED ORDER — FAMOTIDINE 20 MG PO TABS
20.0000 mg | ORAL_TABLET | Freq: Two times a day (BID) | ORAL | Status: DC
  Administered 2020-03-04: 20 mg via ORAL
  Filled 2020-03-03: qty 1

## 2020-03-03 MED ORDER — INSULIN DETEMIR 100 UNIT/ML ~~LOC~~ SOLN
30.0000 [IU] | Freq: Two times a day (BID) | SUBCUTANEOUS | Status: DC
  Administered 2020-03-03 (×2): 30 [IU] via SUBCUTANEOUS
  Filled 2020-03-03 (×5): qty 0.3

## 2020-03-03 NOTE — Plan of Care (Signed)
Pt has been transferred from ICU. No other issues noted.

## 2020-03-03 NOTE — Progress Notes (Signed)
OT Cancellation Note  Patient Details Name: [name] MRN: [id number] DOB: 1971/05/27   Cancelled Treatment:    Reason Eval/Treat Not Completed: Medical issues which prohibited therapy  OT consult received and chart reviewed. Upon chart review this AM, pt was noted to have critically low K+ at 2.8 which falls outside guidelines for participation with therapy. Will hold at this time and f/u at later date/time as pt becomes more appropriate. Thank you.  Gerrianne Scale, [place], OTR/L ascom [phone] 03/03/20, 8:38 AM

## 2020-03-03 NOTE — Progress Notes (Signed)
Patient maintains AOx4.  Vitals WDL.  She has called out for standby assistance while she ambulates to the toilet and back to bed.  Sister visited bedside.  Ambulated in the [place] around nursing unit while pushing her IV pole.  Consumed 70% of meals.  UOP occurences x 3.  No B./M but delivered mirilax.  Patient transferred to room 228.

## 2020-03-03 NOTE — Progress Notes (Signed)
OT Cancellation Note  Patient Details Name: [name] MRN: [id number] DOB: 11-04-1970   Cancelled Treatment:    Reason Eval/Treat Not Completed: OT screened, no needs identified, will sign off  OT presents for evaluation while pt is with PT. Pt performing fxl mobility w/o AD with CGA and assist for line management.  OT screens once pt is back to room and pt demos appropriate strength, ROM and safety awareness to perform self care and ADL mobility adequately. Reports she feels she is at her functional baseline. Will complete OT order at this time as no acute OT needs identified. Do not anticipate need for OT follow up upon discharge. Thank you.  [name], [place], OTR/L ascom [phone] 03/03/20, 3:39 PM

## 2020-03-03 NOTE — Progress Notes (Signed)
PROGRESS NOTE    [name]  ZOX:[id number] DOB: 12/07/1970 DOA: 02/29/2020 PCP: [facility] [place]:   Active Problems:   DKA (diabetic ketoacidoses) (HCC)   Leukocytosis   Acute metabolic encephalopathy   Lactic acidosis   Acute renal failure (ARF) (HCC)   DKA: weaned of insulin drip. Continue on levemir, SSI w/ accuchecks. DM coordinator following. Recommending pt see an endocrinologist an outpatient.   Acute metabolic encephalopathy: likely secondary to above. Resolved  DM2: poorly controlled. HbA1c 11.1. Continue on levemir, SSI w/ accuchecks. DM coordinator following  HTN: will continue on lisinopril   Depression: severity unknown. Continue on home dose of duloxetine  Peripheral neuropathy: continue on home dose of gabapentin   Hypokalemia: KCl repleted. Will continue to monitor    DVT prophylaxis: lovenox Code Status: full  Family Communication:  Disposition Plan: depends on PT/OT recs   Consultants:   ICU   Procedures:    Antimicrobials:    Subjective: Pt c/o fatigue   Objective: Vitals:   03/03/20 0400 03/03/20 0600 03/03/20 0745 03/03/20 0800  BP: 123/74 124/77  122/71  Pulse: 81 72 75 69  Resp:      Temp: 98.8 F (37.1 C)  97.9 F (36.6 C) 97.9 F (36.6 C)  TempSrc: Oral  Oral Oral  SpO2: 100% 98% 98% 97%  Weight: 70.1 kg     Height:        Intake/Output Summary (Last 24 hours) at 03/03/2020 0825 Last data filed at 03/02/2020 2000 Gross per 24 hour  Intake 1067.56 ml  Output 1595 ml  Net -527.44 ml   Filed Weights   03/01/20 1800 03/02/20 0500 03/03/20 0400  Weight: 66.8 kg 68.1 kg 70.1 kg    Examination:  General exam: Appears calm and comfortable  Respiratory system: Clear to auscultation. No rales, rhonchi  Cardiovascular system: S1 & S2 +. No rubs, gallops or clicks.  Gastrointestinal system: Abdomen is nondistended, soft and nontender. Normal bowel sounds heard. Central nervous  system: Alert and oriented. Moves all 4 extremities  Psychiatry: Judgement and insight appear normal. Mood & affect appropriate.     Data Reviewed: I have personally reviewed following labs and imaging studies  CBC: Recent Labs  Lab 02/29/20 2144 03/01/20 0527 03/02/20 0522 03/03/20 0423  WBC 11.5* 11.4* 7.6 6.5  NEUTROABS  --  7.0 5.4 3.1  HGB 16.8* 14.2 13.1 13.2  HCT 48.0* 37.9 36.6 36.9  MCV 91.3 87.7 89.3 89.8  PLT 286 202 160 811   Basic Metabolic Panel: Recent Labs  Lab 03/01/20 0243 03/01/20 0243 03/01/20 0527 03/01/20 0527 03/01/20 0815 03/01/20 1142 03/01/20 1522 03/01/20 1941 03/02/20 0522 03/03/20 0423  NA 136   < > 137   < >  --  134* 134* 135 136 139  K 4.3   < > 4.3   < >  --  4.9 4.5 4.3 4.0 2.8*  CL 102   < > 105   < >  --  104 106 106 106 108  CO2 18*   < > 18*   < >  --  19* 20* 22 20* 23  GLUCOSE 321*   < > 194*   < >  --  206* 181* 160* 189* 130*  BUN 31*   < > 29*   < >  --  24* 25* 23* 23* 14  CREATININE 1.10*   < > 0.83   < >  --  0.73 0.65 0.54 0.61 0.56  CALCIUM 9.8   < > 9.2   < >  --  8.8* 8.8* 8.9 8.6* 8.7*  MG 1.6*  --  1.5*  --  3.1*  --   --   --  2.1 1.8  PHOS 2.9  --   --   --   --   --   --   --   --   --    < > = values in this interval not displayed.   GFR: Estimated Creatinine Clearance: 82.6 mL/min (by C-G formula based on SCr of 0.56 mg/dL). Liver Function Tests: Recent Labs  Lab 02/29/20 2309  AST 12*  ALT 13  ALKPHOS 59  BILITOT 2.0*  PROT 9.2*  ALBUMIN 4.8   No results for input(s): LIPASE, AMYLASE in the last 168 hours. Recent Labs  Lab 02/29/20 2336  AMMONIA 14   Coagulation Profile: No results for input(s): INR, PROTIME in the last 168 hours. Cardiac Enzymes: No results for input(s): CKTOTAL, CKMB, CKMBINDEX, TROPONINI in the last 168 hours. BNP (last 3 results) No results for input(s): PROBNP in the last 8760 hours. HbA1C: Recent Labs    03/02/20 0522  HGBA1C 11.1*   CBG: Recent Labs  Lab  03/02/20 0725 03/02/20 1137 03/02/20 1555 03/02/20 2145 03/03/20 0710  GLUCAP 168* 132* 91 109* 75   Lipid Profile: No results for input(s): CHOL, HDL, LDLCALC, TRIG, CHOLHDL, LDLDIRECT in the last 72 hours. Thyroid Function Tests: No results for input(s): TSH, T4TOTAL, FREET4, T3FREE, THYROIDAB in the last 72 hours. Anemia Panel: No results for input(s): VITAMINB12, FOLATE, FERRITIN, TIBC, IRON, RETICCTPCT in the last 72 hours. Sepsis Labs: Recent Labs  Lab 02/29/20 2309 03/01/20 0243  PROCALCITON  --  <0.10  LATICACIDVEN 2.4* 2.2*    Recent Results (from the past 240 hour(s))  SARS Coronavirus 2 by RT PCR (hospital order, performed in [facility] hospital lab) Nasopharyngeal Nasopharyngeal Swab     Status: None   Collection Time: 03/01/20  1:07 AM   Specimen: Nasopharyngeal Swab  Result Value Ref Range Status   SARS Coronavirus 2 NEGATIVE NEGATIVE Final    Comment: (NOTE) SARS-CoV-2 target nucleic acids are NOT DETECTED.  The SARS-CoV-2 RNA is generally detectable in upper and lower respiratory specimens during the acute phase of infection. The lowest concentration of SARS-CoV-2 viral copies this assay can detect is 250 copies / mL. A negative result does not preclude SARS-CoV-2 infection and should not be used as the sole basis for treatment or other patient management decisions.  A negative result may occur with improper specimen collection / handling, submission of specimen other than nasopharyngeal swab, presence of viral mutation(s) within the areas targeted by this assay, and inadequate number of viral copies (<250 copies / mL). A negative result must be combined with clinical observations, patient history, and epidemiological information.  Fact Sheet for Patients:   StrictlyIdeas.no  Fact Sheet for Healthcare Providers: BankingDealers.co.za  This test is not yet approved or  cleared by the [place] FDA  and has been authorized for detection and/or diagnosis of SARS-CoV-2 by FDA under an Emergency Use Authorization (EUA).  This EUA will remain in effect (meaning this test can be used) for the duration of the COVID-19 declaration under Section 564(b)(1) of the Act, 21 U.S.C. section 360bbb-3(b)(1), unless the authorization is terminated or revoked sooner.  Performed at [facility], [address] [place], [place] [address]   CULTURE, BLOOD (ROUTINE X 2)  w Reflex to ID Panel     Status: None (Preliminary result)   Collection Time: 03/01/20  4:24 AM   Specimen: BLOOD  Result Value Ref Range Status   Specimen Description BLOOD RIGHT HAND  Final   Special Requests IN PEDIATRIC BOTTLE Blood Culture adequate volume  Final   Culture   Final    NO GROWTH 2 DAYS Performed at [facility], [address], [place], [place] [address]    Report Status PENDING  Incomplete  CULTURE, BLOOD (ROUTINE X 2) w Reflex to ID Panel     Status: None (Preliminary result)   Collection Time: 03/01/20  5:27 AM   Specimen: BLOOD  Result Value Ref Range Status   Specimen Description BLOOD LEFT FA  Final   Special Requests   Final    BOTTLES DRAWN AEROBIC AND ANAEROBIC Blood Culture adequate volume   Culture   Final    NO GROWTH 2 DAYS Performed at [facility], [address], [place], [place] [address]    Report Status PENDING  Incomplete  MRSA PCR Screening     Status: None   Collection Time: 03/01/20  6:35 PM   Specimen: Nasal Mucosa; Nasopharyngeal  Result Value Ref Range Status   MRSA by PCR NEGATIVE NEGATIVE Final    Comment:        The GeneXpert MRSA Assay (FDA approved for NASAL specimens only), is one component of a comprehensive MRSA colonization surveillance program. It is not intended to diagnose MRSA infection nor to guide or monitor treatment for MRSA infections. Performed at [facility], [address], [place], [place] [address]           Radiology Studies: No results found.      Scheduled Meds: . aspirin EC  81 mg Oral Daily  . atorvastatin  10 mg Oral Daily  . Chlorhexidine Gluconate Cloth  6 each Topical Daily  . docusate sodium  100 mg Oral BID  . DULoxetine  30 mg Oral Daily  . gabapentin  400 mg Oral TID  . insulin aspart  0-15 Units Subcutaneous TID WC  . insulin aspart  0-5 Units Subcutaneous QHS  . insulin detemir  30 Units Subcutaneous BID  . lisinopril  2.5 mg Oral Daily  . potassium chloride  40 mEq Oral Q6H   Continuous Infusions: . sodium chloride 75 mL/hr at 03/03/20 0437  . sodium chloride 10 mL/hr at 03/03/20 0401  . famotidine (PEPCID) IV 20 mg (03/03/20 0341)     LOS: 2 days    Time spent: 35 mins     Wyvonnia Dusky, MD [facility] Pager [phone]  If 7PM-7AM, please contact night-coverage [email or web address] 03/03/2020, 8:25 AM

## 2020-03-03 NOTE — Evaluation (Signed)
Physical Therapy Evaluation Patient Details Name: [name] MRN: [id number] DOB: 1970/11/08 Today's Date: 03/03/2020   History of Present Illness  This is a 49 yo female with a PMH of UTI, Neuropathy, Migraine Headaches, GERD, Diabetic Neuropathy, Type II Diabetes Mellitus, Depression, Anxiety, Anemia, and Allergic Rhinitis.  She presented to [facility] ER on 06/14 via EMS from home with altered mental status onset 3 days prior to ER presentation  Clinical Impression  Patient received sitting up on side of bed. She is agreeable to PT session, but reports she needs to have BM. Assisted patient to toilet, then continued with ambulation for 150 feet without ad, min guard needed due to slight unsteadiness with ambulation in hallway. She will continue to benefit from skilled PT while here to improve balance and safety for return home at discharge.        Follow Up Recommendations No PT follow up    Equipment Recommendations  None recommended by PT    Recommendations for Other Services       Precautions / Restrictions Precautions Precautions: Fall Precaution Comments: low fall risk Restrictions Weight Bearing Restrictions: No      Mobility  Bed Mobility Overal bed mobility: Independent             General bed mobility comments: patient received sitting up on side of bed, returned herself to supine without assistance  Transfers Overall transfer level: Needs assistance Equipment used: None Transfers: Sit to/from Stand Sit to Stand: Supervision            Ambulation/Gait Ambulation/Gait assistance: Min guard Gait Distance (Feet): 150 Feet Assistive device: None Gait Pattern/deviations: Step-through pattern;Drifts right/left Gait velocity: WNL   General Gait Details: patient steady in room but once out in hallway she was mildly unsteady requiring min guard for safety  Stairs            Wheelchair Mobility    Modified Rankin (Stroke Patients Only)        Balance Overall balance assessment: Needs assistance Sitting-balance support: Feet supported Sitting balance-Leahy Scale: Good     Standing balance support: During functional activity;No upper extremity supported Standing balance-Leahy Scale: Fair Standing balance comment: slightly unsteady with ambulation out in hallway                             Pertinent Vitals/Pain Pain Assessment: No/denies pain    Home Living Family/patient expects to be discharged to:: Private residence Living Arrangements: Children Available Help at Discharge: Family;Available PRN/intermittently Type of Home: Mobile home Home Access: Stairs to enter Entrance Stairs-Rails: Right Entrance Stairs-Number of Steps: 4-5 Home Layout: One level Home Equipment: Cane - single point      Prior Function Level of Independence: Independent               Hand Dominance        Extremity/Trunk Assessment   Upper Extremity Assessment Upper Extremity Assessment: Overall WFL for tasks assessed    Lower Extremity Assessment Lower Extremity Assessment: Overall WFL for tasks assessed    Cervical / Trunk Assessment Cervical / Trunk Assessment: Normal  Communication   Communication: No difficulties  Cognition Arousal/Alertness: Awake/alert Behavior During Therapy: WFL for tasks assessed/performed;Flat affect Overall Cognitive Status: Within Functional Limits for tasks assessed  General Comments      Exercises     Assessment/Plan    PT Assessment Patient needs continued PT services  PT Problem List Decreased strength;Decreased balance       PT Treatment Interventions Therapeutic activities;Gait training;Stair training;Functional mobility training;Balance training;Therapeutic exercise;Patient/family education    PT Goals (Current goals can be found in the Care Plan section)  Acute Rehab PT Goals Patient Stated Goal: to  return home with son PT Goal Formulation: With patient Time For Goal Achievement: 03/10/20 Potential to Achieve Goals: Good    Frequency Min 2X/week   Barriers to discharge        Co-evaluation               AM-PAC PT "6 Clicks" Mobility  Outcome Measure Help needed turning from your back to your side while in a flat bed without using bedrails?: None Help needed moving from lying on your back to sitting on the side of a flat bed without using bedrails?: None Help needed moving to and from a bed to a chair (including a wheelchair)?: None Help needed standing up from a chair using your arms (e.g., wheelchair or bedside chair)?: None Help needed to walk in hospital room?: A Little Help needed climbing 3-5 steps with a railing? : A Little 6 Click Score: 22    End of Session Equipment Utilized During Treatment: Gait belt Activity Tolerance: Patient tolerated treatment well Patient left: in bed Nurse Communication: Mobility status PT Visit Diagnosis: Unsteadiness on feet (R26.81);Muscle weakness (generalized) (M62.81)    Time: 1856-3149 PT Time Calculation (min) (ACUTE ONLY): 24 min   Charges:   PT Evaluation $PT Eval Low Complexity: 1 Low PT Treatments $Gait Training: 8-22 mins        Rontae Inglett, PT, GCS 03/03/20,3:50 PM

## 2020-03-03 NOTE — Progress Notes (Signed)
Inpatient Diabetes Program Recommendations  AACE/ADA: New Consensus Statement on Inpatient Glycemic Control   Target Ranges:  Prepandial:   less than 140 mg/dL      Peak postprandial:   less than 180 mg/dL (1-2 hours)      Critically ill patients:  140 - 180 mg/dL   Results for [name], [name] (MRN [id number]) as of 03/03/2020 08:00  Ref. Range 03/02/2020 07:25 03/02/2020 11:37 03/02/2020 15:55 03/02/2020 21:45 03/03/2020 07:10  Glucose-Capillary Latest Ref Range: 70 - 99 mg/dL 168 (H) 132 (H) 91 109 (H) 75  Results for [name], [name] (MRN [id number]) as of 03/03/2020 08:00  Ref. Range 03/02/2020 05:22  Hemoglobin A1C Latest Ref Range: 4.8 - 5.6 % 11.1 (H)   Review of Glycemic Control  Diabetes history: DM2 Outpatient Diabetes medications: Levemir 50 units BID, Novolog 3-15 units TID with meals, Metformin 1000 mg BID, Jardiance 10 mg daily Current orders for Inpatient glycemic control: Levemir 33 units BID, Novolog 0-15 units TID with meals, Novolog 0-5 units QHS  Inpatient Diabetes Program Recommendations:    Insulin-Basal: Please consider decreasing Levemir to 30 units BID.  HbgA1C:  A1C 11.1% on 03/02/20 indicating an average glucose of 272 mg/dl over the past 2-3 months.  Outpatient DM medications: Since patient recently started taking Jardiance and presented in DKA, may want to have patient stop Jardiance at time of discharge and follow up with PCP regarding DM control. Also, may want to discharge on similar insulin regimen as used while inpatient if glucose is fairly well controlled.  Thanks, Crystal Alderman, RN, MSN, CDE Diabetes Coordinator Inpatient Diabetes Program [phone] (Team Pager from 8am to 5pm)

## 2020-03-03 NOTE — Progress Notes (Signed)
PHARMACY CONSULT NOTE - FOLLOW UP  Pharmacy Consult for Electrolyte Monitoring and Replacement   Recent Labs: Potassium (mmol/L)  Date Value  03/03/2020 2.8 (L)  09/29/2014 3.6   Magnesium (mg/dL)  Date Value  03/02/2020 2.1   Calcium (mg/dL)  Date Value  03/03/2020 8.7 (L)   Calcium, Total (mg/dL)  Date Value  09/29/2014 8.6   Albumin (g/dL)  Date Value  02/29/2020 4.8  06/18/2013 3.3 (L)   Phosphorus (mg/dL)  Date Value  03/01/2020 2.9   Sodium (mmol/L)  Date Value  03/03/2020 139  09/29/2014 132 (L)    Assessment: 49 y/o F w/ poorly controlled DM2 who presented w/ altered mental status & DKA and initially admitted to the ICU. Pt is off of insulin drip and now on subcutaneous insulin  Goal of Therapy:  Electrolytes WNL   Plan:   Oral KCl 40 mEq x 2 per Sharion Settler, NP  recheck electrolytes on 6/18 with AM labs  Dallie Piles ,PharmD Clinical Pharmacist 03/03/2020 7:19 AM

## 2020-03-04 DIAGNOSIS — D72828 Other elevated white blood cell count: Secondary | ICD-10-CM

## 2020-03-04 LAB — BASIC METABOLIC PANEL
Anion gap: 5 (ref 5–15)
BUN: 10 mg/dL (ref 6–20)
CO2: 25 mmol/L (ref 22–32)
Calcium: 8.9 mg/dL (ref 8.9–10.3)
Chloride: 114 mmol/L — ABNORMAL HIGH (ref 98–111)
Creatinine, Ser: 0.41 mg/dL — ABNORMAL LOW (ref 0.44–1.00)
GFR calc Af Amer: >60 mL/min (ref >60)
GFR calc non Af Amer: >60 mL/min (ref >60)
Glucose, Bld: 108 mg/dL — ABNORMAL HIGH (ref 70–99)
Potassium: 3.7 mmol/L (ref 3.5–5.1)
Sodium: 144 mmol/L (ref 135–145)

## 2020-03-04 LAB — CBC WITH DIFFERENTIAL/PLATELET
Abs Immature Granulocytes: 0.02 10*3/uL (ref 0.00–0.07)
Basophils Absolute: 0 10*3/uL (ref 0.0–0.1)
Basophils Relative: 1 %
Eosinophils Absolute: 0.1 10*3/uL (ref 0.0–0.5)
Eosinophils Relative: 2 %
HCT: 36.4 % (ref 36.0–46.0)
Hemoglobin: 13.4 g/dL (ref 12.0–15.0)
Immature Granulocytes: 0 %
Lymphocytes Relative: 63 %
Lymphs Abs: 3.2 10*3/uL (ref 0.7–4.0)
MCH: 32.1 pg (ref 26.0–34.0)
MCHC: 36.8 g/dL — ABNORMAL HIGH (ref 30.0–36.0)
MCV: 87.3 fL (ref 80.0–100.0)
Monocytes Absolute: 0.4 10*3/uL (ref 0.1–1.0)
Monocytes Relative: 8 %
Neutro Abs: 1.3 10*3/uL — ABNORMAL LOW (ref 1.7–7.7)
Neutrophils Relative %: 26 %
Platelets: 172 10*3/uL (ref 150–400)
RBC: 4.17 MIL/uL (ref 3.87–5.11)
RDW: 12.5 % (ref 11.5–15.5)
WBC: 5 10*3/uL (ref 4.0–10.5)
nRBC: 0 % (ref 0.0–0.2)

## 2020-03-04 LAB — GLUCOSE, CAPILLARY
Glucose-Capillary: 90 mg/dL (ref 70–99)
Glucose-Capillary: 97 mg/dL (ref 70–99)
Glucose-Capillary: 169 mg/dL — ABNORMAL HIGH (ref 70–99)

## 2020-03-04 LAB — MAGNESIUM: Magnesium: 2 mg/dL (ref 1.7–2.4)

## 2020-03-04 MED ORDER — ONDANSETRON HCL 4 MG PO TABS: 4.0000 mg | ORAL_TABLET | Freq: Three times a day (TID) | ORAL | 0 refills | Status: AC | PRN

## 2020-03-04 NOTE — Care Management (Signed)
Clinical information faxed to [facility] Per Jefm Bryant they will review the information and if they referral is accepted call the patient . MD updated  Bedside RN to notify patient.  In the mean time she will need to follow up with PCP

## 2020-03-04 NOTE — Discharge Summary (Signed)
Physician Discharge Summary  [name] KWI:[id number] DOB: [birth year] DOA: 02/29/2020  PCP: [facility] [place] date: 02/29/2020 Discharge date: 03/04/2020  Admitted From: home Disposition:  Home   Recommendations for Outpatient Follow-up:  1. Follow up with PCP in 1-2 weeks 2. F/u endo, Dr. Gabriel Carina, in 1-2 weeks  Home Health: no Equipment/Devices:  Discharge Condition: stable CODE STATUS: full  Diet recommendation: Heart Healthy / Carb Modified  Brief/Interim Summary: HPI was taken from Dr. Mortimer Fries: This is a 49 yo female with a PMH of UTI, Neuropathy, Migraine Headaches, GERD, Diabetic Neuropathy, Type II Diabetes Mellitus, Depression, Anxiety, Anemia, and Allergic Rhinitis.  She presented to [facility] ER on 06/14 via EMS from home with altered mental status onset 3 days prior to ER presentation.  EMS reported the pts family stated the pt had worsening weakness and confusion throughout the day on 06/14.  They also reported the pt was unable to stand/walk and couldn't answer questions appropriately, but they were unsure if the pt had fallen.  In the ER pt remained confused. ER labs were: Na+ 134, chloride 96, CO2 14, glucose 492, BUN 33, creatinine 1.17, anion gap 24, lactic acid 2.4, wbc 11.5, and vbg pH 7.22/pCO2 41/acid-base deficit 10.5/bicarb 16.8.  Pt ruled in for DKA 2L LR bolus and insulin gtt initiated.  CT Head negative for acute intracranial abnormality.  PCCM contacted for ICU admission.    Hospital Course from Dr. Lenise Herald 6/17-6/18/21: Pt presented w/ DKA secondary to uncontrolled DM2. Pt was initially admitted to the ICU and put on a insulin drip. Pt was eventually weaned off of the insulin drip and transitioned back to sq insulin. It was recommended that the pt f/u outpatient endocrinologist. Pt verbalized her understanding. For more information, please see previous progress notes.   Discharge Diagnoses:  Active Problems:   DKA (diabetic ketoacidoses)  (HCC)   Leukocytosis   Acute metabolic encephalopathy   Lactic acidosis   Acute renal failure (ARF) (HCC)  DKA: weaned of insulin drip. Continue on levemir, SSI w/ accuchecks. DM coordinator following. Resolved.  Acute metabolic encephalopathy: likely secondary to above. Resolved  DM2: poorly controlled. HbA1c 11.1. Continue on levemir, SSI w/ accuchecks. DM coordinator following. Recommending pt see an endocrinologist an outpatient.  HTN: will continue on lisinopril   Depression: severity unknown. Continue on home dose of duloxetine  Peripheral neuropathy: continue on home dose of gabapentin   Hypokalemia: WNL today. Will continue to monitor  Discharge Instructions  Discharge Instructions    Diet - low sodium heart healthy   Complete by: As directed    Diet Carb Modified   Complete by: As directed    Discharge instructions   Complete by: As directed    F/u PCP in 1-2 weeks. F/u endo (Melissa Solum) in 1 week   Increase activity slowly   Complete by: As directed      Allergies as of 03/04/2020   No Known Allergies     Medication List    STOP taking these medications   insulin aspart 100 UNIT/ML injection Commonly known as: novoLOG     TAKE these medications   aspirin 81 MG EC tablet Take 81 mg by mouth daily.   atorvastatin 10 MG tablet Commonly known as: LIPITOR Take 10 mg by mouth daily.   b complex vitamins tablet Take 1 tablet by mouth daily.   DULoxetine 60 MG capsule Commonly known as: CYMBALTA Take 60 mg by mouth daily.   DULoxetine  30 MG capsule Commonly known as: CYMBALTA Take 30 mg by mouth every evening.   empagliflozin 10 MG Tabs tablet Commonly known as: JARDIANCE Take 10 mg by mouth daily.   furosemide 20 MG tablet Commonly known as: LASIX Take 20 mg by mouth 2 (two) times daily.   gabapentin 400 MG capsule Commonly known as: NEURONTIN Take 400 mg by mouth 3 (three) times daily.   glucose blood test strip Use as  instructed up to 4 times daily   hydrOXYzine 25 MG tablet Commonly known as: ATARAX/VISTARIL Take 25 mg by mouth.   INS SYRINGE/NEEDLE 1CC/28G 28G X 1/2" 1 ML Misc Commonly known as: B-D INSULIN SYRINGE 1CC/28G 1 Syringe by Does not apply route daily after supper.   insulin detemir 100 UNIT/ML injection Commonly known as: LEVEMIR Inject 50 Units into the skin 2 (two) times daily. Take 50 units in the morning and 50 units at night.   Lancets Misc Patient test blood sugar two times daily.   lisinopril 2.5 MG tablet Commonly known as: ZESTRIL Take 2.5 mg by mouth daily.   metformin 500 MG (OSM) 24 hr tablet Commonly known as: FORTAMET Take 1,000 mg by mouth 2 (two) times daily.   omeprazole 40 MG capsule Commonly known as: PriLOSEC Take 1 capsule (40 mg total) by mouth daily.   ondansetron 4 MG tablet Commonly known as: Zofran Take 1 tablet (4 mg total) by mouth every 8 (eight) hours as needed for up to 7 days for nausea or vomiting.   pregabalin 50 MG capsule Commonly known as: LYRICA Take 50 mg by mouth 3 (three) times daily.   traZODone 50 MG tablet Commonly known as: DESYREL Take 50-100 mg by mouth at bedtime.       Follow-up Information    Solum, Betsey Holiday, MD In 1 week.   Specialty: Endocrinology Why: Needs to establish for poorly controlled DM2 NEEDS REFERRAL Contact information: [address] [place] [address] [phone]        Donnie Coffin, MD. Go on 03/16/2020.   Specialty: Family Medicine Why: @3 :40p Contact information: [address] [place] [address] [phone]              No Known Allergies  Consultations:  ICU   Procedures/Studies: DG Chest 2 View  Result Date: 02/29/2020 CLINICAL DATA:  Altered mental status. EXAM: CHEST - 2 VIEW COMPARISON:  01/30/2018 FINDINGS: The heart size and mediastinal contours are within normal limits. Both lungs are clear. The visualized skeletal  structures are unremarkable. IMPRESSION: No active cardiopulmonary disease. Electronically Signed   By: Kerby Moors M.D.   On: 02/29/2020 21:37   CT Head Wo Contrast  Result Date: 02/29/2020 CLINICAL DATA:  TIA. Altered mental status for 3 days. Progressive weakness. Confusion. Headache. EXAM: CT HEAD WITHOUT CONTRAST TECHNIQUE: Contiguous axial images were obtained from the base of the skull through the vertex without intravenous contrast. COMPARISON:  Head CT 01/30/2014 FINDINGS: Brain: Brain volume is normal for age. No intracranial hemorrhage, mass effect, or midline shift. No hydrocephalus. The basilar cisterns are patent. No evidence of territorial infarct or acute ischemia. No extra-axial or intracranial fluid collection. Vascular: No hyperdense vessel or unexpected calcification. Skull: No fracture or focal lesion. Sinuses/Orbits: Scattered mucosal thickening of ethmoid air cells. No sinus fluid levels. Mastoid air cells are clear. Included orbits are unremarkable. Other: None. IMPRESSION: 1. No acute intracranial abnormality. 2. Mild ethmoid sinus mucosal thickening. Electronically Signed  By: Keith Rake M.D.   On: 02/29/2020 21:48     Subjective: Pt denies any complaints    Discharge Exam: Vitals:   03/04/20 0504 03/04/20 1158  BP: 99/63 136/86  Pulse: 67 70  Resp: 18 16  Temp: 98.5 F (36.9 C) 98.1 F (36.7 C)  SpO2: 98% 98%   Vitals:   03/03/20 1833 03/03/20 2152 03/04/20 0504 03/04/20 1158  BP: 129/78 (!) 145/85 99/63 136/86  Pulse: 79 68 67 70  Resp: 14 20 18 16   Temp: 98.4 F (36.9 C) 98.5 F (36.9 C) 98.5 F (36.9 C) 98.1 F (36.7 C)  TempSrc: Oral   Oral  SpO2: 100% 100% 98% 98%  Weight:      Height:        General: Pt is alert, awake, not in acute distress Cardiovascular: S1/S2 +, no rubs, no gallops Respiratory: CTA bilaterally, no wheezing, no rhonchi Abdominal: Soft, NT, ND, bowel sounds + Extremities: no edema, no cyanosis    The results  of significant diagnostics from this hospitalization (including imaging, microbiology, ancillary and laboratory) are listed below for reference.     Microbiology: Recent Results (from the past 240 hour(s))  SARS Coronavirus 2 by RT PCR (hospital order, performed in [facility] hospital lab) Nasopharyngeal Nasopharyngeal Swab     Status: None   Collection Time: 03/01/20  1:07 AM   Specimen: Nasopharyngeal Swab  Result Value Ref Range Status   SARS Coronavirus 2 NEGATIVE NEGATIVE Final    Comment: (NOTE) SARS-CoV-2 target nucleic acids are NOT DETECTED.  The SARS-CoV-2 RNA is generally detectable in upper and lower respiratory specimens during the acute phase of infection. The lowest concentration of SARS-CoV-2 viral copies this assay can detect is 250 copies / mL. A negative result does not preclude SARS-CoV-2 infection and should not be used as the sole basis for treatment or other patient management decisions.  A negative result may occur with improper specimen collection / handling, submission of specimen other than nasopharyngeal swab, presence of viral mutation(s) within the areas targeted by this assay, and inadequate number of viral copies (<250 copies / mL). A negative result must be combined with clinical observations, patient history, and epidemiological information.  Fact Sheet for Patients:   StrictlyIdeas.no  Fact Sheet for Healthcare Providers: BankingDealers.co.za  This test is not yet approved or  cleared by the [place] FDA and has been authorized for detection and/or diagnosis of SARS-CoV-2 by FDA under an Emergency Use Authorization (EUA).  This EUA will remain in effect (meaning this test can be used) for the duration of the COVID-19 declaration under Section 564(b)(1) of the Act, 21 U.S.C. section 360bbb-3(b)(1), unless the authorization is terminated or revoked sooner.  Performed at [facility],  [address] [place], [place] [address]   CULTURE, BLOOD (ROUTINE X 2) w Reflex to ID Panel     Status: None (Preliminary result)   Collection Time: 03/01/20  4:24 AM   Specimen: BLOOD  Result Value Ref Range Status   Specimen Description BLOOD RIGHT HAND  Final   Special Requests IN PEDIATRIC BOTTLE Blood Culture adequate volume  Final   Culture   Final    NO GROWTH 3 DAYS Performed at [facility], [address] [place], [place] [address]    Report Status PENDING  Incomplete  CULTURE, BLOOD (ROUTINE X 2) w Reflex to ID Panel     Status: None (Preliminary result)   Collection Time: 03/01/20  5:27 AM  Specimen: BLOOD  Result Value Ref Range Status   Specimen Description BLOOD LEFT FA  Final   Special Requests   Final    BOTTLES DRAWN AEROBIC AND ANAEROBIC Blood Culture adequate volume   Culture   Final    NO GROWTH 3 DAYS Performed at [facility], [address] [place], [place] [address]    Report Status PENDING  Incomplete  MRSA PCR Screening     Status: None   Collection Time: 03/01/20  6:35 PM   Specimen: Nasal Mucosa; Nasopharyngeal  Result Value Ref Range Status   MRSA by PCR NEGATIVE NEGATIVE Final    Comment:        The GeneXpert MRSA Assay (FDA approved for NASAL specimens only), is one component of a comprehensive MRSA colonization surveillance program. It is not intended to diagnose MRSA infection nor to guide or monitor treatment for MRSA infections. Performed at [facility], [address] [place], [place] [address]      Labs: BNP (last 3 results) No results for input(s): BNP in the last 8760 hours. Basic Metabolic Panel: Recent Labs  Lab 03/01/20 0243 03/01/20 0243 03/01/20 0527 03/01/20 0815 03/01/20 1142 03/01/20 1522 03/01/20 1941 03/02/20 0522 03/03/20 0423 03/04/20 0421  NA 136   < > 137  --    < > 134* 135 136 139 144  K 4.3   < > 4.3  --    < > 4.5 4.3 4.0 2.8* 3.7  CL 102   < > 105  --    < >  106 106 106 108 114*  CO2 18*   < > 18*  --    < > 20* 22 20* 23 25  GLUCOSE 321*   < > 194*  --    < > 181* 160* 189* 130* 108*  BUN 31*   < > 29*  --    < > 25* 23* 23* 14 10  CREATININE 1.10*   < > 0.83  --    < > 0.65 0.54 0.61 0.56 0.41*  CALCIUM 9.8   < > 9.2  --    < > 8.8* 8.9 8.6* 8.7* 8.9  MG 1.6*   < > 1.5* 3.1*  --   --   --  2.1 1.8 2.0  PHOS 2.9  --   --   --   --   --   --   --   --   --    < > = values in this interval not displayed.   Liver Function Tests: Recent Labs  Lab 02/29/20 2309  AST 12*  ALT 13  ALKPHOS 59  BILITOT 2.0*  PROT 9.2*  ALBUMIN 4.8   No results for input(s): LIPASE, AMYLASE in the last 168 hours. Recent Labs  Lab 02/29/20 2336  AMMONIA 14   CBC: Recent Labs  Lab 02/29/20 2144 03/01/20 0527 03/02/20 0522 03/03/20 0423 03/04/20 0421  WBC 11.5* 11.4* 7.6 6.5 5.0  NEUTROABS  --  7.0 5.4 3.1 1.3*  HGB 16.8* 14.2 13.1 13.2 13.4  HCT 48.0* 37.9 36.6 36.9 36.4  MCV 91.3 87.7 89.3 89.8 87.3  PLT 286 202 160 169 172   Cardiac Enzymes: No results for input(s): CKTOTAL, CKMB, CKMBINDEX, TROPONINI in the last 168 hours. BNP: Invalid input(s): POCBNP CBG: Recent Labs  Lab 03/03/20 1559 03/03/20 2156 03/04/20 0751 03/04/20 0752 03/04/20 1156  GLUCAP 150* 126* 90 97 169*   D-Dimer No results for input(s): DDIMER  in the last 72 hours. Hgb A1c Recent Labs    03/02/20 0522  HGBA1C 11.1*   Lipid Profile No results for input(s): CHOL, HDL, LDLCALC, TRIG, CHOLHDL, LDLDIRECT in the last 72 hours. Thyroid function studies No results for input(s): TSH, T4TOTAL, T3FREE, THYROIDAB in the last 72 hours.  Invalid input(s): FREET3 Anemia work up No results for input(s): VITAMINB12, FOLATE, FERRITIN, TIBC, IRON, RETICCTPCT in the last 72 hours. Urinalysis    Component Value Date/Time   COLORURINE STRAW (A) 02/29/2020 2120   APPEARANCEUR CLEAR (A) 02/29/2020 2120   APPEARANCEUR Clear 01/30/2014 1254   LABSPEC 1.030 02/29/2020 2120    LABSPEC 1.039 01/30/2014 1254   PHURINE 5.0 02/29/2020 2120   GLUCOSEU >=500 (A) 02/29/2020 2120   GLUCOSEU >=500 01/30/2014 1254   HGBUR SMALL (A) 02/29/2020 2120   BILIRUBINUR NEGATIVE 02/29/2020 2120   BILIRUBINUR neg 09/07/2015 1619   BILIRUBINUR Negative 01/30/2014 1254   KETONESUR 80 (A) 02/29/2020 2120   PROTEINUR 30 (A) 02/29/2020 2120   UROBILINOGEN 0.2 09/07/2015 1619   NITRITE NEGATIVE 02/29/2020 2120   LEUKOCYTESUR NEGATIVE 02/29/2020 2120   LEUKOCYTESUR Negative 01/30/2014 1254   Sepsis Labs Invalid input(s): PROCALCITONIN,  WBC,  LACTICIDVEN Microbiology Recent Results (from the past 240 hour(s))  SARS Coronavirus 2 by RT PCR (hospital order, performed in [facility] hospital lab) Nasopharyngeal Nasopharyngeal Swab     Status: None   Collection Time: 03/01/20  1:07 AM   Specimen: Nasopharyngeal Swab  Result Value Ref Range Status   SARS Coronavirus 2 NEGATIVE NEGATIVE Final    Comment: (NOTE) SARS-CoV-2 target nucleic acids are NOT DETECTED.  The SARS-CoV-2 RNA is generally detectable in upper and lower respiratory specimens during the acute phase of infection. The lowest concentration of SARS-CoV-2 viral copies this assay can detect is 250 copies / mL. A negative result does not preclude SARS-CoV-2 infection and should not be used as the sole basis for treatment or other patient management decisions.  A negative result may occur with improper specimen collection / handling, submission of specimen other than nasopharyngeal swab, presence of viral mutation(s) within the areas targeted by this assay, and inadequate number of viral copies (<250 copies / mL). A negative result must be combined with clinical observations, patient history, and epidemiological information.  Fact Sheet for Patients:   StrictlyIdeas.no  Fact Sheet for Healthcare Providers: BankingDealers.co.za  This test is not yet approved or  cleared by  the [place] FDA and has been authorized for detection and/or diagnosis of SARS-CoV-2 by FDA under an Emergency Use Authorization (EUA).  This EUA will remain in effect (meaning this test can be used) for the duration of the COVID-19 declaration under Section 564(b)(1) of the Act, 21 U.S.C. section 360bbb-3(b)(1), unless the authorization is terminated or revoked sooner.  Performed at [facility], [address] [place], [place] [address]   CULTURE, BLOOD (ROUTINE X 2) w Reflex to ID Panel     Status: None (Preliminary result)   Collection Time: 03/01/20  4:24 AM   Specimen: BLOOD  Result Value Ref Range Status   Specimen Description BLOOD RIGHT HAND  Final   Special Requests IN PEDIATRIC BOTTLE Blood Culture adequate volume  Final   Culture   Final    NO GROWTH 3 DAYS Performed at [facility], [address] [place], [place] [address]    Report Status PENDING  Incomplete  CULTURE, BLOOD (ROUTINE X 2) w Reflex to ID Panel     Status:  None (Preliminary result)   Collection Time: 03/01/20  5:27 AM   Specimen: BLOOD  Result Value Ref Range Status   Specimen Description BLOOD LEFT FA  Final   Special Requests   Final    BOTTLES DRAWN AEROBIC AND ANAEROBIC Blood Culture adequate volume   Culture   Final    NO GROWTH 3 DAYS Performed at [facility], [address], [place], [place] [address]    Report Status PENDING  Incomplete  MRSA PCR Screening     Status: None   Collection Time: 03/01/20  6:35 PM   Specimen: Nasal Mucosa; Nasopharyngeal  Result Value Ref Range Status   MRSA by PCR NEGATIVE NEGATIVE Final    Comment:        The GeneXpert MRSA Assay (FDA approved for NASAL specimens only), is one component of a comprehensive MRSA colonization surveillance program. It is not intended to diagnose MRSA infection nor to guide or monitor treatment for MRSA infections. Performed at [facility], [address], [place],  [place] [address]      Time coordinating discharge: Over 30 minutes  SIGNED:   Wyvonnia Dusky, MD  [facility] 03/04/2020, 1:17 PM Pager   If 7PM-7AM, please contact night-coverage [email or web address]

## 2020-03-04 NOTE — Plan of Care (Signed)
Discharge order received. Patient mental status is at baseline. Vital signs stable . No signs of acute distress. Discharge instructions given. Patient verbalized understanding. No other issues noted at this time.   

## 2020-03-06 LAB — CULTURE, BLOOD (ROUTINE X 2)
Culture: NO GROWTH
Culture: NO GROWTH
Special Requests: ADEQUATE
Special Requests: ADEQUATE

## 2020-03-24 ENCOUNTER — Encounter: Payer: Medicaid Other | Attending: Physician Assistant | Admitting: Physician Assistant

## 2020-03-24 ENCOUNTER — Other Ambulatory Visit: Payer: Self-pay

## 2020-03-24 DIAGNOSIS — L03116 Cellulitis of left lower limb: Secondary | ICD-10-CM | POA: Insufficient documentation

## 2020-03-24 DIAGNOSIS — Z833 Family history of diabetes mellitus: Secondary | ICD-10-CM | POA: Diagnosis not present

## 2020-03-24 DIAGNOSIS — E11621 Type 2 diabetes mellitus with foot ulcer: Secondary | ICD-10-CM | POA: Diagnosis not present

## 2020-03-24 DIAGNOSIS — E1136 Type 2 diabetes mellitus with diabetic cataract: Secondary | ICD-10-CM | POA: Diagnosis not present

## 2020-03-24 DIAGNOSIS — L97522 Non-pressure chronic ulcer of other part of left foot with fat layer exposed: Secondary | ICD-10-CM | POA: Diagnosis not present

## 2020-03-24 DIAGNOSIS — F17210 Nicotine dependence, cigarettes, uncomplicated: Secondary | ICD-10-CM | POA: Insufficient documentation

## 2020-03-24 DIAGNOSIS — E1140 Type 2 diabetes mellitus with diabetic neuropathy, unspecified: Secondary | ICD-10-CM | POA: Diagnosis not present

## 2020-03-25 NOTE — Progress Notes (Signed)
[name], TEST ([id number]) Visit Report for 03/24/2020 Abuse/Suicide Risk Screen Details Patient Name: [name], [name] Date of Service: 03/24/2020 12:45 PM Medical Record Number: [id number] Patient Account Number: [id number] Date of Birth/Sex: 26-May-1971 (49 y.o. Female) Treating RN: Cornell Barman Primary Care Deavin Forst: Tomasa Hose Other Clinician: Referring Averly Ericson: Tomasa Hose Treating Jaslene Marsteller/Extender: STONE III, HOYT Weeks in Treatment: 0 Abuse/Suicide Risk Screen Items Answer ABUSE RISK SCREEN: Has anyone close to you tried to hurt or harm you recentlyo No Do you feel uncomfortable with anyone in your familyo No Has anyone forced you do things that you didnot want to doo No Electronic Signature(s) Signed: 03/24/2020 5:43:28 PM By: Gretta Cool, BSN, RN, CWS, Kim RN, BSN Entered By: Gretta Cool, BSN, RN, CWS, Kim on 03/24/2020 13:05:54 [name] ([id number]) -------------------------------------------------------------------------------- Activities of Daily Living Details Patient Name: [name] Date of Service: 03/24/2020 12:45 PM Medical Record Number: [id number] Patient Account Number: [id number] Date of Birth/Sex: 12-12-1970 (49 y.o. Female) Treating RN: Cornell Barman Primary Care Shawnna Pancake: Tomasa Hose Other Clinician: Referring Ashaz Robling: Tomasa Hose Treating Tianne Plott/Extender: Melburn Hake, HOYT Weeks in Treatment: 0 Activities of Daily Living Items Answer Activities of Daily Living (Please select one for each item) Drive Automobile Not Able Take Medications Completely Able Use Telephone Completely Able Care for Appearance Completely Able Use Toilet Completely Able Bath / Shower Completely Able Dress Self Completely Able Feed Self Completely Able Walk Completely Able Get In / Out Bed Completely Able Housework Completely Able Prepare Meals Completely [place] for Self Completely Able Electronic Signature(s) Signed: 03/24/2020 5:43:28 PM By: Gretta Cool,  BSN, RN, CWS, Kim RN, BSN Entered By: Gretta Cool, BSN, RN, CWS, Kim on 03/24/2020 13:06:21 [name] ([id number]) -------------------------------------------------------------------------------- Education Screening Details Patient Name: [name] Date of Service: 03/24/2020 12:45 PM Medical Record Number: [id number] Patient Account Number: [id number] Date of Birth/Sex: 10-05-1970 (49 y.o. Female) Treating RN: Cornell Barman Primary Care Ocean Kearley: Tomasa Hose Other Clinician: Referring Chancey Cullinane: Tomasa Hose Treating Daziyah Cogan/Extender: Melburn Hake, HOYT Weeks in Treatment: 0 Primary Learner Assessed: Caregiver SIster, Kim Reason Patient is not Primary Learner: Both Learning Preferences/Education Level/Primary Language Learning Preference: Explanation, Demonstration Highest Education Level: High School Preferred Language: Diplomatic Services operational officer Language Barrier: No Translator Needed: No Memory Deficit: No Emotional Barrier: No Physical Barrier Impaired Vision: Yes Glasses Impaired Hearing: No Decreased Hand dexterity: No Knowledge/Comprehension Knowledge Level: High Comprehension Level: High Ability to understand written instructions: High Ability to understand verbal instructions: High Motivation Anxiety Level: Calm Cooperation: Cooperative Education Importance: Acknowledges Need Interest in Health Problems: Asks Questions Perception: Coherent Willingness to Engage in Self-Management High Activities: Readiness to Engage in Self-Management High Activities: Engineer, maintenance) Signed: 03/24/2020 5:43:28 PM By: Gretta Cool, BSN, RN, CWS, Kim RN, BSN Entered By: Gretta Cool, BSN, RN, CWS, Kim on 03/24/2020 13:07:14 ZHANAE, PROFFIT ([id number]) -------------------------------------------------------------------------------- Fall Risk Assessment Details Patient Name: [name] Date of Service: 03/24/2020 12:45 PM Medical Record Number: [id number] Patient Account Number:  [id number] Date of Birth/Sex: [birth year] (49 y.o. Female) Treating RN: Cornell Barman Primary Care Harlin Mazzoni: Tomasa Hose Other Clinician: Referring Khaleed Holan: Tomasa Hose Treating Jermeka Schlotterbeck/Extender: Melburn Hake, HOYT Weeks in Treatment: 0 Fall Risk Assessment Items Have you had 2 or more falls in the last 12 monthso 0 Yes Have you had any fall that resulted in injury in the last 12 monthso 0 No FALLS RISK SCREEN History of falling - immediate or within 3 months 0 No Secondary diagnosis (Do you have 2 or more medical diagnoseso) 0 No  Ambulatory aid None/bed rest/wheelchair/nurse 0 Yes Crutches/cane/walker 15 Yes Furniture 0 No Intravenous therapy Access/Saline/Heparin Lock 0 No Gait/Transferring Normal/ bed rest/ wheelchair 0 No Weak (short steps with or without shuffle, stooped but able to lift head while walking, may 10 Yes seek support from furniture) Impaired (short steps with shuffle, may have difficulty arising from chair, head down, impaired 0 No balance) Mental Status Oriented to own ability 0 Yes Electronic Signature(s) Signed: 03/24/2020 5:43:28 PM By: Gretta Cool, BSN, RN, CWS, Kim RN, BSN Entered By: Gretta Cool, BSN, RN, CWS, Kim on 03/24/2020 13:07:50 [name] ([id number]) -------------------------------------------------------------------------------- Foot Assessment Details Patient Name: [name] Date of Service: 03/24/2020 12:45 PM Medical Record Number: [id number] Patient Account Number: [id number] Date of Birth/Sex: 13-Apr-1971 (49 y.o. Female) Treating RN: Cornell Barman Primary Care Peityn Payton: Tomasa Hose Other Clinician: Referring Prisila Dlouhy: Tomasa Hose Treating Donley Harland/Extender: Melburn Hake, HOYT Weeks in Treatment: 0 Foot Assessment Items Site Locations + = Sensation present, - = Sensation absent, C = Callus, U = Ulcer R = Redness, W = Warmth, M = Maceration, PU = Pre-ulcerative lesion F = Fissure, S = Swelling, D = Dryness Assessment Right: Left: Other Deformity:  No No Prior Foot Ulcer: No No Prior Amputation: No No Charcot Joint: No No Ambulatory Status: Ambulatory Without Help Gait: Steady Electronic Signature(s) Signed: 03/24/2020 5:43:28 PM By: Gretta Cool, BSN, RN, CWS, Kim RN, BSN Entered By: Gretta Cool, BSN, RN, CWS, Kim on 03/24/2020 13:12:21 [name] ([id number]) -------------------------------------------------------------------------------- Nutrition Risk Screening Details Patient Name: [name] Date of Service: 03/24/2020 12:45 PM Medical Record Number: [id number] Patient Account Number: [id number] Date of Birth/Sex: Feb 01, 1971 (49 y.o. Female) Treating RN: Cornell Barman Primary Care Aliegha Paullin: Tomasa Hose Other Clinician: Referring Porchia Sinkler: Tomasa Hose Treating Keishaun Hazel/Extender: STONE III, HOYT Weeks in Treatment: 0 Height (in): 64 Weight (lbs): 142 Body Mass Index (BMI): 24.4 Nutrition Risk Screening Items Score Screening NUTRITION RISK SCREEN: I have an illness or condition that made me change the kind and/or amount of food I eat 0 No I eat fewer than two meals per day 0 No I eat few fruits and vegetables, or milk products 0 No I have three or more drinks of beer, liquor or wine almost every day 0 No I have tooth or mouth problems that make it hard for me to eat 0 No I don't always have enough money to buy the food I need 0 No I eat alone most of the time 1 Yes I take three or more different prescribed or over-the-counter drugs a day 1 Yes Without wanting to, I have lost or gained 10 pounds in the last six months 0 No I am not always physically able to shop, cook and/or feed myself 0 No Nutrition Protocols Good Risk Protocol Provide education on elevated Moderate Risk Protocol 0 blood sugars and impact on wound healing, as applicable High Risk Proctocol Risk Level: Good Risk Score: 2 Electronic Signature(s) Signed: 03/24/2020 5:43:28 PM By: Gretta Cool, BSN, RN, CWS, Kim RN, BSN Entered By: Gretta Cool, BSN, RN, CWS, Kim on  03/24/2020 13:08:26

## 2020-03-25 NOTE — Progress Notes (Signed)
EVADNA, DONAGHY ([id number]) Visit Report for 03/24/2020 Allergy List Details Patient Name: [name], [name] Date of Service: 03/24/2020 12:45 PM Medical Record Number: [id number] Patient Account Number: [id number] Date of Birth/Sex: [birth year] (49 y.o. Female) Treating RN: Cornell Barman Primary Care Saketh Daubert: Tomasa Hose Other Clinician: Referring Damen Windsor: Tomasa Hose Treating Lonell Stamos/Extender: Melburn Hake, HOYT Weeks in Treatment: 0 Allergies Active Allergies No Known Drug Allergies Allergy Notes Electronic Signature(s) Signed: 03/24/2020 5:43:28 PM By: Gretta Cool, BSN, RN, CWS, Kim RN, BSN Entered By: Gretta Cool, BSN, RN, CWS, Kim on 03/24/2020 12:59:49 [name] ([id number]) -------------------------------------------------------------------------------- Arrival Information Details Patient Name: [name] Date of Service: 03/24/2020 12:45 PM Medical Record Number: [id number] Patient Account Number: [id number] Date of Birth/Sex: 1970/12/22 (49 y.o. Female) Treating RN: Army Melia Primary Care Grayton Lobo: Tomasa Hose Other Clinician: Referring Norvell Caswell: Tomasa Hose Treating Alane Hanssen/Extender: Melburn Hake, HOYT Weeks in Treatment: 0 Visit Information Patient Arrived: Ambulatory Arrival Time: 12:50 Accompanied By: sister Transfer Assistance: None Patient Identification Verified: Yes Secondary Verification Process Completed: Yes Electronic Signature(s) Signed: 03/24/2020 1:51:13 PM By: Lorine Bears RCP, RRT, CHT Entered By: Lorine Bears on 03/24/2020 12:51:34 [name] ([id number]) -------------------------------------------------------------------------------- Clinic Level of Care Assessment Details Patient Name: [name] Date of Service: 03/24/2020 12:45 PM Medical Record Number: [id number] Patient Account Number: [id number] Date of Birth/Sex: Feb 03, 1971 (49 y.o. Female) Treating RN: Army Melia Primary Care Kingsly Kloepfer: Tomasa Hose Other  Clinician: Referring Saleem Coccia: Tomasa Hose Treating Darel Ricketts/Extender: Melburn Hake, HOYT Weeks in Treatment: 0 Clinic Level of Care Assessment Items TOOL 2 Quantity Score []  - Use when only an EandM is performed on the INITIAL visit 0 ASSESSMENTS - Nursing Assessment / Reassessment X - General Physical Exam (combine w/ comprehensive assessment (listed just below) when performed on new 1 20 pt. evals) X- 1 25 Comprehensive Assessment (HX, ROS, Risk Assessments, Wounds Hx, etc.) ASSESSMENTS - Wound and Skin Assessment / Reassessment X - Simple Wound Assessment / Reassessment - one wound 1 5 []  - 0 Complex Wound Assessment / Reassessment - multiple wounds []  - 0 Dermatologic / Skin Assessment (not related to wound area) ASSESSMENTS - Ostomy and/or Continence Assessment and Care []  - Incontinence Assessment and Management 0 []  - 0 Ostomy Care Assessment and Management (repouching, etc.) PROCESS - Coordination of Care X - Simple Patient / Family Education for ongoing care 1 15 []  - 0 Complex (extensive) Patient / Family Education for ongoing care []  - 0 Staff obtains Programmer, systems, Records, Test Results / Process Orders []  - 0 Staff telephones HHA, Nursing Homes / Clarify orders / etc []  - 0 Routine Transfer to another Facility (non-emergent condition) []  - 0 Routine Hospital Admission (non-emergent condition) X- 1 15 New Admissions / Biomedical engineer / Ordering NPWT, Apligraf, etc. []  - 0 Emergency Hospital Admission (emergent condition) X- 1 10 Simple Discharge Coordination []  - 0 Complex (extensive) Discharge Coordination PROCESS - Special Needs []  - Pediatric / Minor Patient Management 0 []  - 0 Isolation Patient Management []  - 0 Hearing / Language / Visual special needs []  - 0 Assessment of Community assistance (transportation, D/C planning, etc.) []  - 0 Additional assistance / Altered mentation []  - 0 Support Surface(s) Assessment (bed, cushion, seat,  etc.) INTERVENTIONS - Wound Cleansing / Measurement X - Wound Imaging (photographs - any number of wounds) 1 5 []  - 0 Wound Tracing (instead of photographs) X- 1 5 Simple Wound Measurement - one wound []  - 0 Complex Wound Measurement - multiple wounds SHARNELLE, CAPPELLI. ([id number])  X- 1 5 Simple Wound Cleansing - one wound []  - 0 Complex Wound Cleansing - multiple wounds INTERVENTIONS - Wound Dressings []  - Small Wound Dressing one or multiple wounds 0 X- 1 15 Medium Wound Dressing one or multiple wounds []  - 0 Large Wound Dressing one or multiple wounds []  - 0 Application of Medications - injection INTERVENTIONS - Miscellaneous []  - External ear exam 0 []  - 0 Specimen Collection (cultures, biopsies, blood, body fluids, etc.) []  - 0 Specimen(s) / Culture(s) sent or taken to Lab for analysis []  - 0 Patient Transfer (multiple staff / Harrel Lemon Lift / Similar devices) []  - 0 Simple Staple / Suture removal (25 or less) []  - 0 Complex Staple / Suture removal (26 or more) []  - 0 Hypo / Hyperglycemic Management (close monitor of Blood Glucose) []  - 0 Ankle / Brachial Index (ABI) - do not check if billed separately Has the patient been seen at the hospital within the last three years: Yes Total Score: 120 Level Of Care: New/Established - Level 4 Electronic Signature(s) Signed: 03/24/2020 3:29:06 PM By: Army Melia Entered By: Army Melia on 03/24/2020 13:55:46 [name] ([id number]) -------------------------------------------------------------------------------- Encounter Discharge Information Details Patient Name: [name] Date of Service: 03/24/2020 12:45 PM Medical Record Number: [id number] Patient Account Number: [id number] Date of Birth/Sex: [birth year] (49 y.o. Female) Treating RN: Army Melia Primary Care Harjot Dibello: Tomasa Hose Other Clinician: Referring Riya Huxford: Tomasa Hose Treating Cornie Mccomber/Extender: Melburn Hake, HOYT Weeks in Treatment: 0 Encounter Discharge  Information Items Post Procedure Vitals Discharge Condition: Stable Temperature (F): 98.6 Ambulatory Status: Ambulatory Pulse (bpm): 77 Discharge Destination: Home Respiratory Rate (breaths/min): 16 Transportation: Private Auto Blood Pressure (mmHg): 94/61 Accompanied By: sister Schedule Follow-up Appointment: Yes Clinical Summary of Care: Electronic Signature(s) Signed: 03/24/2020 3:29:06 PM By: Army Melia Entered By: Army Melia on 03/24/2020 13:57:24 [name] ([id number]) -------------------------------------------------------------------------------- Lower Extremity Assessment Details Patient Name: [name] Date of Service: 03/24/2020 12:45 PM Medical Record Number: [id number] Patient Account Number: [id number] Date of Birth/Sex: [birth year] (49 y.o. Female) Treating RN: Cornell Barman Primary Care Pretty Weltman: Tomasa Hose Other Clinician: Referring Darell Saputo: Tomasa Hose Treating Malakye Nolden/Extender: STONE III, HOYT Weeks in Treatment: 0 Edema Assessment Assessed: [Left: No] [Right: No] Edema: [Left: N] [Right: o] Vascular Assessment Pulses: Dorsalis Pedis Palpable: [Left:Yes] Doppler Audible: [Left:Yes] Posterior Tibial Palpable: [Left:Yes Inaudible] Notes Unable to get ABI reading. Bounding pulses until first pump and pulse disappears. Electronic Signature(s) Signed: 03/24/2020 5:43:28 PM By: Gretta Cool, BSN, RN, CWS, Kim RN, BSN Entered By: Gretta Cool, BSN, RN, CWS, Kim on 03/24/2020 13:24:24 SAYDE, LISH ([id number]) -------------------------------------------------------------------------------- Multi Wound Chart Details Patient Name: [name] Date of Service: 03/24/2020 12:45 PM Medical Record Number: [id number] Patient Account Number: [id number] Date of Birth/Sex: 02-Sep-1971 (49 y.o. Female) Treating RN: Army Melia Primary Care Daveon Arpino: Tomasa Hose Other Clinician: Referring Samina Weekes: Tomasa Hose Treating Romolo Sieling/Extender: STONE III, HOYT Weeks in  Treatment: 0 Vital Signs Height(in): 64 Pulse(bpm): 34 Weight(lbs): 142 Blood Pressure(mmHg): 94/61 Body Mass Index(BMI): 24 Temperature(F): 98.6 Respiratory Rate(breaths/min): 16 Photos: [N/A:N/A] Wound Location: Left, Lateral Foot N/A N/A Wounding Event: Gradually Appeared N/A N/A Primary Etiology: Diabetic Wound/Ulcer of the Lower N/A N/A Extremity Comorbid History: Cataracts, Type II Diabetes, N/A N/A Neuropathy Date Acquired: 03/13/2020 N/A N/A Weeks of Treatment: 0 N/A N/A Wound Status: Open N/A N/A Measurements L x W x D (cm) 1.2x2x0.1 N/A N/A Area (cm) : 1.885 N/A N/A Volume (cm) : 0.188 N/A N/A % Reduction in Area: 0.00% N/A N/A % Reduction  in Volume: 0.00% N/A N/A Classification: Grade 1 N/A N/A Exudate Amount: Medium N/A N/A Exudate Type: Serous N/A N/A Exudate Color: amber N/A N/A Wound Margin: Flat and Intact N/A N/A Granulation Amount: Medium (34-66%) N/A N/A Necrotic Amount: Medium (34-66%) N/A N/A Exposed Structures: Fat Layer (Subcutaneous Tissue) N/A N/A Exposed: Yes Fascia: No Tendon: No Muscle: No Joint: No Bone: No Epithelialization: Small (1-33%) N/A N/A Treatment Notes Electronic Signature(s) Signed: 03/24/2020 3:29:06 PM By: Army Melia Entered By: Army Melia on 03/24/2020 13:46:55 [name] ([id number]) -------------------------------------------------------------------------------- [place] Details Patient Name: [name] Date of Service: 03/24/2020 12:45 PM Medical Record Number: [id number] Patient Account Number: [id number] Date of Birth/Sex: 05/24/1971 (49 y.o. Female) Treating RN: Army Melia Primary Care Pasty Manninen: Tomasa Hose Other Clinician: Referring Keshara Kiger: Tomasa Hose Treating Tityana Pagan/Extender: Melburn Hake, HOYT Weeks in Treatment: 0 Active Inactive Orientation to the Wound Care Program Nursing Diagnoses: Knowledge deficit related to the wound healing center  program Goals: Patient/caregiver will verbalize understanding of the [place] Program Date Initiated: 03/24/2020 Target Resolution Date: 04/22/2020 Goal Status: Active Interventions: Provide education on orientation to the wound center Notes: Wound/Skin Impairment Nursing Diagnoses: Impaired tissue integrity Goals: Ulcer/skin breakdown will have a volume reduction of 30% by week 4 Date Initiated: 03/24/2020 Target Resolution Date: 04/22/2020 Goal Status: Active Interventions: Assess ulceration(s) every visit Notes: Electronic Signature(s) Signed: 03/24/2020 3:29:06 PM By: Army Melia Entered By: Army Melia on 03/24/2020 13:46:19 [name] ([id number]) -------------------------------------------------------------------------------- Pain Assessment Details Patient Name: [name] Date of Service: 03/24/2020 12:45 PM Medical Record Number: [id number] Patient Account Number: [id number] Date of Birth/Sex: 1971/07/11 (49 y.o. Female) Treating RN: Army Melia Primary Care Griffin Gerrard: Tomasa Hose Other Clinician: Referring Felis Quillin: Tomasa Hose Treating Myrle Wanek/Extender: Melburn Hake, HOYT Weeks in Treatment: 0 Active Problems Location of Pain Severity and Description of Pain Patient Has Paino No Site Locations Pain Management and Medication Current Pain Management: Electronic Signature(s) Signed: 03/24/2020 1:51:13 PM By: Lorine Bears RCP, RRT, CHT Signed: 03/24/2020 3:29:06 PM By: Army Melia Entered By: Lorine Bears on 03/24/2020 12:52:00 [name] ([id number]) -------------------------------------------------------------------------------- Patient/Caregiver Education Details Patient Name: [name] Date of Service: 03/24/2020 12:45 PM Medical Record Number: [id number] Patient Account Number: [id number] Date of Birth/Gender: 1971-08-03 (49 y.o. Female) Treating RN: Army Melia Primary Care Physician: Tomasa Hose Other  Clinician: Referring Physician: Tomasa Hose Treating Physician/Extender: Melburn Hake, HOYT Weeks in Treatment: 0 Education Assessment Education Provided To: Patient Education Topics Provided Wound/Skin Impairment: Handouts: Caring for Your Ulcer Methods: Demonstration, Explain/Verbal Responses: State content correctly Electronic Signature(s) Signed: 03/24/2020 3:29:06 PM By: Army Melia Entered By: Army Melia on 03/24/2020 13:56:07 [name] ([id number]) -------------------------------------------------------------------------------- Wound Assessment Details Patient Name: [name] Date of Service: 03/24/2020 12:45 PM Medical Record Number: [id number] Patient Account Number: [id number] Date of Birth/Sex: 21-Jun-1971 (49 y.o. Female) Treating RN: Cornell Barman Primary Care Giovani Neumeister: Tomasa Hose Other Clinician: Referring Isiaha Greenup: Tomasa Hose Treating Alika Saladin/Extender: STONE III, HOYT Weeks in Treatment: 0 Wound Status Wound Number: 1 Primary Etiology: Diabetic Wound/Ulcer of the Lower Extremity Wound Location: Left, Lateral Foot Wound Status: Open Wounding Event: Gradually Appeared Comorbid History: Cataracts, Type II Diabetes, Neuropathy Date Acquired: 03/13/2020 Weeks Of Treatment: 0 Clustered Wound: No Photos Wound Measurements Length: (cm) 1.2 Width: (cm) 2 Depth: (cm) 0.1 Area: (cm) 1.885 Volume: (cm) 0.188 % Reduction in Area: 0% % Reduction in Volume: 0% Epithelialization: Small (1-33%) Tunneling: No Undermining: No Wound Description Classification: Grade 1 Wound Margin: Flat and  Intact Exudate Amount: Medium Exudate Type: Serous Exudate Color: amber Foul Odor After Cleansing: No Slough/Fibrino Yes Wound Bed Granulation Amount: Medium (34-66%) Exposed Structure Necrotic Amount: Medium (34-66%) Fascia Exposed: No Necrotic Quality: Adherent Slough Fat Layer (Subcutaneous Tissue) Exposed: Yes Tendon Exposed: No Muscle Exposed: No Joint  Exposed: No Bone Exposed: No Treatment Notes Wound #1 (Left, Lateral Foot) Notes Santyl, saline gauze, telfa Manufacturing systems engineer) Signed: 03/24/2020 5:43:28 PM By: Gretta Cool, BSN, RN, CWS, Kim RN, BSN [address] [place] ([id number]) Entered By: Gretta Cool, BSN, RN, CWS, Kim on 03/24/2020 13:18:40 [name] ([id number]) -------------------------------------------------------------------------------- Vitals Details Patient Name: [name] Date of Service: 03/24/2020 12:45 PM Medical Record Number: [id number] Patient Account Number: [id number] Date of Birth/Sex: Jan 01, 1971 (49 y.o. Female) Treating RN: Army Melia Primary Care Quinley Nesler: Tomasa Hose Other Clinician: Referring Elayjah Chaney: Tomasa Hose Treating Adrick Kestler/Extender: STONE III, HOYT Weeks in Treatment: 0 Vital Signs Time Taken: 12:50 Temperature (F): 98.6 Height (in): 64 Pulse (bpm): 77 Source: Stated Respiratory Rate (breaths/min): 16 Weight (lbs): 142 Blood Pressure (mmHg): 94/61 Source: Measured Reference Range: 80 - 120 mg / dl Body Mass Index (BMI): 24.4 Electronic Signature(s) Signed: 03/24/2020 5:43:28 PM By: Gretta Cool, BSN, RN, CWS, Kim RN, BSN Entered By: Gretta Cool, BSN, RN, CWS, Kim on 03/24/2020 12:59:26

## 2020-03-30 NOTE — Progress Notes (Signed)
CODA, FILLER ([id number]) Visit Report for 03/24/2020 Chief Complaint Document Details Patient Name: [name], [name]. Date of Service: 03/24/2020 12:45 PM Medical Record Number: [id number] Patient Account Number: [id number] Date of Birth/Sex: [birth year] (49 y.o. Female) Treating RN: Army Melia Primary Care Provider: Tomasa Hose Other Clinician: Referring Provider: Tomasa Hose Treating Provider/Extender: Melburn Hake, Joeseph Verville Weeks in Treatment: 0 Information Obtained from: Patient Chief Complaint Left Foot Ulcer Electronic Signature(s) Signed: 03/24/2020 1:34:36 PM By: Worthy Keeler PA-C Entered By: Worthy Keeler on 03/24/2020 13:34:35 [name] ([id number]) -------------------------------------------------------------------------------- Debridement Details Patient Name: [name] Date of Service: 03/24/2020 12:45 PM Medical Record Number: [id number] Patient Account Number: [id number] Date of Birth/Sex: 1970/12/22 (49 y.o. Female) Treating RN: Army Melia Primary Care Provider: Tomasa Hose Other Clinician: Referring Provider: Tomasa Hose Treating Provider/Extender: STONE III, Aryannah Mohon Weeks in Treatment: 0 Debridement Performed for Wound #1 Left,Lateral Foot Assessment: Performed By: Physician STONE III, Brittanie Dosanjh E., PA-C Debridement Type: Debridement Severity of Tissue Pre Debridement: Fat layer exposed Level of Consciousness (Pre- Awake and Alert procedure): Pre-procedure Verification/Time Out Yes - 13:46 Taken: Start Time: 13:47 Pain Control: Lidocaine Total Area Debrided (L x W): 1.2 (cm) x 2 (cm) = 2.4 (cm) Tissue and other material Non-Viable, Callus, Skin: Dermis debrided: Level: Skin/Dermis Debridement Description: Selective/Open Wound Instrument: Forceps, Scissors Bleeding: None End Time: 13:48 Response to Treatment: Procedure was tolerated well Level of Consciousness (Post- Awake and Alert procedure): Post Debridement Measurements of Total Wound Length:  (cm) 1.2 Width: (cm) 2 Depth: (cm) 0.1 Volume: (cm) 0.188 Character of Wound/Ulcer Post Debridement: Stable Severity of Tissue Post Debridement: Fat layer exposed Post Procedure Diagnosis Same as Pre-procedure Electronic Signature(s) Signed: 03/24/2020 4:51:48 PM By: Worthy Keeler PA-C Signed: 03/30/2020 4:08:07 PM By: Army Melia Previous Signature: 03/24/2020 3:29:06 PM Version By: Army Melia Entered By: Worthy Keeler on 03/24/2020 16:51:48 [name] ([id number]) -------------------------------------------------------------------------------- HPI Details Patient Name: [name] Date of Service: 03/24/2020 12:45 PM Medical Record Number: [id number] Patient Account Number: [id number] Date of Birth/Sex: May 30, 1971 (49 y.o. Female) Treating RN: Army Melia Primary Care Provider: Tomasa Hose Other Clinician: Referring Provider: Tomasa Hose Treating Provider/Extender: Melburn Hake, Chidinma Clites Weeks in Treatment: 0 History of Present Illness HPI Description: 03/24/2020 upon evaluation today patient presents for initial inspection here in our clinic concerning the wound on her left lateral foot and the fifth metatarsal region which came up about 2 weeks ago. Subsequently the patient has been seen by her primary care provider who did place her on Bactrim that does seem to have been beneficial. She does have a hemoglobin of 11.1 which was measured on 03/02/2020. She was in the hospital right around that time due to diabetic ketoacidosis for 7 days. Apparently this was due to medication change. At this point she seems to be doing somewhat better which is great news. Fortunately there is no signs of active infection at this time. Patient's culture grew mixed flora that was no definitive diagnosis here as far as the bacteria involved. However the Bactrim does seem to be helping her pain is also greatly improved. Currently the been doing vinegar water soaks along with Dakin's applied to the wound  afterwards this is what was used on her heel previous whenever she had an issue. Subsequently the patient has not had vascular evaluation unfortunately her ABIs were somewhat unusual we could not really get a good reading today therefore I question whether she has good arterial flow for that reason I would avoid  any aggressive debridement right now until we can get her in for an arterial study with ABI and TBI. She is seen with her sister in the office today as well. Electronic Signature(s) Signed: 03/24/2020 4:49:07 PM By: Worthy Keeler PA-C Entered By: Worthy Keeler on 03/24/2020 16:49:07 TIJUANA, SCHEIDEGGER ([id number]) -------------------------------------------------------------------------------- Physical Exam Details Patient Name: [name] Date of Service: 03/24/2020 12:45 PM Medical Record Number: [id number] Patient Account Number: [id number] Date of Birth/Sex: 1970/11/22 (49 y.o. Female) Treating RN: Army Melia Primary Care Provider: Tomasa Hose Other Clinician: Referring Provider: Tomasa Hose Treating Provider/Extender: STONE III, Leyda Vanderwerf Weeks in Treatment: 0 Constitutional sitting or standing blood pressure is within target range for patient.. pulse regular and within target range for patient.Marland Kitchen respirations regular, non- labored and within target range for patient.Marland Kitchen temperature within target range for patient.. Well-nourished and well-hydrated in no acute distress. Eyes conjunctiva clear no eyelid edema noted. pupils equal round and reactive to light and accommodation. Ears, Nose, Mouth, and Throat no gross abnormality of ear auricles or external auditory canals. normal hearing noted during conversation. mucus membranes moist. Respiratory normal breathing without difficulty. Cardiovascular 1+ dorsalis pedis/posterior tibialis pulses. no clubbing, cyanosis, significant edema, <3 sec cap refill. Musculoskeletal normal gait and posture. no significant deformity or arthritic  changes, no loss or range of motion, no clubbing. Psychiatric this patient is able to make decisions and demonstrates good insight into disease process. Alert and Oriented x 3. pleasant and cooperative. Notes Upon inspection patient's wound bed actually showed signs of noted on the surface of the wound. I am not exactly sure how far this is good to really extend when it is all said and done. Right now we really do not have an indication of that. I did trim away some of the blister tissue around the edges of the wound which the patient tolerated without any pain today and post debridement the wound bed appears to be doing well at this point except for she does need some debridement here of the superficial surface. I am going to use Santyl at this point in order to begin the debridement process. I think that is good to be optimal. Electronic Signature(s) Signed: 03/24/2020 4:50:00 PM By: Worthy Keeler PA-C Entered By: Worthy Keeler on 03/24/2020 16:50:00 [name] ([id number]) -------------------------------------------------------------------------------- Physician Orders Details Patient Name: [name] Date of Service: 03/24/2020 12:45 PM Medical Record Number: [id number] Patient Account Number: [id number] Date of Birth/Sex: 05/25/1971 (49 y.o. 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 Primary Wound Dressing Wound #1 Left,Lateral Foot o Santyl Ointment Secondary Dressing o Saline moistened gauze o [place] Dressing Change Frequency Wound #1 Left,Lateral Foot o Change dressing every day. Follow-up Appointments Wound #1 Left,Lateral Foot o Return Appointment in 1 week. Off-Loading o Open toe surgical shoe with peg assist. Consults o Vascular - Arterial, ABI, TBI Patient Medications Allergies: No Known Drug Allergies Notifications Medication Indication Start End Santyl 03/24/2020 DOSE topical 250 unit/gram ointment - ointment topical Apply nickel thick daily to the wound bed and then cover with a dressing as directed  in clinic Electronic Signature(s) Signed: 03/24/2020 2:01:27 PM By: Worthy Keeler PA-C Entered By: Worthy Keeler on 03/24/2020 14:01:25 HENDRIX, CONSOLE ([id number]) -------------------------------------------------------------------------------- Problem List Details Patient Name: [name] Date of Service: 03/24/2020 12:45 PM Medical Record Number: [id number] Patient Account Number: [id number] Date of Birth/Sex: 07-09-1971 (49 y.o. Female) Treating RN: Army Melia Primary Care Provider: Tomasa Hose Other Clinician: Referring Provider: Tomasa Hose Treating Provider/Extender: Melburn Hake, Hayly Litsey Weeks in Treatment: 0 Active Problems ICD-10 Encounter Code Description Active Date MDM Diagnosis E11.621 Type 2 diabetes mellitus with foot ulcer 03/24/2020 No Yes L97.522 Non-pressure chronic ulcer of other part of left foot with fat layer 03/24/2020 No Yes exposed L03.116 Cellulitis of left lower limb 03/24/2020 No Yes Inactive Problems Resolved Problems Electronic Signature(s) Signed: 03/24/2020 1:33:42 PM By: Worthy Keeler PA-C Entered By: Worthy Keeler on 03/24/2020 13:33:40 [name] ([id number]) -------------------------------------------------------------------------------- Progress Note Details Patient Name: [name] Date of Service: 03/24/2020 12:45 PM Medical Record Number: [id number] Patient Account Number: [id number] Date of Birth/Sex:  [birth year] (49 y.o. Female) Treating RN: Army Melia Primary Care Provider: Tomasa Hose Other Clinician: Referring Provider: Tomasa Hose Treating Provider/Extender: Melburn Hake, Brittany Amirault Weeks in Treatment: 0 Subjective Chief Complaint Information obtained from Patient Left Foot Ulcer History of Present Illness (HPI) 03/24/2020 upon evaluation today patient presents for initial inspection here in our clinic concerning the wound on her left lateral foot and the fifth metatarsal region which came up about 2 weeks ago. Subsequently the patient has been seen by her primary care provider who did place her on Bactrim that does seem to have been beneficial. She does have a hemoglobin of 11.1 which was measured on 03/02/2020. She was in the hospital right around that time due to diabetic ketoacidosis for 7 days. Apparently this was due to medication change. At this point she seems to be doing somewhat better which is great news. Fortunately there is no signs of active infection at this time. Patient's culture grew mixed flora that was no definitive diagnosis here as far as the bacteria involved. However the Bactrim does seem to be helping her pain is also greatly improved. Currently the been doing vinegar water soaks along with Dakin's applied to the wound afterwards this is what was used on her heel previous whenever she had an issue. Subsequently the patient has not had vascular evaluation unfortunately her ABIs were somewhat unusual we could not really get a good reading today therefore I question whether she has good arterial flow for that reason I would avoid any aggressive debridement right now until we can get her in for an arterial study with ABI and TBI. She is seen with her sister in the office today as well. Patient History Information obtained from Patient. Allergies No Known Drug Allergies Family History Diabetes - Father,Maternal Grandparents,Paternal Grandparents, No family history of  Cancer. Social History Current every day smoker - 1/2 pack daily, Marital Status - Single, Alcohol Use - Never, Drug Use - No History, Caffeine Use - Daily. Medical History Eyes Patient has history of Cataracts - April 19, 2020 Endocrine Patient has history of Type II Diabetes - 15 years Neurologic Patient has history of Neuropathy Patient is treated with Insulin, Oral Agents. Blood sugar is tested. Blood sugar results noted at the following times: Breakfast - 143. Hospitalization/Surgery History - DKA. Medical And Surgical History Notes Eyes Diabetic Retinopathy Review of Systems (ROS) Ear/Nose/Mouth/Throat Denies complaints or symptoms of Difficult clearing ears, Sinusitis. Hematologic/Lymphatic Denies complaints  or symptoms of Bleeding / Clotting Disorders, Human Immunodeficiency Virus. Respiratory Denies complaints or symptoms of Chronic or frequent coughs, Shortness of Breath. Cardiovascular Denies complaints or symptoms of Chest pain, LE edema. Gastrointestinal Denies complaints or symptoms of Frequent diarrhea, Nausea, Vomiting. Genitourinary Denies complaints or symptoms of Kidney failure/ Dialysis, Incontinence/dribbling. Immunological Denies complaints or symptoms of Hives, Itching. Integumentary (Skin) Complains or has symptoms of Wounds. Denies complaints or symptoms of Bleeding or bruising tendency. NIKKITA, ADEYEMI. ([id number]) Musculoskeletal Denies complaints or symptoms of Muscle Pain, Muscle Weakness. Psychiatric Complains or has symptoms of Anxiety, Claustrophobia. Objective Constitutional sitting or standing blood pressure is within target range for patient.. pulse regular and within target range for patient.Marland Kitchen respirations regular, non- labored and within target range for patient.Marland Kitchen temperature within target range for patient.. Well-nourished and well-hydrated in no acute distress. Vitals Time Taken: 12:50 PM, Height: 64 in, Source: Stated, Weight: 142 lbs,  Source: Measured, BMI: 24.4, Temperature: 98.6 F, Pulse: 77 bpm, Respiratory Rate: 16 breaths/min, Blood Pressure: 94/61 mmHg. Eyes conjunctiva clear no eyelid edema noted. pupils equal round and reactive to light and accommodation. Ears, Nose, Mouth, and Throat no gross abnormality of ear auricles or external auditory canals. normal hearing noted during conversation. mucus membranes moist. Respiratory normal breathing without difficulty. Cardiovascular 1+ dorsalis pedis/posterior tibialis pulses. no clubbing, cyanosis, significant edema, Musculoskeletal normal gait and posture. no significant deformity or arthritic changes, no loss or range of motion, no clubbing. Psychiatric this patient is able to make decisions and demonstrates good insight into disease process. Alert and Oriented x 3. pleasant and cooperative. General Notes: Upon inspection patient's wound bed actually showed signs of noted on the surface of the wound. I am not exactly sure how far this is good to really extend when it is all said and done. Right now we really do not have an indication of that. I did trim away some of the blister tissue around the edges of the wound which the patient tolerated without any pain today and post debridement the wound bed appears to be doing well at this point except for she does need some debridement here of the superficial surface. I am going to use Santyl at this point in order to begin the debridement process. I think that is good to be optimal. Integumentary (Hair, Skin) Wound #1 status is Open. Original cause of wound was Gradually Appeared. The wound is located on the Left,Lateral Foot. The wound measures 1.2cm length x 2cm width x 0.1cm depth; 1.885cm^2 area and 0.188cm^3 volume. There is Fat Layer (Subcutaneous Tissue) Exposed exposed. There is no tunneling or undermining noted. There is a medium amount of serous drainage noted. The wound margin is flat and intact. There is medium  (34-66%) granulation within the wound bed. There is a medium (34-66%) amount of necrotic tissue within the wound bed including Adherent Slough. Assessment Active Problems ICD-10 Type 2 diabetes mellitus with foot ulcer Non-pressure chronic ulcer of other part of left foot with fat layer exposed Cellulitis of left lower limb Procedures Wound #1 Pre-procedure diagnosis of Wound #1 is a Diabetic Wound/Ulcer of the Lower Extremity located on the Left,Lateral Foot .Severity of Tissue Pre Debridement is: Fat layer exposed. There was a Selective/Open Wound Skin/Dermis Debridement with a total area of 2.4 sq cm performed by [name] ([id number]) STONE III, Ugochi Henzler E., PA-C. With the following instrument(s): Forceps, and Scissors to remove Non-Viable tissue/material. Material removed includes Callus and Skin: Dermis and after achieving pain control using  Lidocaine. A time out was conducted at 13:46, prior to the start of the procedure. There was no bleeding. The procedure was tolerated well. Post Debridement Measurements: 1.2cm length x 2cm width x 0.1cm depth; 0.188cm^3 volume. Character of Wound/Ulcer Post Debridement is stable. Severity of Tissue Post Debridement is: Fat layer exposed. Post procedure Diagnosis Wound #1: Same as Pre-Procedure Plan Wound Cleansing: Wound #1 Left,Lateral Foot: Clean wound with Normal Saline. Primary Wound Dressing: Wound #1 Left,Lateral Foot: Santyl Ointment Secondary Dressing: Saline moistened gauze [place] Dressing Change Frequency: Wound #1 Left,Lateral Foot: Change dressing every day. Follow-up Appointments: Wound #1 Left,Lateral Foot: Return Appointment in 1 week. Off-Loading: Open toe surgical shoe with peg assist. Consults ordered were: Vascular - Arterial, ABI, TBI The following medication(s) was prescribed: Santyl topical 250 unit/gram ointment ointment topical Apply nickel thick daily to the wound bed and then cover with a dressing as  directed in clinic starting 03/24/2020 1. I did recommend treatment with Santyl we did get that initiated for the patient today and they are to be changing this daily at home and showed her how to perform the dressing change. 2. I am also going to recommend that we go ahead and utilize an offloading shoe in order to help with pressure relief in this area. 3. I am also can recommend an arterial study with ABI and TBI to confirm that we have sufficient blood flow in order to proceed with more aggressive debridement as necessary. 4. I would recommend that the patient continue to monitor for any signs of worsening infection I sent in the [facility] today I have not extended any antibiotics but again if the infection seems to return she has increased pain or any other complications we may need to consider this. We will see patient back for reevaluation in 1 week here in the clinic. If anything worsens or changes patient will contact our office for additional recommendations. Electronic Signature(s) Signed: 03/24/2020 4:52:00 PM By: Worthy Keeler PA-C Previous Signature: 03/24/2020 4:50:52 PM Version By: Worthy Keeler PA-C Entered By: Worthy Keeler on 03/24/2020 16:52:00 [name] ([id number]) -------------------------------------------------------------------------------- ROS/PFSH Details Patient Name: [name] Date of Service: 03/24/2020 12:45 PM Medical Record Number: [id number] Patient Account Number: [id number] Date of Birth/Sex: 1970/12/14 (49 y.o. Female) Treating RN: Cornell Barman Primary Care Provider: Tomasa Hose Other Clinician: Referring Provider: Tomasa Hose Treating Provider/Extender: Melburn Hake, Neela Zecca Weeks in Treatment: 0 Information Obtained From Patient Ear/Nose/Mouth/Throat Complaints and Symptoms: Negative for: Difficult clearing ears; Sinusitis Hematologic/Lymphatic Complaints and Symptoms: Negative for: Bleeding / Clotting Disorders; Human Immunodeficiency  Virus Respiratory Complaints and Symptoms: Negative for: Chronic or frequent coughs; Shortness of Breath Cardiovascular Complaints and Symptoms: Negative for: Chest pain; LE edema Gastrointestinal Complaints and Symptoms: Negative for: Frequent diarrhea; Nausea; Vomiting Genitourinary Complaints and Symptoms: Negative for: Kidney failure/ Dialysis; Incontinence/dribbling Immunological Complaints and Symptoms: Negative for: Hives; Itching Integumentary (Skin) Complaints and Symptoms: Positive for: Wounds Negative for: Bleeding or bruising tendency Musculoskeletal Complaints and Symptoms: Negative for: Muscle Pain; Muscle Weakness Psychiatric Complaints and Symptoms: Positive for: Anxiety; Claustrophobia Eyes Medical History: Positive for: Cataracts - April 19, 2020 Past Medical History Notes: TAELYR, JANTZ ([id number]) Diabetic Retinopathy Endocrine Medical History: Positive for: Type II Diabetes - 15 years Time with diabetes: 31 + years Treated with: Insulin, Oral agents Blood sugar tested every day: Yes Tested : 4 Blood sugar testing results: Breakfast: 143 Neurologic Medical History: Positive for: Neuropathy Oncologic HBO Extended History Items Eyes: Cataracts Immunizations Pneumococcal Vaccine:  Received Pneumococcal Vaccination: No Implantable Devices None Hospitalization / Surgery History Type of Hospitalization/Surgery DKA Family and Social History Cancer: No; Diabetes: Yes - Father,Maternal Grandparents,Paternal Grandparents; Current every day smoker - 1/2 pack daily; Marital Status - Single; Alcohol Use: Never; Drug Use: No History; Caffeine Use: Daily Electronic Signature(s) Signed: 03/24/2020 4:55:37 PM By: Worthy Keeler PA-C Signed: 03/24/2020 5:43:28 PM By: Gretta Cool, BSN, RN, CWS, Kim RN, BSN Entered By: Gretta Cool, BSN, RN, CWS, Kim on 03/24/2020 13:05:45 JONE, PANEBIANCO  ([id number]) -------------------------------------------------------------------------------- SuperBill Details Patient Name: [name] Date of Service: 03/24/2020 Medical Record Number: [id number] Patient Account Number: [id number] Date of Birth/Sex: 08-11-1971 (49 y.o. Female) Treating RN: Army Melia Primary Care Provider: Tomasa Hose Other Clinician: Referring Provider: Tomasa Hose Treating Provider/Extender: Melburn Hake, Porfirio Bollier Weeks in Treatment: 0 Diagnosis Coding ICD-10 Codes Code Description E11.621 Type 2 diabetes mellitus with foot ulcer L97.522 Non-pressure chronic ulcer of other part of left foot with fat layer exposed L03.116 Cellulitis of left lower limb Facility Procedures CPT4 Code: [id number] Description: 99214 - WOUND CARE VISIT-LEV 4 EST PT Modifier: Quantity: 1 CPT4 Code: [id number] Description: 37858 - DEBRIDE WOUND 1ST 20 SQ CM OR < Modifier: Quantity: 1 CPT4 Code: Description: ICD-10 Diagnosis Description L97.522 Non-pressure chronic ulcer of other part of left foot with fat layer expo Modifier: sed Quantity: Physician Procedures CPT4 Code: 8502774 Description: 12878 - WC PHYS LEVEL 4 - NEW PT Modifier: 25 Quantity: 1 CPT4 Code: Description: ICD-10 Diagnosis Description E11.621 Type 2 diabetes mellitus with foot ulcer L97.522 Non-pressure chronic ulcer of other part of left foot with fat layer expo L03.116 Cellulitis of left lower limb Modifier: sed Quantity: CPT4 Code: [id number] Description: 97597 - WC PHYS DEBR WO ANESTH 20 SQ CM Modifier: Quantity: 1 CPT4 Code: Description: ICD-10 Diagnosis Description L97.522 Non-pressure chronic ulcer of other part of left foot with fat layer expo Modifier: sed Quantity: Electronic Signature(s) Signed: 03/24/2020 4:52:13 PM By: Worthy Keeler PA-C Previous Signature: 03/24/2020 4:51:16 PM Version By: Worthy Keeler PA-C Entered By: Worthy Keeler on 03/24/2020 16:52:12

## 2020-03-31 ENCOUNTER — Other Ambulatory Visit: Payer: Self-pay

## 2020-03-31 ENCOUNTER — Encounter: Payer: Medicaid Other | Admitting: Internal Medicine

## 2020-03-31 DIAGNOSIS — E11621 Type 2 diabetes mellitus with foot ulcer: Secondary | ICD-10-CM | POA: Diagnosis not present

## 2020-03-31 NOTE — Progress Notes (Signed)
Crystal, Hayes ([id number]) Visit Report for 03/31/2020 HPI Details Patient Name: [name], [name]. Date of Service: 03/31/2020 8:30 AM Medical Record Number: [id number] Patient Account Number: [id number] Date of Birth/Sex: Apr 04, 1971 (49 y.o. F) Treating RN: Army Melia Primary Care Provider: Tomasa Hose Other Clinician: Referring Provider: Tomasa Hose Treating Provider/Extender: Tito Dine in Treatment: 1 History of Present Illness HPI Description: 03/24/2020 upon evaluation today patient presents for initial inspection here in our clinic concerning the wound on her left lateral foot and the fifth metatarsal region which came up about 2 weeks ago. Subsequently the patient has been seen by her primary care provider who did place her on Bactrim that does seem to have been beneficial. She does have a hemoglobin of 11.1 which was measured on 03/02/2020. She was in the hospital right around that time due to diabetic ketoacidosis for 7 days. Apparently this was due to medication change. At this point she seems to be doing somewhat better which is great news. Fortunately there is no signs of active infection at this time. Patient's culture grew mixed flora that was no definitive diagnosis here as far as the bacteria involved. However the Bactrim does seem to be helping her pain is also greatly improved. Currently the been doing vinegar water soaks along with Dakin's applied to the wound afterwards this is what was used on her heel previous whenever she had an issue. Subsequently the patient has not had vascular evaluation unfortunately her ABIs were somewhat unusual we could not really get a good reading today therefore I question whether she has good arterial flow for that reason I would avoid any aggressive debridement right now until we can get her in for an arterial study with ABI and TBI. She is seen with her sister in the office today as well. 7/15; patient has her arterial studies  booked for 7/21 at [facility]. She is using Santyl to the wound. Electronic Signature(s) Signed: 03/31/2020 11:52:22 AM By: Linton Ham MD Entered By: Linton Ham on 03/31/2020 95:28:41 [name] ([id number]) -------------------------------------------------------------------------------- Physical Exam Details Patient Name: [name] Date of Service: 03/31/2020 8:30 AM Medical Record Number: [id number] Patient Account Number: [id number] Date of Birth/Sex: 1971-03-20 (49 y.o. F) Treating RN: Army Melia Primary Care Provider: Tomasa Hose Other Clinician: Referring Provider: Tomasa Hose Treating Provider/Extender: Ricard Dillon Weeks in Treatment: 1 Constitutional Sitting or standing Blood Pressure is within target range for patient.. Pulse regular and within target range for patient.Marland Kitchen Respirations regular, non- labored and within target range.. Temperature is normal and within the target range for the patient.Marland Kitchen appears in no distress. Cardiovascular Femoral pulses palpable on the left. Faint dorsalis pedis pulse. I cannot feel a posterior tibial pulse or a popliteal.. Notes Wound exam; left fifth metatarsal head. This is almost 100% covered with an adherent nonviable surface. No evidence of infection around the wound although the skin is somewhat macerated Electronic Signature(s) Signed: 03/31/2020 11:52:22 AM By: Linton Ham MD Entered By: Linton Ham on 03/31/2020 09:29:38 [name] ([id number]) -------------------------------------------------------------------------------- Physician Orders Details Patient Name: [name] Date of Service: 03/31/2020 8:30 AM Medical Record Number: [id number] Patient Account Number: [id number] Date of Birth/Sex: 1971-06-17 (49 y.o. F) Treating RN: Army Melia Primary Care Provider: Tomasa Hose Other Clinician: Referring Provider: Tomasa Hose Treating Provider/Extender: Tito Dine  in Treatment: 1 Verbal / Phone Orders: No Diagnosis Coding Wound Cleansing Wound #1 Left,Lateral Foot o Clean wound with Normal Saline. Primary Wound  Dressing Wound #1 [place] Dressing Change Frequency Wound #1 Left,Lateral Foot o Change dressing every day. Follow-up Appointments Wound #1 Left,Lateral Foot o Return Appointment in 1 week. Off-Loading o Open toe surgical shoe with peg assist. Electronic Signature(s) Signed: 03/31/2020 11:42:50 AM By: Army Melia Signed: 03/31/2020 11:52:22 AM By: Linton Ham MD Entered By: Army Melia on 03/31/2020 09:25:40 COY, VANDOREN ([id number]) -------------------------------------------------------------------------------- Problem List Details Patient Name: [name] Date of Service: 03/31/2020 8:30 AM Medical Record Number: [id number] Patient Account Number: [id number] Date of Birth/Sex: [birth year] (49 y.o. 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  F) Treating RN: Army Melia Primary Care Provider: Tomasa Hose Other Clinician: Referring Provider: Tomasa Hose Treating Provider/Extender: Tito Dine in Treatment: 1 Subjective History of Present Illness (HPI) 03/24/2020 upon evaluation today patient presents for initial inspection here in our clinic concerning the wound on her left lateral foot and the fifth metatarsal region which came up about 2 weeks ago. Subsequently the patient has been seen by her primary care provider who did place her on Bactrim that does seem to have been beneficial. She does have a hemoglobin of 11.1 which was measured on 03/02/2020. She was in the hospital right around that time due to diabetic ketoacidosis for 7 days. Apparently this was due to medication change. At this point she seems to be doing somewhat better which is great news. Fortunately there is no signs of active infection at this time. Patient's culture grew mixed flora that was no definitive diagnosis here as far as the bacteria involved. However the Bactrim does seem to be helping her pain is also greatly improved. Currently the been doing vinegar water soaks along with Dakin's applied to the wound afterwards this is what was used on her heel previous whenever she had an issue. Subsequently the patient has not had vascular evaluation unfortunately her ABIs were somewhat unusual we could not really get a good reading today therefore I question whether she has good arterial flow for that reason I would avoid any aggressive debridement right now until we can get her in for an arterial study with ABI and TBI. She is seen with her sister in the office today as well. 7/15; patient has her arterial studies booked for 7/21 at [facility]. She is using Santyl to the wound. Objective Constitutional Sitting or standing Blood Pressure is within target range for patient.. Pulse regular and within target range for patient.Marland Kitchen Respirations  regular, non- labored and within target range.. Temperature is normal and within the target range for the patient.Marland Kitchen appears in no distress. Vitals Time Taken: 8:52 AM, Height: 64 in, Weight: 142 lbs, BMI: 24.4, Temperature: 98.2 F, Pulse: 78 bpm, Respiratory Rate: 12 breaths/min, Blood Pressure: 110/70 mmHg.  Cardiovascular Femoral pulses palpable on the left. Faint dorsalis pedis pulse. I cannot feel a posterior tibial pulse or a popliteal.. General Notes: Wound exam; left fifth metatarsal head. This is almost 100% covered with an adherent nonviable surface. No evidence of infection around the wound although the skin is somewhat macerated Integumentary (Hair, Skin) Wound #1 status is Open. Original cause of wound was Gradually Appeared. The wound is located on the Left,Lateral Foot. The wound measures 1.2cm length x 1.8cm width x 0.1cm depth; 1.696cm^2 area and 0.17cm^3 volume. There is Fat Layer (Subcutaneous Tissue) Exposed exposed. There is no tunneling or undermining noted. There is a medium amount of serous drainage noted. The wound margin is flat and intact. There is small (1-33%) granulation within the wound bed. There is a large (67-100%) amount of necrotic tissue within the wound bed including Eschar and Adherent Slough. Assessment Active Problems ICD-10 Type 2 diabetes mellitus with foot ulcer Non-pressure chronic ulcer of other part of left foot with fat layer exposed Cellulitis of left lower limb RUFINA, KIMERY. ([id number]) Plan Wound Cleansing: Wound #1 Left,Lateral Foot: Clean wound with Normal Saline. Primary Wound Dressing: Wound #1 Left,Lateral Foot: Santyl Ointment Secondary Dressing: [place] Dressing Change Frequency: Wound #1 Left,Lateral Foot: Change dressing every day. Follow-up Appointments: Wound #1 Left,Lateral Foot: Return Appointment in 1 week. Off-Loading: Open toe surgical shoe with peg assist. 1. We will continue with the Santyl and border  foam change daily 2. 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 It is difficult to get a sense of her degree of claudication. She has significant visual issues does not walk all that much etc. 3. She also has diabetic neuropathy. 4. No debridement is indicated until we are able to look at the ABIs, TBI's and arterial Dopplers. Based on this she may need to see vascular surgery as well Electronic Signature(s) Signed: 03/31/2020 11:52:22 AM By: Linton Ham MD Entered By: Linton Ham on 03/31/2020 09:31:10 [name] ([id number]) -------------------------------------------------------------------------------- SuperBill Details Patient Name: [name] Date of Service: 03/31/2020 Medical Record Number: [id number] Patient Account Number: [id number] Date of Birth/Sex: [birth year] (50 y.o. F) Treating RN: Army Melia Primary Care Provider: Tomasa Hose Other Clinician: Referring Provider: Tomasa Hose Treating Provider/Extender: Tito Dine in Treatment: 1 Diagnosis Coding ICD-10 Codes Code Description E11.621 Type 2 diabetes mellitus with foot ulcer L97.522 Non-pressure chronic ulcer of other part of left foot with fat layer exposed L03.116 Cellulitis of left lower limb Facility Procedures CPT4 Code: [id number] Description: 30092 - WOUND CARE VISIT-LEV 3 EST PT Modifier: Quantity: 1 Physician Procedures CPT4 Code: [id number] Description: 26333 - WC PHYS LEVEL 3 - EST PT Modifier: Quantity: 1 CPT4 Code: Description: ICD-10 Diagnosis Description E11.621 Type 2 diabetes mellitus with foot ulcer L97.522 Non-pressure chronic ulcer of other part of left foot with fat layer ex L03.116 Cellulitis of left lower limb Modifier: posed Quantity: Electronic Signature(s) Signed: 03/31/2020 11:52:22 AM By: Linton Ham MD Entered By: Linton Ham on 03/31/2020 09:31:33

## 2020-03-31 NOTE — Progress Notes (Signed)
ANGELYS, YETMAN ([id number]) Visit Report for 03/31/2020 Arrival Information Details Patient Name: [name], [name]. Date of Service: 03/31/2020 8:30 AM Medical Record Number: [id number] Patient Account Number: [id number] Date of Birth/Sex: 1971-04-15 (49 y.o. F) Treating RN: Army Melia Primary Care Shayleigh Bouldin: Tomasa Hose Other Clinician: Referring Jahzara Slattery: Tomasa Hose Treating Lorilee Cafarella/Extender: Tito Dine in Treatment: 1 Visit Information History Since Last Visit Added or deleted any medications: No Patient Arrived: Ambulatory Had a fall or experienced change in No Arrival Time: 08:51 activities of daily living that may affect Accompanied By: self risk of falls: Transfer Assistance: None Has Dressing in Place as Prescribed: Yes Pain Present Now: Yes Electronic Signature(s) Signed: 03/31/2020 11:29:53 AM By: Grover Canavan Entered By: Grover Canavan on 03/31/2020 08:52:01 [name] ([id number]) -------------------------------------------------------------------------------- Clinic Level of Care Assessment Details Patient Name: [name] Date of Service: 03/31/2020 8:30 AM Medical Record Number: [id number] Patient Account Number: [id number] Date of Birth/Sex: 08-May-1971 (49 y.o. F) Treating RN: Army Melia Primary Care Lowell Mcgurk: Tomasa Hose Other Clinician: Referring Zayonna Ayuso: Tomasa Hose Treating Freedom Lopezperez/Extender: Tito Dine in Treatment: 1 Clinic Level of Care Assessment Items TOOL 4 Quantity Score []  - Use when only an EandM is performed on FOLLOW-UP visit 0 ASSESSMENTS - Nursing Assessment / Reassessment X - Reassessment of Co-morbidities (includes updates in patient status) 1 10 X- 1 5 Reassessment of Adherence to Treatment Plan ASSESSMENTS - Wound and Skin Assessment / Reassessment X - Simple Wound Assessment / Reassessment - one wound 1 5 []  - 0 Complex Wound Assessment / Reassessment - multiple wounds []  - 0 Dermatologic /  Skin Assessment (not related to wound area) ASSESSMENTS - Focused Assessment []  - Circumferential Edema Measurements - multi extremities 0 []  - 0 Nutritional Assessment / Counseling / Intervention X- 1 5 Lower Extremity Assessment (monofilament, tuning fork, pulses) []  - 0 Peripheral Arterial Disease Assessment (using hand held doppler) ASSESSMENTS - Ostomy and/or Continence Assessment and Care []  - Incontinence Assessment and Management 0 []  - 0 Ostomy Care Assessment and Management (repouching, etc.) PROCESS - Coordination of Care X - Simple Patient / Family Education for ongoing care 1 15 []  - 0 Complex (extensive) Patient / Family Education for ongoing care []  - 0 Staff obtains Programmer, systems, Records, Test Results / Process Orders []  - 0 Staff telephones HHA, Nursing Homes / Clarify orders / etc []  - 0 Routine Transfer to another Facility (non-emergent condition) []  - 0 Routine Hospital Admission (non-emergent condition) []  - 0 New Admissions / Biomedical engineer / Ordering NPWT, Apligraf, etc. []  - 0 Emergency Hospital Admission (emergent condition) X- 1 10 Simple Discharge Coordination []  - 0 Complex (extensive) Discharge Coordination PROCESS - Special Needs []  - Pediatric / Minor Patient Management 0 []  - 0 Isolation Patient Management []  - 0 Hearing / Language / Visual special needs []  - 0 Assessment of Community assistance (transportation, D/C planning, etc.) []  - 0 Additional assistance / Altered mentation []  - 0 Support Surface(s) Assessment (bed, cushion, seat, etc.) INTERVENTIONS - Wound Cleansing / Measurement [name], [name]. ([id number]) X- 1 5 Simple Wound Cleansing - one wound []  - 0 Complex Wound Cleansing - multiple wounds X- 1 5 Wound Imaging (photographs - any number of wounds) []  - 0 Wound Tracing (instead of photographs) X- 1 5 Simple Wound Measurement - one wound []  - 0 Complex Wound Measurement - multiple wounds INTERVENTIONS - Wound  Dressings []  - Small Wound Dressing one or multiple wounds 0 X- 1 15 Medium  Wound Dressing one or multiple wounds []  - 0 Large Wound Dressing one or multiple wounds []  - 0 Application of Medications - topical []  - 0 Application of Medications - injection INTERVENTIONS - Miscellaneous []  - External ear exam 0 []  - 0 Specimen Collection (cultures, biopsies, blood, body fluids, etc.) []  - 0 Specimen(s) / Culture(s) sent or taken to Lab for analysis []  - 0 Patient Transfer (multiple staff / Civil Service fast streamer / Similar devices) []  - 0 Simple Staple / Suture removal (25 or less) []  - 0 Complex Staple / Suture removal (26 or more) []  - 0 Hypo / Hyperglycemic Management (close monitor of Blood Glucose) []  - 0 Ankle / Brachial Index (ABI) - do not check if billed separately X- 1 5 Vital Signs Has the patient been seen at the hospital within the last three years: Yes Total Score: 85 Level Of Care: New/Established - Level 3 Electronic Signature(s) Signed: 03/31/2020 11:42:50 AM By: Army Melia Entered By: Army Melia on 03/31/2020 09:26:13 [name] ([id number]) -------------------------------------------------------------------------------- Encounter Discharge Information Details Patient Name: [name] Date of Service: 03/31/2020 8:30 AM Medical Record Number: [id number] Patient Account Number: [id number] Date of Birth/Sex: 1970/09/25 (49 y.o. F) Treating RN: Army Melia Primary Care Ousman Dise: Tomasa Hose Other Clinician: Referring Clytee Heinrich: Tomasa Hose Treating Aron Inge/Extender: Tito Dine in Treatment: 1 Encounter Discharge Information Items Discharge Condition: Stable Ambulatory Status: Ambulatory Discharge Destination: Home Transportation: Private Auto Accompanied By: sister Schedule Follow-up Appointment: Yes Clinical Summary of Care: Electronic Signature(s) Signed: 03/31/2020 11:42:50 AM By: Army Melia Entered By: Army Melia on 03/31/2020  09:27:55 [name] ([id number]) -------------------------------------------------------------------------------- Lower Extremity Assessment Details Patient Name: [name] Date of Service: 03/31/2020 8:30 AM Medical Record Number: [id number] Patient Account Number: [id number] Date of Birth/Sex: 1971-07-29 (49 y.o. F) Treating RN: Army Melia Primary Care Mikenna Bunkley: Tomasa Hose Other Clinician: Referring Caetano Oberhaus: Tomasa Hose Treating Mihir Flanigan/Extender: Ricard Dillon Weeks in Treatment: 1 Edema Assessment Assessed: [Left: Yes] [Right: No] Edema: [Left: N] [Right: o] Electronic Signature(s) Signed: 03/31/2020 11:29:53 AM By: Grover Canavan Signed: 03/31/2020 11:42:50 AM By: Army Melia Entered By: Grover Canavan on 03/31/2020 09:01:03 [name] ([id number]) -------------------------------------------------------------------------------- Multi Wound Chart Details Patient Name: [name] Date of Service: 03/31/2020 8:30 AM Medical Record Number: [id number] Patient Account Number: [id number] Date of Birth/Sex: 1971/03/30 (49 y.o. F) Treating RN: Army Melia Primary Care Shundra Wirsing: Tomasa Hose Other Clinician: Referring Jaclyn Carew: Tomasa Hose Treating Chia Rock/Extender: Tito Dine in Treatment: 1 Vital Signs Height(in): 64 Pulse(bpm): 53 Weight(lbs): 142 Blood Pressure(mmHg): 110/70 Body Mass Index(BMI): 24 Temperature(F): 98.2 Respiratory Rate(breaths/min): 12 Photos: [N/A:N/A] Wound Location: Left, Lateral Foot N/A N/A Wounding Event: Gradually Appeared N/A N/A Primary Etiology: Diabetic Wound/Ulcer of the Lower N/A N/A Extremity Comorbid History: Cataracts, Type II Diabetes, N/A N/A Neuropathy Date Acquired: 03/13/2020 N/A N/A Weeks of Treatment: 1 N/A N/A Wound Status: Open N/A N/A Measurements L x W x D (cm) 1.2x1.8x0.1 N/A N/A Area (cm) : 1.696 N/A N/A Volume (cm) : 0.17 N/A N/A % Reduction in Area: 10.00% N/A N/A %  Reduction in Volume: 9.60% N/A N/A Classification: Grade 1 N/A N/A Exudate Amount: Medium N/A N/A Exudate Type: Serous N/A N/A Exudate Color: amber N/A N/A Wound Margin: Flat and Intact N/A N/A Granulation Amount: Small (1-33%) N/A N/A Necrotic Amount: Large (67-100%) N/A N/A Necrotic Tissue: Eschar, Adherent Slough N/A N/A Exposed Structures: Fat Layer (Subcutaneous Tissue) N/A N/A Exposed: Yes Fascia: No Tendon: No Muscle: No Joint: No Bone:  No Epithelialization: None N/A N/A Treatment Notes Wound #1 (Left, Lateral Foot) Notes Santyl, telfa [address], [place]. ([id number]) Electronic Signature(s) Signed: 03/31/2020 11:52:22 AM By: Linton Ham MD Entered By: Linton Ham on 03/31/2020 09:27:52 [name], [name] ([id number]) -------------------------------------------------------------------------------- [place] Details Patient Name: [name] Date of Service: 03/31/2020 8:30 AM Medical Record Number: [id number] Patient Account Number: [id number] Date of Birth/Sex: [birth year] (49 y.o. F) Treating RN: Army Melia Primary Care Yasmene Salomone: Tomasa Hose Other Clinician: Referring Tamar Miano: Tomasa Hose Treating Brason Berthelot/Extender: Tito Dine in Treatment: 1 Active Inactive Orientation to the Wound Care Program Nursing Diagnoses: Knowledge deficit related to the wound healing center program Goals: Patient/caregiver will verbalize understanding of the [facility] Program Date Initiated: 03/24/2020 Target Resolution Date: 04/22/2020 Goal Status: Active Interventions: Provide education on orientation to the wound center Notes: Wound/Skin Impairment Nursing Diagnoses: Impaired tissue integrity Goals: Ulcer/skin breakdown will have a volume reduction of 30% by week 4 Date Initiated: 03/24/2020 Target Resolution Date: 04/22/2020 Goal Status: Active Interventions: Assess ulceration(s) every visit Notes: Electronic  Signature(s) Signed: 03/31/2020 11:42:50 AM By: Army Melia Entered By: Army Melia on 03/31/2020 09:22:20 [name] ([id number]) -------------------------------------------------------------------------------- Pain Assessment Details Patient Name: [name] Date of Service: 03/31/2020 8:30 AM Medical Record Number: [id number] Patient Account Number: [id number] Date of Birth/Sex: 1970/11/17 (49 y.o. F) Treating RN: Army Melia Primary Care Madalyne Husk: Tomasa Hose Other Clinician: Referring Belva Koziel: Tomasa Hose Treating Ashleigh Arya/Extender: Tito Dine in Treatment: 1 Active Problems Location of Pain Severity and Description of Pain Patient Has Paino Yes Site Locations Duration of the Pain. Constant / Intermittento Constant Rate the pain. Current Pain Level: 3 Pain Management and Medication Current Pain Management: Rest: Yes Electronic Signature(s) Signed: 03/31/2020 11:29:53 AM By: Grover Canavan Signed: 03/31/2020 11:42:50 AM By: Army Melia Entered By: Grover Canavan on 03/31/2020 08:55:44 [name] ([id number]) -------------------------------------------------------------------------------- Patient/Caregiver Education Details Patient Name: [name] Date of Service: 03/31/2020 8:30 AM Medical Record Number: [id number] Patient Account Number: [id number] Date of Birth/Gender: 09/20/1970 (49 y.o. F) Treating RN: Army Melia Primary Care Physician: Tomasa Hose Other Clinician: Referring Physician: Tomasa Hose Treating Physician/Extender: Tito Dine in Treatment: 1 Education Assessment Education Provided To: Patient Education Topics Provided Wound/Skin Impairment: Handouts: Caring for Your Ulcer Methods: Demonstration, Explain/Verbal Responses: State content correctly Electronic Signature(s) Signed: 03/31/2020 11:42:50 AM By: Army Melia Entered By: Army Melia on 03/31/2020 09:27:25 [name]  ([id number]) -------------------------------------------------------------------------------- Wound Assessment Details Patient Name: [name] Date of Service: 03/31/2020 8:30 AM Medical Record Number: [id number] Patient Account Number: [id number] Date of Birth/Sex: 12-12-1970 (49 y.o. F) Treating RN: Army Melia Primary Care Yoshi Mancillas: Tomasa Hose Other Clinician: Referring Apphia Cropley: Tomasa Hose Treating Marva Hendryx/Extender: Tito Dine in Treatment: 1 Wound Status Wound Number: 1 Primary Etiology: Diabetic Wound/Ulcer of the Lower Extremity Wound Location: Left, Lateral Foot Wound Status: Open Wounding Event: Gradually Appeared Comorbid History: Cataracts, Type II Diabetes, Neuropathy Date Acquired: 03/13/2020 Weeks Of Treatment: 1 Clustered Wound: No Photos Wound Measurements Length: (cm) 1.2 Width: (cm) 1.8 Depth: (cm) 0.1 Area: (cm) 1.696 Volume: (cm) 0.17 % Reduction in Area: 10% % Reduction in Volume: 9.6% Epithelialization: None Tunneling: No Undermining: No Wound Description Classification: Grade 1 F Wound Margin: Flat and Intact S Exudate Amount: Medium Exudate Type: Serous Exudate Color: amber oul Odor After Cleansing: No lough/Fibrino Yes Wound Bed Granulation Amount: Small (1-33%) Exposed Structure Necrotic Amount: Large (67-100%) Fascia Exposed: No Necrotic Quality: Eschar, Adherent Slough  Fat Layer (Subcutaneous Tissue) Exposed: Yes Tendon Exposed: No Muscle Exposed: No Joint Exposed: No Bone Exposed: No Treatment Notes Wound #1 (Left, Lateral Foot) Notes Santyl, telfa [employer]) Signed: 03/31/2020 11:29:53 AM By: Edwyna Shell, Nance Pew ([id number]) Signed: 03/31/2020 11:42:50 AM By: Army Melia Entered By: Grover Canavan on 03/31/2020 09:00:30 [name] ([id number]) -------------------------------------------------------------------------------- Vitals Details Patient Name: [name] Date of Service: 03/31/2020 8:30 AM Medical Record Number: [id number] Patient Account Number: [id number] Date of Birth/Sex: 09-28-1970 (49 y.o. F) Treating RN: Army Melia Primary Care Alzada Brazee: Tomasa Hose Other Clinician: Referring Jamine Highfill: Tomasa Hose Treating Ayzia Day/Extender: Tito Dine in Treatment: 1 Vital Signs Time Taken: 08:52 Temperature (F): 98.2 Height (in): 64 Pulse (bpm): 78 Weight (lbs): 142 Respiratory Rate (breaths/min): 12 Body Mass Index (BMI): 24.4 Blood Pressure (mmHg): 110/70 Reference Range: 80 - 120 mg / dl Electronic Signature(s) Signed: 03/31/2020 11:29:53 AM By: Grover Canavan Entered By: Grover Canavan on 03/31/2020 08:55:15

## 2020-04-05 ENCOUNTER — Other Ambulatory Visit (INDEPENDENT_AMBULATORY_CARE_PROVIDER_SITE_OTHER): Payer: Self-pay | Admitting: Physician Assistant

## 2020-04-05 DIAGNOSIS — L97522 Non-pressure chronic ulcer of other part of left foot with fat layer exposed: Secondary | ICD-10-CM

## 2020-04-05 DIAGNOSIS — L03116 Cellulitis of left lower limb: Secondary | ICD-10-CM

## 2020-04-06 ENCOUNTER — Ambulatory Visit (INDEPENDENT_AMBULATORY_CARE_PROVIDER_SITE_OTHER): Payer: Medicaid Other

## 2020-04-06 ENCOUNTER — Other Ambulatory Visit: Payer: Self-pay

## 2020-04-06 DIAGNOSIS — L03116 Cellulitis of left lower limb: Secondary | ICD-10-CM

## 2020-04-06 DIAGNOSIS — L97522 Non-pressure chronic ulcer of other part of left foot with fat layer exposed: Secondary | ICD-10-CM | POA: Diagnosis not present

## 2020-04-07 ENCOUNTER — Encounter: Payer: Medicaid Other | Admitting: Physician Assistant

## 2020-04-07 DIAGNOSIS — E11621 Type 2 diabetes mellitus with foot ulcer: Secondary | ICD-10-CM | POA: Diagnosis not present

## 2020-04-07 NOTE — Progress Notes (Addendum)
[name] ([id number]) Visit Report for 04/07/2020 Chief Complaint Document Details Patient Name: [name]. Date of Service: 04/07/2020 2:00 PM Medical Record Number: [id number] Patient Account Number: [id number] Date of Birth/Sex: Dec 16, 1970 (49 y.o. F) Treating RN: Army Melia Primary Care Provider: Tomasa Hose Other Clinician: Referring Provider: Tomasa Hose Treating Provider/Extender: Melburn Hake, Nikolis Berent Weeks in Treatment: 2 Information Obtained from: Patient Chief Complaint Left Foot Ulcer Electronic Signature(s) Signed: 04/07/2020 1:54:17 PM By: Worthy Keeler PA-C Entered By: Worthy Keeler on 04/07/2020 13:54:16 [name] ([id number]) -------------------------------------------------------------------------------- HPI Details Patient Name: [name] Date of Service: 04/07/2020 2:00 PM Medical Record Number: [id number] Patient Account Number: [id number] Date of Birth/Sex: August 23, 1971 (49 y.o. F) Treating RN: Army Melia Primary Care Provider: Tomasa Hose Other Clinician: Referring Provider: Tomasa Hose Treating Provider/Extender: Melburn Hake, Khara Renaud Weeks in Treatment: 2 History of Present Illness HPI Description: 03/24/2020 upon evaluation today patient presents for initial inspection here in our clinic concerning the wound on her left lateral foot and the fifth metatarsal region which came up about 2 weeks ago. Subsequently the patient has been seen by her primary care provider who did place her on Bactrim that does seem to have been beneficial. She does have a hemoglobin of 11.1 which was measured on 03/02/2020. She was in the hospital right around that time due to diabetic ketoacidosis for 7 days. Apparently this was due to medication change. At this point she seems to be doing somewhat better which is great news. Fortunately there is no signs of active infection at this time. Patient's culture grew mixed flora that was no definitive diagnosis here as far as  the bacteria involved. However the Bactrim does seem to be helping her pain is also greatly improved. Currently the been doing vinegar water soaks along with Dakin's applied to the wound afterwards this is what was used on her heel previous whenever she had an issue. Subsequently the patient has not had vascular evaluation unfortunately her ABIs were somewhat unusual we could not really get a good reading today therefore I question whether she has good arterial flow for that reason I would avoid any aggressive debridement right now until we can get her in for an arterial study with ABI and TBI. She is seen with her sister in the office today as well. 7/15; patient has her arterial studies booked for 7/21 at [facility]. She is using Santyl to the wound. 04/07/2020 upon evaluation today patient appears to be doing poorly at this point with regard to her wound. Unfortunately this appears to be more erythematous than the last time I saw her and I do believe that she needs antibiotics at this time. There does not appear to be any signs of active infection systemically but locally I am very concerned in this regard. She has been having increased pain and tells me that earlier this week it was very significant. There does not appear to be any signs of active infection systemically at this point as mentioned above. No fevers, chills, nausea, vomiting, or diarrhea. The patient's culture from previous that I reviewed from chart review showed a mixed culture of normal flora that did not reveal any pathologic organisms. Nonetheless she was on Bactrim and it did help at that point. She has been off Bactrim for 2 weeks however. Obviously I do feel like this is an ongoing and continuing infection at this point. Electronic Signature(s) Signed: 04/07/2020 2:45:17 PM By: Worthy Keeler PA-C Entered  By: Worthy Keeler on 04/07/2020 14:45:17 [name]  ([id number]) -------------------------------------------------------------------------------- Physical Exam Details Patient Name: [name] Date of Service: 04/07/2020 2:00 PM Medical Record Number: [id number] Patient Account Number: [id number] Date of Birth/Sex: August 21, 1971 (49 y.o. F) Treating RN: Army Melia Primary Care Provider: Tomasa Hose Other Clinician: Referring Provider: Tomasa Hose Treating Provider/Extender: STONE III, Kiron Osmun Weeks in Treatment: 2 Constitutional Well-nourished and well-hydrated in no acute distress. Respiratory normal breathing without difficulty. Psychiatric this patient is able to make decisions and demonstrates good insight into disease process. Alert and Oriented x 3. pleasant and cooperative. Notes Upon inspection patient's wound bed actually showed signs of slough on the surface of the wound she really does need sharp debridement and although her ABIs and TBI's based on what I am seeing today likely would support debridement at a left ABI of 0.87 and a TBI of 0.65 with her being significantly infected at this point with increased erythema spreading out from the wound I am reluctant to perform an aggressive sharp debridement both as a result of the increased pain and could cause as well as the possibility of spreading infection either which I want to risk. I do believe however that we need to clean up the wound bed and I believe Iodoflex could potentially be beneficial in this regard. I did obtain a wound culture today as well she is not currently on any oral antibiotic therapy. Electronic Signature(s) Signed: 04/07/2020 2:46:08 PM By: Worthy Keeler PA-C Entered By: Worthy Keeler on 04/07/2020 14:46:08 [name] ([id number]) -------------------------------------------------------------------------------- Physician Orders Details Patient Name: [name] Date of Service: 04/07/2020 2:00 PM Medical Record Number: [id number] Patient  Account Number: [id number] Date of Birth/Sex: 08/08/1971 (49 y.o. F) Treating RN: Army Melia Primary Care Provider: Tomasa Hose Other Clinician: Referring Provider: Tomasa Hose Treating Provider/Extender: Melburn Hake, Rembert Browe Weeks in Treatment: 2 Verbal / Phone Orders: No Diagnosis Coding ICD-10 Coding Code Description E11.621 Type 2 diabetes mellitus with foot ulcer L97.522 Non-pressure chronic ulcer of other part of left foot with fat layer exposed L03.116 Cellulitis of left lower limb Wound Cleansing Wound #1 Left,Lateral Foot o Clean wound with Normal Saline. Primary Wound Dressing Wound #1 Left,Lateral Foot o Iodoflex Secondary Dressing o ABD and Kerlix/Conform Dressing Change Frequency Wound #1 Left,Lateral Foot o Change dressing every other day. Follow-up Appointments Wound #1 Left,Lateral Foot o Return Appointment in 1 week. Off-Loading o Open toe surgical shoe with peg assist. Laboratory o Bacteria identified in Wound by Culture (MICRO) - left foot oooo LOINC Code: 2536-6 oooo Convenience Name: Wound culture routine Radiology o X-ray, foot - left foot Patient Medications Allergies: No Known Drug Allergies Notifications Medication Indication Start End Bactrim DS 04/07/2020 DOSE 1 - oral 800 mg-160 mg tablet - 1 tablet oral taken 2 times per day for 14 days Electronic Signature(s) GENELLE, ECONOMOU ([id number]) Signed: 04/07/2020 2:47:42 PM By: Worthy Keeler PA-C Entered By: Worthy Keeler on 04/07/2020 14:47:41 [name] ([id number]) -------------------------------------------------------------------------------- Problem List Details Patient Name: [name] Date of Service: 04/07/2020 2:00 PM Medical Record Number: [id number] Patient Account Number: [id number] Date of Birth/Sex: [birth year] (49 y.o. F) Treating RN: Army Melia Primary Care Provider: Tomasa Hose Other Clinician: Referring Provider: Tomasa Hose Treating  Provider/Extender: Melburn Hake, Nehemias Sauceda Weeks in Treatment: 2 Active Problems ICD-10 Encounter Code Description Active Date MDM Diagnosis E11.621 Type 2 diabetes mellitus with foot ulcer 03/24/2020 No Yes L97.522 Non-pressure chronic ulcer of other part of left foot  with fat layer 03/24/2020 No Yes exposed L03.116 Cellulitis of left lower limb 03/24/2020 No Yes Inactive Problems Resolved Problems Electronic Signature(s) Signed: 04/07/2020 1:54:10 PM By: Worthy Keeler PA-C Entered By: Worthy Keeler on 04/07/2020 13:54:10 [name] ([id number]) -------------------------------------------------------------------------------- Progress Note Details Patient Name: [name] Date of Service: 04/07/2020 2:00 PM Medical Record Number: [id number] Patient Account Number: [id number] Date of Birth/Sex: 1971-03-29 (49 y.o. F) Treating RN: Army Melia Primary Care Provider: Tomasa Hose Other Clinician: Referring Provider: Tomasa Hose Treating Provider/Extender: Melburn Hake, Dellene Mcgroarty Weeks in Treatment: 2 Subjective Chief Complaint Information obtained from Patient Left Foot Ulcer History of Present Illness (HPI) 03/24/2020 upon evaluation today patient presents for initial inspection here in our clinic concerning the wound on her left lateral foot and the fifth metatarsal region which came up about 2 weeks ago. Subsequently the patient has been seen by her primary care provider who did place her on Bactrim that does seem to have been beneficial. She does have a hemoglobin of 11.1 which was measured on 03/02/2020. She was in the hospital right around that time due to diabetic ketoacidosis for 7 days. Apparently this was due to medication change. At this point she seems to be doing somewhat better which is great news. Fortunately there is no signs of active infection at this time. Patient's culture grew mixed flora that was no definitive diagnosis here as far as the bacteria involved. However the  Bactrim does seem to be helping her pain is also greatly improved. Currently the been doing vinegar water soaks along with Dakin's applied to the wound afterwards this is what was used on her heel previous whenever she had an issue. Subsequently the patient has not had vascular evaluation unfortunately her ABIs were somewhat unusual we could not really get a good reading today therefore I question whether she has good arterial flow for that reason I would avoid any aggressive debridement right now until we can get her in for an arterial study with ABI and TBI. She is seen with her sister in the office today as well. 7/15; patient has her arterial studies booked for 7/21 at [facility]. She is using Santyl to the wound. 04/07/2020 upon evaluation today patient appears to be doing poorly at this point with regard to her wound. Unfortunately this appears to be more erythematous than the last time I saw her and I do believe that she needs antibiotics at this time. There does not appear to be any signs of active infection systemically but locally I am very concerned in this regard. She has been having increased pain and tells me that earlier this week it was very significant. There does not appear to be any signs of active infection systemically at this point as mentioned above. No fevers, chills, nausea, vomiting, or diarrhea. The patient's culture from previous that I reviewed from chart review showed a mixed culture of normal flora that did not reveal any pathologic organisms. Nonetheless she was on Bactrim and it did help at that point. She has been off Bactrim for 2 weeks however. Obviously I do feel like this is an ongoing and continuing infection at this point. Objective Constitutional Well-nourished and well-hydrated in no acute distress. Vitals Time Taken: 2:13 PM, Height: 64 in, Weight: 142 lbs, BMI: 24.4, Temperature: 98 F, Pulse: 98 bpm, Respiratory Rate: 16 breaths/min, Blood  Pressure: 141/78 mmHg. Respiratory normal breathing without difficulty. Psychiatric this patient is able to make decisions and demonstrates good  insight into disease process. Alert and Oriented x 3. pleasant and cooperative. General Notes: Upon inspection patient's wound bed actually showed signs of slough on the surface of the wound she really does need sharp debridement and although her ABIs and TBI's based on what I am seeing today likely would support debridement at a left ABI of 0.87 and a TBI of 0.65 with her being significantly infected at this point with increased erythema spreading out from the wound I am reluctant to perform an aggressive sharp debridement both as a result of the increased pain and could cause as well as the possibility of spreading infection either which I want to risk. I do believe however that we need to clean up the wound bed and I believe Iodoflex could potentially be beneficial in this regard. I did obtain a wound culture today as well she is not currently on any oral antibiotic therapy. Integumentary (Hair, Skin) Wound #1 status is Open. Original cause of wound was Gradually Appeared. The wound is located on the Left,Lateral Foot. The wound measures 1cm length x 1.2cm width x 0.1cm depth; 0.942cm^2 area and 0.094cm^3 volume. There is Fat Layer (Subcutaneous Tissue) Exposed exposed. There is no tunneling or undermining noted. There is a medium amount of serous drainage noted. The wound margin is flat and intact. There is small (1-33%) granulation within the wound bed. There is a large (67-100%) amount of necrotic tissue within the wound bed including Eschar and KERIANA, SARSFIELD. ([id number]) [place]. Assessment Active Problems ICD-10 Type 2 diabetes mellitus with foot ulcer Non-pressure chronic ulcer of other part of left foot with fat layer exposed Cellulitis of left lower limb Plan Wound Cleansing: Wound #1 Left,Lateral Foot: Clean wound with Normal  Saline. Primary Wound Dressing: Wound #1 Left,Lateral Foot: Iodoflex Secondary Dressing: ABD and Kerlix/Conform Dressing Change Frequency: Wound #1 Left,Lateral Foot: Change dressing every other day. Follow-up Appointments: Wound #1 Left,Lateral Foot: Return Appointment in 1 week. Off-Loading: Open toe surgical shoe with peg assist. Laboratory ordered were: Wound culture routine - left foot Radiology ordered were: X-ray, foot - left foot The following medication(s) was prescribed: Bactrim DS oral 800 mg-160 mg tablet 1 1 tablet oral taken 2 times per day for 14 days starting 04/07/2020 1. I would recommend currently that we go ahead and initiate treatment with Iodoflex. I think this is probably can be the best option for the patient as she seems to be very macerated with the Santyl she has a lot of drainage. 2. I am also can recommend that we go ahead and start her off on a repeat course of the Bactrim which she did well with previously though the culture did not indicate specifically that there was a pathologic organism there is obviously something going on she is definitely infected today and I think this is as good choices any since she tolerated it well and it also did well for her. 3. I am also can recommend that we go ahead and send her for an x-ray of the foot to evaluate for any deeper infection as I am somewhat concerned in that regard as well. 4. I would also suggest that instead of a Telfa island dressing or bordered foam that we actually utilize an ABD pad and gauze to secure in place so that this has more room in which to drain so that it will not macerated around the foot hopefully. We will see patient back for reevaluation in 1 week here in the clinic. If anything  worsens or changes patient will contact our office for additional recommendations. Electronic Signature(s) Signed: 04/07/2020 2:48:20 PM By: Worthy Keeler PA-C Entered By: Worthy Keeler on 04/07/2020  14:48:20 [name] ([id number]) -------------------------------------------------------------------------------- SuperBill Details Patient Name: [name] Date of Service: 04/07/2020 Medical Record Number: [id number] Patient Account Number: [id number] Date of Birth/Sex: 17-Mar-1971 (49 y.o. 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 L03.116 Cellulitis of left lower limb Facility Procedures CPT4 Code: [id number] Description: 98264 - WOUND CARE VISIT-LEV 3 EST PT Modifier: Quantity: 1 Physician Procedures CPT4 Code: [id number] Description: 07680 - WC PHYS LEVEL 4 - EST PT Modifier: Quantity: 1 CPT4 Code: Description: ICD-10 Diagnosis Description E11.621 Type 2 diabetes mellitus with foot ulcer L97.522 Non-pressure chronic ulcer of other part of left foot with fat layer ex L03.116 Cellulitis of left lower limb Modifier: posed Quantity: Electronic Signature(s) Signed: 04/07/2020 2:48:36 PM By: Worthy Keeler PA-C Entered By: Worthy Keeler on 04/07/2020 14:48:36

## 2020-04-07 NOTE — Progress Notes (Addendum)
AALA, RANSOM ([id number]) Visit Report for 04/07/2020 Arrival Information Details Patient Name: [name], [name]. Date of Service: 04/07/2020 2:00 PM Medical Record Number: [id number] Patient Account Number: [id number] Date of Birth/Sex: Aug 01, 1971 (49 y.o. F) Treating RN: Grover Canavan Primary Care Nasario Czerniak: Tomasa Hose Other Clinician: Referring Jessye Imhoff: Tomasa Hose Treating Ashten Sarnowski/Extender: Melburn Hake, HOYT Weeks in Treatment: 2 Visit Information History Since Last Visit Added or deleted any medications: No Patient Arrived: Ambulatory Had a fall or experienced change in No Arrival Time: 14:10 activities of daily living that may affect Accompanied By: self risk of falls: Transfer Assistance: None Hospitalized since last visit: No Patient Identification Verified: Yes Pain Present Now: Yes Secondary Verification Process Completed: Yes Patient Has Alerts: Yes Patient Alerts: ABI left 0.87 right 1.19 TBI left 0.65 right 1.08 Electronic Signature(s) Signed: 04/07/2020 4:00:14 PM By: Army Melia Entered By: Army Melia on 04/07/2020 14:33:58 [name] ([id number]) -------------------------------------------------------------------------------- Clinic Level of Care Assessment Details Patient Name: [name] Date of Service: 04/07/2020 2:00 PM Medical Record Number: [id number] Patient Account Number: [id number] Date of Birth/Sex: 29-Dec-1970 (49 y.o. F) Treating RN: Army Melia Primary Care Concha Sudol: Tomasa Hose Other Clinician: Referring Rayan Ines: Tomasa Hose Treating Kaio Kuhlman/Extender: Melburn Hake, HOYT Weeks in Treatment: 2 Clinic Level of Care Assessment Items TOOL 4 Quantity Score []  - Use when only an EandM is performed on FOLLOW-UP visit 0 ASSESSMENTS - Nursing Assessment / Reassessment X - Reassessment of Co-morbidities (includes updates in patient status) 1 10 X- 1 5 Reassessment of Adherence to Treatment Plan ASSESSMENTS - Wound and Skin Assessment /  Reassessment []  - Simple Wound Assessment / Reassessment - one wound 0 []  - 0 Complex Wound Assessment / Reassessment - multiple wounds []  - 0 Dermatologic / Skin Assessment (not related to wound area) ASSESSMENTS - Focused Assessment []  - Circumferential Edema Measurements - multi extremities 0 []  - 0 Nutritional Assessment / Counseling / Intervention X- 1 5 Lower Extremity Assessment (monofilament, tuning fork, pulses) []  - 0 Peripheral Arterial Disease Assessment (using hand held doppler) ASSESSMENTS - Ostomy and/or Continence Assessment and Care []  - Incontinence Assessment and Management 0 []  - 0 Ostomy Care Assessment and Management (repouching, etc.) PROCESS - Coordination of Care X - Simple Patient / Family Education for ongoing care 1 15 []  - 0 Complex (extensive) Patient / Family Education for ongoing care X- 1 10 Staff obtains Programmer, systems, Records, Test Results / Process Orders []  - 0 Staff telephones HHA, Nursing Homes / Clarify orders / etc []  - 0 Routine Transfer to another Facility (non-emergent condition) []  - 0 Routine Hospital Admission (non-emergent condition) []  - 0 New Admissions / Biomedical engineer / Ordering NPWT, Apligraf, etc. []  - 0 Emergency Hospital Admission (emergent condition) X- 1 10 Simple Discharge Coordination []  - 0 Complex (extensive) Discharge Coordination PROCESS - Special Needs []  - Pediatric / Minor Patient Management 0 []  - 0 Isolation Patient Management []  - 0 Hearing / Language / Visual special needs []  - 0 Assessment of Community assistance (transportation, D/C planning, etc.) []  - 0 Additional assistance / Altered mentation []  - 0 Support Surface(s) Assessment (bed, cushion, seat, etc.) INTERVENTIONS - Wound Cleansing / Measurement FARIA, CASELLA. ([id number]) X- 1 5 Simple Wound Cleansing - one wound []  - 0 Complex Wound Cleansing - multiple wounds X- 1 5 Wound Imaging (photographs - any number of wounds) []  -  0 Wound Tracing (instead of photographs) X- 1 5 Simple Wound Measurement - one wound []  - 0 Complex Wound  Measurement - multiple wounds INTERVENTIONS - Wound Dressings []  - Small Wound Dressing one or multiple wounds 0 X- 1 15 Medium Wound Dressing one or multiple wounds []  - 0 Large Wound Dressing one or multiple wounds []  - 0 Application of Medications - topical []  - 0 Application of Medications - injection INTERVENTIONS - Miscellaneous []  - External ear exam 0 []  - 0 Specimen Collection (cultures, biopsies, blood, body fluids, etc.) []  - 0 Specimen(s) / Culture(s) sent or taken to Lab for analysis []  - 0 Patient Transfer (multiple staff / Civil Service fast streamer / Similar devices) []  - 0 Simple Staple / Suture removal (25 or less) []  - 0 Complex Staple / Suture removal (26 or more) []  - 0 Hypo / Hyperglycemic Management (close monitor of Blood Glucose) []  - 0 Ankle / Brachial Index (ABI) - do not check if billed separately X- 1 5 Vital Signs Has the patient been seen at the hospital within the last three years: Yes Total Score: 90 Level Of Care: New/Established - Level 3 Electronic Signature(s) Signed: 04/07/2020 4:00:14 PM By: Army Melia Entered By: Army Melia on 04/07/2020 14:40:14 [name] ([id number]) -------------------------------------------------------------------------------- Encounter Discharge Information Details Patient Name: [name] Date of Service: 04/07/2020 2:00 PM Medical Record Number: [id number] Patient Account Number: [id number] Date of Birth/Sex: 09/20/1970 (49 y.o. F) Treating RN: Army Melia Primary Care Jahmiya Guidotti: Tomasa Hose Other Clinician: Referring Akeelah Seppala: Tomasa Hose Treating Shwanda Soltis/Extender: Melburn Hake, HOYT Weeks in Treatment: 2 Encounter Discharge Information Items Discharge Condition: Stable Ambulatory Status: Ambulatory Discharge Destination: Home Transportation: Other Accompanied By: sister Schedule Follow-up  Appointment: Yes Clinical Summary of Care: Electronic Signature(s) Signed: 04/07/2020 4:00:14 PM By: Army Melia Entered By: Army Melia on 04/07/2020 14:45:13 [name] ([id number]) -------------------------------------------------------------------------------- Lower Extremity Assessment Details Patient Name: [name] Date of Service: 04/07/2020 2:00 PM Medical Record Number: [id number] Patient Account Number: [id number] Date of Birth/Sex: 08-06-1971 (49 y.o. F) Treating RN: Grover Canavan Primary Care Gwendalynn Eckstrom: Tomasa Hose Other Clinician: Referring Saron Vanorman: Tomasa Hose Treating Shawntel Farnworth/Extender: STONE III, HOYT Weeks in Treatment: 2 Edema Assessment Assessed: [Left: Yes] [Right: No] Edema: [Left: Ye] [Right: s] Calf Left: Right: Point of Measurement: 31 cm From Medial Instep 36 cm cm Ankle Left: Right: Point of Measurement: 9 cm From Medial Instep 22 cm cm Vascular Assessment Pulses: Dorsalis Pedis Palpable: [Left:Yes] Posterior Tibial Palpable: [Left:Yes] Electronic Signature(s) Signed: 04/12/2020 9:53:36 AM By: Grover Canavan Entered By: Grover Canavan on 04/07/2020 14:24:46 [name] ([id number]) -------------------------------------------------------------------------------- Multi Wound Chart Details Patient Name: [name] Date of Service: 04/07/2020 2:00 PM Medical Record Number: [id number] Patient Account Number: [id number] Date of Birth/Sex: [birth year] (49 y.o. F) Treating RN: Army Melia Primary Care Eural Holzschuh: Tomasa Hose Other Clinician: Referring Katessa Attridge: Tomasa Hose Treating Navi Erber/Extender: STONE III, HOYT Weeks in Treatment: 2 Vital Signs Height(in): 64 Pulse(bpm): 98 Weight(lbs): 142 Blood Pressure(mmHg): 141/78 Body Mass Index(BMI): 24 Temperature(F): 98 Respiratory Rate(breaths/min): 16 Photos: [N/A:N/A] Wound Location: Left, Lateral Foot N/A N/A Wounding Event: Gradually Appeared N/A N/A Primary Etiology:  Diabetic Wound/Ulcer of the Lower N/A N/A Extremity Comorbid History: Cataracts, Type II Diabetes, N/A N/A Neuropathy Date Acquired: 03/13/2020 N/A N/A Weeks of Treatment: 2 N/A N/A Wound Status: Open N/A N/A Measurements L x W x D (cm) 1x1.2x0.1 N/A N/A Area (cm) : 0.942 N/A N/A Volume (cm) : 0.094 N/A N/A % Reduction in Area: 50.00% N/A N/A % Reduction in Volume: 50.00% N/A N/A Classification: Grade 1 N/A N/A Exudate Amount: Medium N/A N/A Exudate Type:  Serous N/A N/A Exudate Color: amber N/A N/A Wound Margin: Flat and Intact N/A N/A Granulation Amount: Small (1-33%) N/A N/A Necrotic Amount: Large (67-100%) N/A N/A Necrotic Tissue: Eschar, Adherent Slough N/A N/A Exposed Structures: Fat Layer (Subcutaneous Tissue) N/A N/A Exposed: Yes Fascia: No Tendon: No Muscle: No Joint: No Bone: No Epithelialization: None N/A N/A Treatment Notes Electronic Signature(s) Signed: 04/07/2020 4:00:14 PM By: Army Melia Entered By: Army Melia on 04/07/2020 14:34:18 [name] ([id number]) -------------------------------------------------------------------------------- [place] Details Patient Name: [name] Date of Service: 04/07/2020 2:00 PM Medical Record Number: [id number] Patient Account Number: [id number] Date of Birth/Sex: 1970/10/13 (49 y.o. F) Treating RN: Army Melia Primary Care Unique Sillas: Tomasa Hose Other Clinician: Referring Margart Zemanek: Tomasa Hose Treating Davonne Jarnigan/Extender: Melburn Hake, HOYT Weeks in Treatment: 2 Active Inactive Orientation to the Wound Care Program Nursing Diagnoses: Knowledge deficit related to the wound healing center program Goals: Patient/caregiver will verbalize understanding of the [facility] Program Date Initiated: 03/24/2020 Target Resolution Date: 04/22/2020 Goal Status: Active Interventions: Provide education on orientation to the wound center Notes: Wound/Skin Impairment Nursing Diagnoses: Impaired  tissue integrity Goals: Ulcer/skin breakdown will have a volume reduction of 30% by week 4 Date Initiated: 03/24/2020 Target Resolution Date: 04/22/2020 Goal Status: Active Interventions: Assess ulceration(s) every visit Notes: Electronic Signature(s) Signed: 04/07/2020 4:00:14 PM By: Army Melia Entered By: Army Melia on 04/07/2020 14:34:10 [name] ([id number]) -------------------------------------------------------------------------------- Pain Assessment Details Patient Name: [name] Date of Service: 04/07/2020 2:00 PM Medical Record Number: [id number] Patient Account Number: [id number] Date of Birth/Sex: 12-Aug-1971 (49 y.o. F) Treating RN: Grover Canavan Primary Care Livvy Spilman: Tomasa Hose Other Clinician: Referring Zavier Canela: Tomasa Hose Treating Zacchaeus Halm/Extender: Melburn Hake, HOYT Weeks in Treatment: 2 Active Problems Location of Pain Severity and Description of Pain Patient Has Paino Yes Site Locations Pain Location: Pain in Ulcers With Dressing Change: Yes Duration of the Pain. Constant / Intermittento Constant Rate the pain. Current Pain Level: 10 Character of Pain Describe the Pain: Aching, Throbbing Pain Management and Medication Current Pain Management: Notes report nothing helps pain Electronic Signature(s) Signed: 04/12/2020 9:53:36 AM By: Grover Canavan Entered By: Grover Canavan on 04/07/2020 14:15:29 [name] ([id number]) -------------------------------------------------------------------------------- Patient/Caregiver Education Details Patient Name: [name] Date of Service: 04/07/2020 2:00 PM Medical Record Number: [id number] Patient Account Number: [id number] Date of Birth/Gender: [birth year] (49 y.o. F) Treating RN: Army Melia Primary Care Physician: Tomasa Hose Other Clinician: Referring Physician: Tomasa Hose Treating Physician/Extender: Sharalyn Ink in Treatment: 2 Education Assessment Education  Provided To: Patient Education Topics Provided Wound/Skin Impairment: Handouts: Caring for Your Ulcer Methods: Demonstration, Explain/Verbal Responses: State content correctly Electronic Signature(s) Signed: 04/07/2020 4:00:14 PM By: Army Melia Entered By: Army Melia on 04/07/2020 14:40:27 [name] ([id number]) -------------------------------------------------------------------------------- Wound Assessment Details Patient Name: [name] Date of Service: 04/07/2020 2:00 PM Medical Record Number: [id number] Patient Account Number: [id number] Date of Birth/Sex: [birth year] (49 y.o. F) Treating RN: Grover Canavan Primary Care Adriann Thau: Tomasa Hose Other Clinician: Referring Shaquoia Miers: Tomasa Hose Treating Ankit Degregorio/Extender: STONE III, HOYT Weeks in Treatment: 2 Wound Status Wound Number: 1 Primary Etiology: Diabetic Wound/Ulcer of the Lower Extremity Wound Location: Left, Lateral Foot Wound Status: Open Wounding Event: Gradually Appeared Comorbid History: Cataracts, Type II Diabetes, Neuropathy Date Acquired: 03/13/2020 Weeks Of Treatment: 2 Clustered Wound: No Photos Wound Measurements Length: (cm) 1 % R Width: (cm) 1.2 % R Depth: (cm) 0.1 Epi Area: (cm) 0.942 Tu Volume: (cm) 0.094 Un eduction in Area: 50% eduction  in Volume: 50% thelialization: None nneling: No dermining: No Wound Description Classification: Grade 1 Fou Wound Margin: Flat and Intact Slo Exudate Amount: Medium Exudate Type: Serous Exudate Color: amber l Odor After Cleansing: No ugh/Fibrino Yes Wound Bed Granulation Amount: Small (1-33%) Exposed Structure Necrotic Amount: Large (67-100%) Fascia Exposed: No Necrotic Quality: Eschar, Adherent Slough Fat Layer (Subcutaneous Tissue) Exposed: Yes Tendon Exposed: No Muscle Exposed: No Joint Exposed: No Bone Exposed: No Treatment Notes Wound #1 (Left, Lateral Foot) Notes iodoflex, ABD, conform Electronic Signature(s) Signed:  04/12/2020 9:53:36 AM By: Edwyna Shell, Nance Pew ([id number]) Entered By: Grover Canavan on 04/07/2020 14:21:32 [name] ([id number]) -------------------------------------------------------------------------------- Vitals Details Patient Name: [name] Date of Service: 04/07/2020 2:00 PM Medical Record Number: [id number] Patient Account Number: [id number] Date of Birth/Sex: Feb 28, 1971 (49 y.o. F) Treating RN: Grover Canavan Primary Care Ehsan Corvin: Tomasa Hose Other Clinician: Referring Lillee Mooneyhan: Tomasa Hose Treating Shivani Barrantes/Extender: Melburn Hake, HOYT Weeks in Treatment: 2 Vital Signs Time Taken: 14:13 Temperature (F): 98 Height (in): 64 Pulse (bpm): 98 Weight (lbs): 142 Respiratory Rate (breaths/min): 16 Body Mass Index (BMI): 24.4 Blood Pressure (mmHg): 141/78 Reference Range: 80 - 120 mg / dl Electronic Signature(s) Signed: 04/12/2020 9:53:36 AM By: Grover Canavan Entered By: Grover Canavan on 04/07/2020 14:14:06

## 2020-04-09 HISTORY — PX: AMPUTATION TOE: SHX6595

## 2020-04-11 ENCOUNTER — Other Ambulatory Visit
Admission: RE | Admit: 2020-04-11 | Discharge: 2020-04-11 | Disposition: A | Discharge status: Home / Self Care | Payer: Medicaid Other | Source: Ambulatory Visit | Attending: *Deleted | Admitting: *Deleted

## 2020-04-11 DIAGNOSIS — E11621 Type 2 diabetes mellitus with foot ulcer: Secondary | ICD-10-CM | POA: Diagnosis not present

## 2020-04-14 ENCOUNTER — Ambulatory Visit: Payer: Medicaid Other | Admitting: Physician Assistant

## 2020-04-14 LAB — AEROBIC CULTURE W GRAM STAIN (SUPERFICIAL SPECIMEN)

## 2020-04-15 ENCOUNTER — Other Ambulatory Visit: Admission: RE | Admit: 2020-04-15 | Payer: Medicaid Other | Source: Ambulatory Visit

## 2020-04-19 ENCOUNTER — Ambulatory Visit: Admission: RE | Admit: 2020-04-19 | Payer: Medicaid Other | Source: Home / Self Care | Admitting: Ophthalmology

## 2020-04-19 ENCOUNTER — Encounter: Admission: RE | Payer: Self-pay | Source: Home / Self Care

## 2020-04-19 SURGERY — PHACOEMULSIFICATION, CATARACT, WITH IOL INSERTION
Anesthesia: Topical | Laterality: Right

## 2020-05-11 ENCOUNTER — Encounter: Payer: Medicaid Other | Attending: Internal Medicine | Admitting: Internal Medicine

## 2020-05-11 ENCOUNTER — Other Ambulatory Visit: Payer: Self-pay

## 2020-05-11 DIAGNOSIS — T8131XA Disruption of external operation (surgical) wound, not elsewhere classified, initial encounter: Secondary | ICD-10-CM | POA: Insufficient documentation

## 2020-05-11 DIAGNOSIS — Y838 Other surgical procedures as the cause of abnormal reaction of the patient, or of later complication, without mention of misadventure at the time of the procedure: Secondary | ICD-10-CM | POA: Diagnosis not present

## 2020-05-11 DIAGNOSIS — L97524 Non-pressure chronic ulcer of other part of left foot with necrosis of bone: Secondary | ICD-10-CM | POA: Diagnosis not present

## 2020-05-11 DIAGNOSIS — Z89432 Acquired absence of left foot: Secondary | ICD-10-CM | POA: Insufficient documentation

## 2020-05-11 DIAGNOSIS — E11621 Type 2 diabetes mellitus with foot ulcer: Secondary | ICD-10-CM | POA: Diagnosis not present

## 2020-05-12 NOTE — Progress Notes (Signed)
Crystal, Hayes ([id number]) Visit Report for 05/11/2020 Arrival Information Details Patient Name: [name], [name] Date of Service: 05/11/2020 9:00 AM Medical Record Number: [id number] Patient Account Number: [id number] Date of Birth/Sex: Jun 10, 1971 (49 y.o. F) Treating RN: Cornell Barman Primary Care Wilbert Schouten: Tomasa Hose Other Clinician: Referring Lyonel Morejon: Tomasa Hose Treating Valmore Arabie/Extender: Tito Dine in Treatment: 6 Visit Information History Since Last Visit Added or deleted any medications: No Patient Arrived: Wheel Chair Had a fall or experienced change in No Arrival Time: 09:11 activities of daily living that may affect Accompanied By: SISTER risk of falls: Transfer Assistance: None Hospitalized since last visit: No Patient Has Alerts: Yes Pain Present Now: No Patient Alerts: ABI left 0.87 right 1.19 TBI left 0.65 right 1.08 Electronic Signature(s) Signed: 05/11/2020 5:45:00 PM By: Gretta Cool, BSN, RN, CWS, Kim RN, BSN Entered By: Gretta Cool, BSN, RN, CWS, Kim on 05/11/2020 09:13:15 [name] ([id number]) -------------------------------------------------------------------------------- Clinic Level of Care Assessment Details Patient Name: [name] Date of Service: 05/11/2020 9:00 AM Medical Record Number: [id number] Patient Account Number: [id number] Date of Birth/Sex: 20-Aug-1971 (49 y.o. F) Treating RN: Cornell Barman Primary Care Bryttney Netzer: Tomasa Hose Other Clinician: Referring Velton Roselle: Tomasa Hose Treating Dave Mergen/Extender: Tito Dine in Treatment: 6 Clinic Level of Care Assessment Items TOOL 4 Quantity Score []  - Use when only an EandM is performed on FOLLOW-UP visit 0 ASSESSMENTS - Nursing Assessment / Reassessment X - Reassessment of Co-morbidities (includes updates in patient status) 1 10 X- 1 5 Reassessment of Adherence to Treatment Plan ASSESSMENTS - Wound and Skin Assessment / Reassessment X - Simple Wound Assessment / Reassessment  - one wound 1 5 []  - 0 Complex Wound Assessment / Reassessment - multiple wounds []  - 0 Dermatologic / Skin Assessment (not related to wound area) ASSESSMENTS - Focused Assessment []  - Circumferential Edema Measurements - multi extremities 0 []  - 0 Nutritional Assessment / Counseling / Intervention []  - 0 Lower Extremity Assessment (monofilament, tuning fork, pulses) []  - 0 Peripheral Arterial Disease Assessment (using hand held doppler) ASSESSMENTS - Ostomy and/or Continence Assessment and Care []  - Incontinence Assessment and Management 0 []  - 0 Ostomy Care Assessment and Management (repouching, etc.) PROCESS - Coordination of Care X - Simple Patient / Family Education for ongoing care 1 15 []  - 0 Complex (extensive) Patient / Family Education for ongoing care X- 1 10 Staff obtains Programmer, systems, Records, Test Results / Process Orders []  - 0 Staff telephones HHA, Nursing Homes / Clarify orders / etc []  - 0 Routine Transfer to another Facility (non-emergent condition) []  - 0 Routine Hospital Admission (non-emergent condition) []  - 0 New Admissions / Biomedical engineer / Ordering NPWT, Apligraf, etc. []  - 0 Emergency Hospital Admission (emergent condition) X- 1 10 Simple Discharge Coordination []  - 0 Complex (extensive) Discharge Coordination PROCESS - Special Needs []  - Pediatric / Minor Patient Management 0 []  - 0 Isolation Patient Management []  - 0 Hearing / Language / Visual special needs []  - 0 Assessment of Community assistance (transportation, D/C planning, etc.) []  - 0 Additional assistance / Altered mentation []  - 0 Support Surface(s) Assessment (bed, cushion, seat, etc.) INTERVENTIONS - Wound Cleansing / Measurement [name], SOMERO. ([id number]) X- 1 5 Simple Wound Cleansing - one wound []  - 0 Complex Wound Cleansing - multiple wounds X- 1 5 Wound Imaging (photographs - any number of wounds) []  - 0 Wound Tracing (instead of photographs) X- 1  5 Simple Wound Measurement - one wound []  - 0 Complex  Wound Measurement - multiple wounds INTERVENTIONS - Wound Dressings []  - Small Wound Dressing one or multiple wounds 0 X- 1 15 Medium Wound Dressing one or multiple wounds []  - 0 Large Wound Dressing one or multiple wounds []  - 0 Application of Medications - topical []  - 0 Application of Medications - injection INTERVENTIONS - Miscellaneous []  - External ear exam 0 []  - 0 Specimen Collection (cultures, biopsies, blood, body fluids, etc.) []  - 0 Specimen(s) / Culture(s) sent or taken to Lab for analysis []  - 0 Patient Transfer (multiple staff / Civil Service fast streamer / Similar devices) []  - 0 Simple Staple / Suture removal (25 or less) []  - 0 Complex Staple / Suture removal (26 or more) []  - 0 Hypo / Hyperglycemic Management (close monitor of Blood Glucose) []  - 0 Ankle / Brachial Index (ABI) - do not check if billed separately []  - 0 Vital Signs Has the patient been seen at the hospital within the last three years: Yes Total Score: 85 Level Of Care: New/Established - Level 3 Electronic Signature(s) Signed: 05/11/2020 5:45:00 PM By: Gretta Cool, BSN, RN, CWS, Kim RN, BSN Entered By: Gretta Cool, BSN, RN, CWS, Kim on 05/11/2020 09:51:24 [name] ([id number]) -------------------------------------------------------------------------------- Encounter Discharge Information Details Patient Name: [name] Date of Service: 05/11/2020 9:00 AM Medical Record Number: [id number] Patient Account Number: [id number] Date of Birth/Sex: 05-28-1971 (49 y.o. F) Treating RN: Cornell Barman Primary Care Bosco Paparella: Tomasa Hose Other Clinician: Referring Makaylia Hewett: Tomasa Hose Treating Claudine Stallings/Extender: Tito Dine in Treatment: 6 Encounter Discharge Information Items Discharge Condition: Stable Ambulatory Status: Wheelchair Discharge Destination: Home Transportation: Private Auto Accompanied By: sister Schedule Follow-up Appointment:  Yes Clinical Summary of Care: Electronic Signature(s) Signed: 05/11/2020 5:45:00 PM By: Gretta Cool, BSN, RN, CWS, Kim RN, BSN Entered By: Gretta Cool, BSN, RN, CWS, Kim on 05/11/2020 09:53:10 [name] ([id number]) -------------------------------------------------------------------------------- Lower Extremity Assessment Details Patient Name: [name] Date of Service: 05/11/2020 9:00 AM Medical Record Number: [id number] Patient Account Number: [id number] Date of Birth/Sex: 10-Apr-1971 (49 y.o. F) Treating RN: Cornell Barman Primary Care Sabino Denning: Tomasa Hose Other Clinician: Referring Reva Pinkley: Tomasa Hose Treating Carlyon Nolasco/Extender: Tito Dine in Treatment: 6 Edema Assessment Assessed: [Left: No] [Right: No] [Left: Edema] [Right: :] Calf Left: Right: Point of Measurement: cm From Medial Instep 34 cm cm Ankle Left: Right: Point of Measurement: cm From Medial Instep 23 cm cm Vascular Assessment Pulses: Dorsalis Pedis Palpable: [Left:Yes] Posterior Tibial Palpable: [Left:Yes] Electronic Signature(s) Signed: 05/11/2020 5:45:00 PM By: Gretta Cool, BSN, RN, CWS, Kim RN, BSN Entered By: Gretta Cool, BSN, RN, CWS, Kim on 05/11/2020 09:34:00 [name] ([id number]) -------------------------------------------------------------------------------- Multi Wound Chart Details Patient Name: [name] Date of Service: 05/11/2020 9:00 AM Medical Record Number: [id number] Patient Account Number: [id number] Date of Birth/Sex: May 09, 1971 (49 y.o. 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 Surgical Wound N/A Extremity Comorbid History: N/A Cataracts, Type II Diabetes, N/A Neuropathy Date Acquired: 03/13/2020 04/09/2020 N/A Weeks of Treatment: 6 0 N/A Wound Status: Healed - Epithelialized Open N/A Measurements L x W x D (cm) 0x0x0 3.5x10x0.8 N/A Area (cm) : 0 27.489 N/A  Volume (cm) : 0 21.991 N/A Classification: Grade 1 Full Thickness With Exposed N/A Support Structures Exudate Amount: N/A Medium N/A Exudate Type: N/A Serosanguineous N/A Exudate Color: N/A red, brown N/A Wound Margin: N/A Epibole N/A Granulation Amount: N/A Large (67-100%) N/A Granulation Quality: N/A Red N/A Necrotic Amount: N/A Small (1-33%) N/A Epithelialization: N/A None N/A Treatment Notes Electronic Signature(s) Signed: 05/12/2020 4:51:35 PM By: Linton Ham MD Entered By: Linton Ham on 05/11/2020 09:51:40 [name] ([id number]) -------------------------------------------------------------------------------- [place] Details Patient Name: [name] Date of Service: 05/11/2020 9:00 AM Medical Record Number: [id number] Patient Account Number: [id number] Date of Birth/Sex: Nov 07, 1970 (49 y.o. F) Treating RN: Cornell Barman Primary Care Leia Coletti: Tomasa Hose Other Clinician: Referring Sherlynn Tourville: Tomasa Hose Treating Francetta Ilg/Extender: Tito Dine in Treatment: 6 Active Inactive Orientation to the Wound Care Program Nursing Diagnoses: Knowledge deficit related to the wound healing center program Goals: Patient/caregiver will verbalize understanding of the [facility] Program Date Initiated: 03/24/2020 Target Resolution Date: 04/22/2020 Goal Status: Active Interventions: Provide education on orientation to the wound center Notes: Wound/Skin Impairment Nursing Diagnoses: Impaired tissue integrity Goals: Ulcer/skin  breakdown will have a volume reduction of 30% by week 4 Date Initiated: 03/24/2020 Target Resolution Date: 04/22/2020 Goal Status: Active Interventions: Assess ulceration(s) every visit Notes: Electronic Signature(s) Signed: 05/11/2020 5:45:00 PM By: Gretta Cool, BSN, RN, CWS, Kim RN, BSN Entered By: Gretta Cool, BSN, RN, CWS, Kim on 05/11/2020 09:41:48 [name] ([id number]) -------------------------------------------------------------------------------- Pain Assessment Details Patient Name: [name] Date of Service: 05/11/2020 9:00 AM Medical Record Number: [id number] Patient Account Number: [id number] Date of Birth/Sex: [birth year] (49 y.o. F) Treating RN: Cornell Barman Primary Care Aidyn Sportsman: Tomasa Hose Other Clinician: Referring Hattie Pine: Tomasa Hose Treating Ilyana Manuele/Extender: Tito Dine in Treatment: 6 Active Problems Location of Pain Severity and Description of Pain Patient Has Paino No Site Locations Pain Management and Medication Current Pain Management: Electronic Signature(s) Signed: 05/11/2020 5:45:00 PM By: Gretta Cool, BSN, RN, CWS, Kim RN, BSN Entered By: Gretta Cool, BSN, RN, CWS, Kim on 05/11/2020 09:17:39 [name] ([id number]) -------------------------------------------------------------------------------- Patient/Caregiver Education Details Patient Name: [name] Date of Service: 05/11/2020 9:00 AM Medical Record Number: [id number] Patient Account Number: [id number] Date of Birth/Gender: 01-May-1971 (49 y.o. F) Treating RN: Cornell Barman Primary Care Physician: Tomasa Hose Other Clinician: Referring Physician: Tomasa Hose Treating Physician/Extender: Tito Dine in Treatment: 6 Education Assessment Education Provided To: Patient Education Topics Provided Wound/Skin Impairment: Handouts: Caring for Your Ulcer Methods: Demonstration, Explain/Verbal Responses: State content correctly Electronic Signature(s) Signed: 05/11/2020 5:45:00 PM By:  Gretta Cool, BSN, RN, CWS, Kim RN, BSN Entered By: Gretta Cool, BSN, RN, CWS, Kim on 05/11/2020 09:51:59 [name] ([id number]) -------------------------------------------------------------------------------- Wound Assessment Details Patient Name: [name] Date of Service: 05/11/2020 9:00 AM Medical Record Number: [id number] Patient Account Number: [id number] Date of Birth/Sex: Feb 09, 1971 (49 y.o. F) Treating RN: Cornell Barman Primary Care Tylyn Stankovich: Tomasa Hose Other Clinician: Referring Yen Wandell: Tomasa Hose Treating Suesan Mohrmann/Extender: Tito Dine in Treatment: 6 Wound Status Wound Number: 1 Primary Etiology: Diabetic Wound/Ulcer of the Lower Extremity Wound Location: Left, Lateral Foot Wound Status: Healed - Epithelialized Wounding Event: Gradually Appeared Date Acquired: 03/13/2020 Weeks Of Treatment: 6 Clustered Wound: No Wound Measurements Length: (cm) Width: (cm) Depth: (cm) Area: (cm) Volume: (cm) 0 % Reduction in Area: 0 % Reduction in Volume: 0 0 0 Wound Description Classification: Grade 1 Electronic Signature(s) Signed: 05/11/2020 5:45:00 PM By: Gretta Cool, BSN, RN, CWS, Kim RN, BSN Entered By: Gretta Cool,  BSN, RN, CWS, Kim on 05/11/2020 09:46:31 [name], [name] ([id number]) -------------------------------------------------------------------------------- Wound Assessment Details Patient Name: Crystal, Hayes Date of Service: 05/11/2020 9:00 AM Medical Record Number: [id number] Patient Account Number: [id number] Date of Birth/Sex: 29-Mar-1971 (49 y.o. F) Treating RN: Cornell Barman Primary Care Michaelia Beilfuss: Tomasa Hose Other Clinician: Referring Mckenlee Mangham: Tomasa Hose Treating Willman Cuny/Extender: Tito Dine in Treatment: 6 Wound Status Wound Number: 2 Primary Etiology: Open Surgical Wound Wound Location: Left, Lateral Foot Wound Status: Open Wounding Event: Surgical Injury Comorbid History: Cataracts, Type II Diabetes, Neuropathy Date Acquired:  04/09/2020 Weeks Of Treatment: 0 Clustered Wound: No Photos Wound Measurements Length: (cm) 3.5 Width: (cm) 10 Depth: (cm) 0.8 Area: (cm) 27.489 Volume: (cm) 21.991 % Reduction in Area: % Reduction in Volume: Epithelialization: None Tunneling: No Undermining: No Wound Description Classification: Full Thickness With Exposed Support Structures Wound Margin: Epibole Exudate Amount: Medium Exudate Type: Serosanguineous Exudate Color: red, brown Foul Odor After Cleansing: No Slough/Fibrino Yes Wound Bed Granulation Amount: Large (67-100%) Exposed Structure Granulation Quality: Red Fascia Exposed: No Necrotic Amount: Small (1-33%) Fat Layer (Subcutaneous Tissue) Exposed: Yes Necrotic Quality: Adherent Slough Tendon Exposed: No Muscle Exposed: No Joint Exposed: No Bone Exposed: No Electronic Signature(s) Signed: 05/11/2020 5:45:00 PM By: Gretta Cool, BSN, RN, CWS, Kim RN, BSN Entered By: Gretta Cool, BSN, RN, CWS, Kim on 05/11/2020 09:32:09 [name] ([id number]) -------------------------------------------------------------------------------- [place] Details Patient Name: [name] Date of Service: 05/11/2020 9:00 AM Medical Record Number: [id number] Patient Account Number: [id number] Date of Birth/Sex: 09/17/1971 (49 y.o. F) Treating RN: Cornell Barman Primary Care Nykira Reddix: Tomasa Hose Other Clinician: Referring Vinia Jemmott: Tomasa Hose Treating Sheron Robin/Extender: Tito Dine in Treatment: 6 Vital Signs Time Taken: 09:13 Temperature (F): 97.7 Height (in): 64 Pulse (bpm): 90 Weight (lbs): 142 Respiratory Rate (breaths/min): 16 Body Mass Index (BMI): 24.4 Blood Pressure (mmHg): 99/60 Reference Range: 80 - 120 mg / dl Electronic Signature(s) Signed: 05/11/2020 5:45:00 PM By: Gretta Cool, BSN, RN, CWS, Kim RN, BSN Entered By: Gretta Cool, BSN, RN, CWS, Kim on 05/11/2020 09:17:33

## 2020-05-12 NOTE — Progress Notes (Signed)
Crystal, Hayes ([id number]) Visit Report for 05/11/2020 HPI Details Patient Name: Crystal, Hayes Date of Service: 05/11/2020 9:00 AM Medical Record Number: [id number] Patient Account Number: [id number] Date of Birth/Sex: 12-05-1970 (49 y.o. F) Treating RN: [name] Primary Care Provider: Tomasa Hayes Other Clinician: Referring Provider: Tomasa Hayes Treating Provider/Extender: [name] in Treatment: 6 History of Present Illness HPI Description: 03/24/2020 upon evaluation today patient presents for initial inspection here in our clinic concerning the wound on her left lateral foot and the fifth metatarsal region which came up about 2 weeks ago. Subsequently the patient has been seen by her primary care provider who did place her on Bactrim that does seem to have been beneficial. She does have a hemoglobin of 11.1 which was measured on 03/02/2020. She was in the hospital right around that time due to diabetic ketoacidosis for 7 days. Apparently this was due to medication change. At this point she seems to be doing somewhat better which is great news. Fortunately there is no signs of active infection at this time. Patient's culture grew mixed flora that was no definitive diagnosis here as far as the bacteria involved. However the Bactrim does seem to be helping her pain is also greatly improved. Currently the been doing vinegar water soaks along with Dakin's applied to the wound afterwards this is what was used on her heel previous whenever she had an issue. Subsequently the patient has not had vascular evaluation unfortunately her ABIs were somewhat unusual we could not really get a good reading today therefore I question whether she has good arterial flow for that reason I would avoid any aggressive debridement right now until we can get her in for an arterial study with ABI and TBI. She is seen with her sister in the office today as well. 7/15; patient has her arterial studies booked  for 7/21 at [facility]. She is using Santyl to the wound. 04/07/2020 upon evaluation today patient appears to be doing poorly at this point with regard to her wound. Unfortunately this appears to be more erythematous than the last time I saw her and I do believe that she needs antibiotics at this time. There does not appear to be any signs of active infection systemically but locally I am very concerned in this regard. She has been having increased pain and tells me that earlier this week it was very significant. There does not appear to be any signs of active infection systemically at this point as mentioned above. No fevers, chills, nausea, vomiting, or diarrhea. The patient's culture from previous that I reviewed from chart review showed a mixed culture of normal flora that did not reveal any pathologic organisms. Nonetheless she was on Bactrim and it did help at that point. She has been off Bactrim for 2 weeks however. Obviously I do feel like this is an ongoing and continuing infection at this point. 05/11/2020; patient comes back to clinic today after a prolonged stay [facility] from 8/9 through 8/18; she was admitted with a severe infection of the left foot. 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 She was brought back to the OR on 7/26 for debridement and then back again on 7/29 for removal of her necrotic flap and attempted closure. Bone culture was negative at the margins. X-rays prior to surgery showed osteomyelitis in the involved area. She was discharged with a wound VAC which family is actually changing. She follows with  infectious disease Dr. Wandra Hayes. 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 She also had fungal cultures done although she is not on any antifungals that I can see. 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 She is not having any problems with systemic symptoms fever or chills. As mentioned family members changing the wound VAC at home. Mild amount of drainage. She has an appointment with her surgeon/podiatry next week Electronic Signature(s) Signed: 05/12/2020 4:51:35 PM By: Crystal Ham MD Entered By: [name] on 05/11/2020 09:55:20 [name] ([id number]) -------------------------------------------------------------------------------- Physical Exam Details Patient Name: [name] Date of Service: 05/11/2020 9:00 AM Medical Record Number: [id number] Patient Account Number: [id number] Date of Birth/Sex: [birth year] (50 y.o. F) Treating RN: [name] Primary Care Provider: Tomasa Hayes Other Clinician: Referring Provider: Tomasa Hayes Treating Provider/Extender: [name] in Treatment: 6 Constitutional Patient is hypertensive.. Pulse regular and within target range for patient.Marland Kitchen Respirations regular, non-labored and within target range.. Temperature is normal and within the target range for the patient.Marland Kitchen appears in no distress. Cardiovascular Popliteal pulses palpable. Pedal pulses on the left foot were easily palpable.. Notes Wound exam; substantial wound on the lateral part of the left foot plantar aspect extending dorsally. There is 1 area of exposed bone. Most of the granulation here looks healthy some debris on some areas. No mechanical debridement today. Electronic Signature(s) Signed: 05/12/2020 4:51:35 PM By: Crystal Ham MD Entered By: [name] on 05/11/2020 09:56:54 [name] ([id number]) -------------------------------------------------------------------------------- Physician Orders Details Patient Name: [name] Date of Service:  05/11/2020 9:00 AM Medical Record Number: [id number] Patient Account Number: [id number] Date of Birth/Sex: 1971-03-16 (49 y.o. F) Treating RN: [name] Primary Care Provider: Tomasa Hayes Other Clinician: Referring Provider: Tomasa Hayes Treating Provider/Extender: [name] in Treatment: 6 Verbal / Phone Orders: No Diagnosis Coding Wound Cleansing Wound #2 Left,Lateral Foot o Clean wound with Normal Saline. Skin Barriers/Peri-Wound Care Wound #2 Left,Lateral Foot o Skin Prep Primary Wound Dressing Wound #2 Left,Lateral Foot o Saline moistened gauze Secondary Dressing Wound #2 Left,Lateral Foot o ABD and Kerlix/Conform Dressing Change Frequency Wound #2 Left,Lateral Foot o Change Dressing Monday, Wednesday, Friday Follow-up Appointments Wound #2 Left,Lateral Foot o Return Appointment in 1 week. Negative Pressure Wound Therapy Wound #2 Left,Lateral Foot o Wound VAC settings at 125/130 mmHg continuous pressure. Use BLACK/GREEN foam to wound cavity. Use WHITE foam to fill any tunnel/s and/or undermining. Change VAC dressing 3 X WEEK. Change canister as indicated when full. Nurse may titrate settings and frequency of dressing changes as clinically indicated. - Place collagen under wound [place] Nurse may d/c VAC for s/s of increased infection, significant wound regression, or uncontrolled drainage. [place] at [phone]. o Apply contact layer over base of wound. Electronic Signature(s) Signed: 05/11/2020 5:45:00 PM By: Gretta Cool, BSN, RN, CWS, Kim RN, BSN Signed: 05/12/2020 4:51:35 PM By: Crystal Ham MD Entered By: Gretta Cool, BSN, RN, CWS, Kim on 05/11/2020 09:49:41 [name], CZAPLICKI ([id number]) -------------------------------------------------------------------------------- Problem List Details Patient Name: [name] Date of Service: 05/11/2020 9:00 AM Medical Record Number: [id number] Patient Account Number:  [id number] Date of Birth/Sex: May 19, 1971 (49 y.o. 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/11/2020 No Yes bone T81.31XD Disruption of external operation (surgical) wound, not elsewhere 05/11/2020 No Yes classified, subsequent encounter Inactive Problems ICD-10 Code Description Active Date Inactive Date L03.116 Cellulitis of left lower limb 03/24/2020 03/24/2020 L97.522 Non-pressure chronic ulcer of other part of left foot with fat layer exposed 03/24/2020 03/24/2020 Resolved Problems Electronic Signature(s) Signed: 05/12/2020 4:51:35 PM By: Crystal Ham MD Entered By: [name] on 05/11/2020 09:51:28 [name] ([id number]) -------------------------------------------------------------------------------- Progress Note Details Patient Name: [name] Date of Service: 05/11/2020 9:00 AM Medical Record Number: [id number] Patient Account Number: [id number] Date of Birth/Sex: 1970/12/31 (49 y.o. F) Treating RN: [name] Primary Care Provider: Tomasa Hayes Other Clinician: Referring Provider: Tomasa Hayes Treating Provider/Extender: [name] in Treatment: 6 Subjective History of Present Illness (HPI) 03/24/2020 upon evaluation today patient presents for initial inspection here in our clinic concerning the wound on her left lateral foot and the fifth metatarsal region which came up about 2 weeks ago. Subsequently the patient has been seen by her primary care provider who did place her on Bactrim that does seem to have been beneficial. She does have a hemoglobin of 11.1 which was measured on 03/02/2020. She was  in the hospital right around that time due to diabetic ketoacidosis for 7 days. Apparently this was due to medication change. At this point she seems to be doing somewhat better which is great news. Fortunately there is no signs of active infection at this time. Patient's culture grew mixed flora that was no definitive diagnosis here as far as the bacteria involved. However the Bactrim does seem to be helping her pain is also greatly improved. Currently the been doing vinegar water soaks along with Dakin's applied to the wound afterwards this is what was used on her heel previous whenever she had an issue. Subsequently the patient has not had vascular evaluation unfortunately her ABIs were somewhat unusual we could not really get a good reading today therefore I question whether she has good arterial flow for that reason I would avoid any aggressive debridement right now until we can get her in for an arterial study with ABI and TBI. She is seen with her sister in the office today as well. 7/15; patient has her arterial studies booked for 7/21 at [facility]. She is using Santyl to the wound. 04/07/2020 upon evaluation today patient appears to be doing poorly at this point with regard to her wound. Unfortunately this appears to be more erythematous than the last time I saw her and I do believe that she needs antibiotics at this time. There does not appear to be any signs of active infection systemically but locally I am very concerned in this regard. She has been having increased pain and tells me that earlier this week it was very significant. There does not appear to be any signs of active infection systemically at this point as mentioned above. No fevers, chills, nausea, vomiting, or diarrhea. The patient's culture from previous that I reviewed from chart review showed a mixed culture of normal flora that did not reveal any pathologic organisms. Nonetheless she was on Bactrim and it  did help  at that point. She has been off Bactrim for 2 weeks however. Obviously I do feel like this is an ongoing and continuing infection at this point. 05/11/2020; patient comes back to clinic today after a prolonged stay [facility] from 8/9 through 8/18; she was admitted with a severe infection of the left foot. 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 She was brought back to the OR on 7/26 for debridement and then back again on 7/29 for removal of her necrotic flap and attempted closure. Bone culture was negative at the margins. X-rays prior to surgery showed osteomyelitis in the involved area. She was discharged with a wound VAC which family is actually changing. She follows with infectious disease Dr. Wandra Hayes. 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 She also had fungal cultures done although she is not on any antifungals that I can see. 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 She is not having any problems with systemic symptoms fever or chills. As mentioned family members changing the wound VAC at home. Mild amount of drainage. She has an appointment with her surgeon/podiatry next week Objective Constitutional Patient is hypertensive.. Pulse regular and within target range for patient.Marland Kitchen Respirations regular, non-labored and within target range.. Temperature is normal and within the target range for the patient.Marland Kitchen appears in no distress. Vitals Time Taken: 9:13 AM, Height: 64 in, Weight: 142 lbs, BMI: 24.4, Temperature: 97.7 F, Pulse: 90 bpm, Respiratory Rate: 16 breaths/min, Blood Pressure: 99/60 mmHg. Cardiovascular Popliteal pulses palpable.  Pedal pulses on the left foot were easily palpable.. General Notes: Wound exam; substantial wound on the lateral part of the left foot plantar aspect extending dorsally. There is 1 area of exposed bone. Most of the granulation here looks healthy some debris on some areas. No mechanical debridement today. Integumentary (Hair, Skin) Wound #1 status is Healed - Epithelialized. Original cause of wound was Gradually Appeared. The wound is located on the Left,Lateral Foot. The wound measures 0cm length x 0cm width x 0cm depth; 0cm^2 area and 0cm^3 volume. Wound #2 status is Open. Original cause of wound was Surgical Injury. The wound is located on the Left,Lateral Foot. The wound measures 3.5cm [name], [name]. ([id number]) length x 10cm width x 0.8cm depth; 27.489cm^2 area and 21.991cm^3 volume. There is Fat Layer (Subcutaneous Tissue) exposed. There is no tunneling or undermining noted. There is a medium amount of serosanguineous drainage noted. The wound margin is epibole. There is large (67- 100%) red granulation within the wound bed. There is a small (1-33%) amount of necrotic tissue within the wound bed including Adherent Slough. 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 Skin Barriers/Peri-Wound Care: Wound #2 Left,Lateral Foot: Skin Prep Primary Wound Dressing: Wound #2 Left,Lateral Foot: Saline moistened gauze Secondary Dressing: Wound #2 Left,Lateral Foot: ABD and Kerlix/Conform Dressing Change Frequency: Wound #2 Left,Lateral Foot: Change Dressing Monday, Wednesday, Friday Follow-up Appointments: Wound #2 Left,Lateral Foot: Return Appointment in 1 week.  Negative Pressure Wound Therapy: Wound  #2 Left,Lateral Foot: Wound VAC settings at 125/130 mmHg continuous pressure. Use BLACK/GREEN foam to wound cavity. Use WHITE foam to fill any tunnel/s and/or undermining. Change VAC dressing 3 X WEEK. Change canister as indicated when full. Nurse may titrate settings and frequency of dressing changes as clinically indicated. - Place collagen under wound [place] Nurse may d/c VAC for s/s of increased infection, significant wound regression, or uncontrolled drainage. [place] at [phone]. Apply contact layer over base of wound. #1 continue with the wound VAC with collagen over the small area of exposed bone. 2. I see no current evidence of infection. 3. She is nonweightbearing staying with family members. I spent 35 minutes in review of this patient's past medical history, face-to-face evaluation and preparation of this record Electronic Signature(s) Signed: 05/12/2020 4:51:35 PM By: Crystal Ham MD Entered By: [name] on 05/11/2020 10:18:42 [name] ([id number]) -------------------------------------------------------------------------------- [place] Details Patient Name: [name] Date of Service: 05/11/2020 Medical Record Number: [id number] Patient Account Number: [id number] Date of Birth/Sex: 06-Jan-1971 (49 y.o. 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 T81.31XD Disruption of external operation (surgical) wound, not elsewhere classified, subsequent encounter Facility Procedures CPT4 Code: [id number] Description: 99213 - WOUND CARE VISIT-LEV 3 EST PT Modifier: Quantity: 1 Physician Procedures CPT4 Code Description:  [id number] 99214 - WC PHYS LEVEL 4 - EST PT Modifier: Quantity: 1 CPT4 Code Description: ICD-10 Diagnosis Description E11.621 Type 2 diabetes mellitus with foot ulcer L97.524 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

## 2020-05-18 ENCOUNTER — Encounter: Payer: Medicaid Other | Attending: Internal Medicine | Admitting: Internal Medicine

## 2020-05-18 ENCOUNTER — Other Ambulatory Visit: Payer: Self-pay

## 2020-05-18 DIAGNOSIS — E11621 Type 2 diabetes mellitus with foot ulcer: Secondary | ICD-10-CM | POA: Insufficient documentation

## 2020-05-18 DIAGNOSIS — E1140 Type 2 diabetes mellitus with diabetic neuropathy, unspecified: Secondary | ICD-10-CM | POA: Diagnosis not present

## 2020-05-18 DIAGNOSIS — L97524 Non-pressure chronic ulcer of other part of left foot with necrosis of bone: Secondary | ICD-10-CM | POA: Diagnosis not present

## 2020-05-18 DIAGNOSIS — T8131XD Disruption of external operation (surgical) wound, not elsewhere classified, subsequent encounter: Secondary | ICD-10-CM | POA: Insufficient documentation

## 2020-05-18 DIAGNOSIS — X58XXXA Exposure to other specified factors, initial encounter: Secondary | ICD-10-CM | POA: Insufficient documentation

## 2020-05-18 DIAGNOSIS — E1151 Type 2 diabetes mellitus with diabetic peripheral angiopathy without gangrene: Secondary | ICD-10-CM | POA: Diagnosis not present

## 2020-05-19 NOTE — Progress Notes (Signed)
[name], [name] ([id number]) Visit Report for 05/18/2020 Arrival Information Details Patient Name: LACONYA, CLERE Date of Service: 05/18/2020 8:15 AM Medical Record Number: [id number] Patient Account Number: [id number] Date of Birth/Sex: 01/30/1971 (49 y.o. F) Treating RN: Cornell Barman Primary Care Generoso Cropper: Tomasa Hose Other Clinician: Referring Jaszmine Navejas: Tomasa Hose Treating Cameron Schwinn/Extender: Tito Dine in Treatment: 7 Visit Information History Since Last Visit All ordered tests and consults were completed: No Patient Arrived: Wheel Chair Added or deleted any medications: No Arrival Time: 08:31 Any new allergies or adverse reactions: No Accompanied By: daughter Had a fall or experienced change in No Transfer Assistance: None activities of daily living that may affect Patient Identification Verified: Yes risk of falls: Secondary Verification Process Completed: Yes Signs or symptoms of abuse/neglect since last visito No Patient Requires Transmission-Based No Hospitalized since last visit: No Precautions: Implantable device outside of the clinic excluding No Patient Has Alerts: Yes cellular tissue based products placed in the center Patient Alerts: ABI left 0.87 right since last visit: 1.19 Pain Present Now: No TBI left 0.65 right 1.08 Electronic Signature(s) Signed: 05/18/2020 11:38:20 AM By: Darci Needle Entered By: Darci Needle on 05/18/2020 08:32:22 [name] ([id number]) -------------------------------------------------------------------------------- Clinic Level of Care Assessment Details Patient Name: [name] Date of Service: 05/18/2020 8:15 AM Medical Record Number: [id number] Patient Account Number: [id number] Date of Birth/Sex: [birth year] (49 y.o. F) Treating RN: Cornell Barman Primary Care Estuardo Frisbee: Tomasa Hose Other Clinician: Referring Halee Glynn: Tomasa Hose Treating Piero Mustard/Extender: Tito Dine in Treatment: 7 Clinic  Level of Care Assessment Items TOOL 4 Quantity Score []  - Use when only an EandM is performed on FOLLOW-UP visit 0 ASSESSMENTS - Nursing Assessment / Reassessment []  - Reassessment of Co-morbidities (includes updates in patient status) 0 []  - 0 Reassessment of Adherence to Treatment Plan ASSESSMENTS - Wound and Skin Assessment / Reassessment X - Simple Wound Assessment / Reassessment - one wound 1 5 []  - 0 Complex Wound Assessment / Reassessment - multiple wounds []  - 0 Dermatologic / Skin Assessment (not related to wound area) ASSESSMENTS - Focused Assessment []  - Circumferential Edema Measurements - multi extremities 0 []  - 0 Nutritional Assessment / Counseling / Intervention []  - 0 Lower Extremity Assessment (monofilament, tuning fork, pulses) []  - 0 Peripheral Arterial Disease Assessment (using hand held doppler) ASSESSMENTS - Ostomy and/or Continence Assessment and Care []  - Incontinence Assessment and Management 0 []  - 0 Ostomy Care Assessment and Management (repouching, etc.) PROCESS - Coordination of Care X - Simple Patient / Family Education for ongoing care 1 15 []  - 0 Complex (extensive) Patient / Family Education for ongoing care X- 1 10 Staff obtains Programmer, systems, Records, Test Results / Process Orders []  - 0 Staff telephones HHA, Nursing Homes / Clarify orders / etc []  - 0 Routine Transfer to another Facility (non-emergent condition) []  - 0 Routine Hospital Admission (non-emergent condition) []  - 0 New Admissions / Biomedical engineer / Ordering NPWT, Apligraf, etc. []  - 0 Emergency Hospital Admission (emergent condition) X- 1 10 Simple Discharge Coordination []  - 0 Complex (extensive) Discharge Coordination PROCESS - Special Needs []  - Pediatric / Minor Patient Management 0 []  - 0 Isolation Patient Management []  - 0 Hearing / Language / Visual special needs []  - 0 Assessment of Community assistance (transportation, D/C planning, etc.) []  -  0 Additional assistance / Altered mentation []  - 0 Support Surface(s) Assessment (bed, cushion, seat, etc.) INTERVENTIONS - Wound Cleansing / Measurement [name], [name]. ([id number]) []  -  0 Simple Wound Cleansing - one wound []  - 0 Complex Wound Cleansing - multiple wounds []  - 0 Wound Imaging (photographs - any number of wounds) []  - 0 Wound Tracing (instead of photographs) []  - 0 Simple Wound Measurement - one wound []  - 0 Complex Wound Measurement - multiple wounds INTERVENTIONS - Wound Dressings []  - Small Wound Dressing one or multiple wounds 0 X- 1 15 Medium Wound Dressing one or multiple wounds []  - 0 Large Wound Dressing one or multiple wounds []  - 0 Application of Medications - topical []  - 0 Application of Medications - injection INTERVENTIONS - Miscellaneous []  - External ear exam 0 []  - 0 Specimen Collection (cultures, biopsies, blood, body fluids, etc.) []  - 0 Specimen(s) / Culture(s) sent or taken to Lab for analysis []  - 0 Patient Transfer (multiple staff / Civil Service fast streamer / Similar devices) []  - 0 Simple Staple / Suture removal (25 or less) []  - 0 Complex Staple / Suture removal (26 or more) []  - 0 Hypo / Hyperglycemic Management (close monitor of Blood Glucose) []  - 0 Ankle / Brachial Index (ABI) - do not check if billed separately X- 1 5 Vital Signs Has the patient been seen at the hospital within the last three years: Yes Total Score: 60 Level Of Care: New/Established - Level 2 Electronic Signature(s) Signed: 05/18/2020 11:13:21 AM By: Gretta Cool, BSN, RN, CWS, Kim RN, BSN Entered By: Gretta Cool, BSN, RN, CWS, Kim on 05/18/2020 08:48:08 [name] ([id number]) -------------------------------------------------------------------------------- Encounter Discharge Information Details Patient Name: [name] Date of Service: 05/18/2020 8:15 AM Medical Record Number: [id number] Patient Account Number: [id number] Date of Birth/Sex: Jun 25, 1971 (49 y.o.  F) Treating RN: Cornell Barman Primary Care Tanesha Arambula: Tomasa Hose Other Clinician: Referring Corliss Lamartina: Tomasa Hose Treating Ronie Fleeger/Extender: Tito Dine in Treatment: 7 Encounter Discharge Information Items Discharge Condition: Stable Ambulatory Status: Wheelchair Discharge Destination: Home Transportation: Private Auto Accompanied By: sister, Maudie Mercury Schedule Follow-up Appointment: Yes Clinical Summary of Care: Electronic Signature(s) Signed: 05/18/2020 11:13:21 AM By: Gretta Cool, BSN, RN, CWS, Kim RN, BSN Entered By: Gretta Cool, BSN, RN, CWS, Kim on 05/18/2020 08:49:07 [name] ([id number]) -------------------------------------------------------------------------------- Lower Extremity Assessment Details Patient Name: [name] Date of Service: 05/18/2020 8:15 AM Medical Record Number: [id number] Patient Account Number: [id number] Date of Birth/Sex: 1971/01/21 (49 y.o. F) Treating RN: Cornell Barman Primary Care Margeaux Swantek: Tomasa Hose Other Clinician: Referring Cordarious Zeek: Tomasa Hose Treating Santrice Muzio/Extender: Tito Dine in Treatment: 7 Edema Assessment Assessed: [Left: Yes] [Right: No] Edema: [Left: N] [Right: o] Calf Left: Right: Point of Measurement: 33 cm From Medial Instep 36.5 cm cm Ankle Left: Right: Point of Measurement: 10 cm From Medial Instep 22.6 cm cm Vascular Assessment Pulses: Dorsalis Pedis Palpable: [Left:Yes] Posterior Tibial Palpable: [Left:Yes] Electronic Signature(s) Signed: 05/18/2020 11:13:21 AM By: Gretta Cool, BSN, RN, CWS, Kim RN, BSN Signed: 05/18/2020 11:38:20 AM By: Darci Needle Entered By: Darci Needle on 05/18/2020 08:36:49 [name] ([id number]) -------------------------------------------------------------------------------- Multi Wound Chart Details Patient Name: [name] Date of Service: 05/18/2020 8:15 AM Medical Record Number: [id number] Patient Account Number: [id number] Date of Birth/Sex: May 08, 1971 (49  y.o. F) Treating RN: Cornell Barman Primary Care Guelda Batson: Tomasa Hose Other Clinician: Referring Celenia Hruska: Tomasa Hose Treating Osias Resnick/Extender: Tito Dine in Treatment: 7 Vital Signs Height(in): 64 Pulse(bpm): 92 Weight(lbs): 142 Blood Pressure(mmHg): 98/66 Body Mass Index(BMI): 24 Temperature(F): 98 Respiratory Rate(breaths/min): 18 Photos: [N/A:N/A] Wound Location: Left, Lateral Foot N/A N/A Wounding Event: Surgical Injury N/A N/A Primary Etiology: Open Surgical  Wound N/A N/A Secondary Etiology: Diabetic Wound/Ulcer of the Lower N/A N/A Extremity Comorbid History: Cataracts, Type II Diabetes, N/A N/A Neuropathy Date Acquired: 04/09/2020 N/A N/A Weeks of Treatment: 1 N/A N/A Wound Status: Open N/A N/A Measurements L x W x D (cm) 3.2x9.7x1 N/A N/A Area (cm) : 24.379 N/A N/A Volume (cm) : 24.379 N/A N/A % Reduction in Area: 11.30% N/A N/A % Reduction in Volume: -10.90% N/A N/A Classification: Full Thickness With Exposed N/A N/A Support Structures Exudate Amount: Medium N/A N/A Exudate Type: Serosanguineous N/A N/A Exudate Color: red, brown N/A N/A Wound Margin: Epibole N/A N/A Granulation Amount: Large (67-100%) N/A N/A Granulation Quality: Red N/A N/A Necrotic Amount: Small (1-33%) N/A N/A Exposed Structures: Fat Layer (Subcutaneous Tissue): N/A N/A Yes Fascia: No Tendon: No Muscle: No Joint: No Bone: No Epithelialization: None N/A N/A Treatment Notes Wound #2 (Left, Lateral Foot) Notes wet to dry in clinic. Prisma Ag under NPWT. Black and white foam. [name], [name] ([id number]) Electronic Signature(s) Signed: 05/19/2020 9:14:54 AM By: Linton Ham MD Entered By: Linton Ham on 05/18/2020 08:51:09 [name], [name] ([id number]) -------------------------------------------------------------------------------- [place] Details Patient Name: [name] Date of Service: 05/18/2020 8:15 AM Medical Record Number:  [id number] Patient Account Number: [id number] Date of Birth/Sex: 09/30/1970 (49 y.o. F) Treating RN: Cornell Barman Primary Care Jnae Thomaston: Tomasa Hose Other Clinician: Referring Aliha Diedrich: Tomasa Hose Treating Zackariah Vanderpol/Extender: Tito Dine in Treatment: 7 Active Inactive Orientation to the Wound Care Program Nursing Diagnoses: Knowledge deficit related to the wound healing center program Goals: Patient/caregiver will verbalize understanding of the [place] Program Date Initiated: 03/24/2020 Target Resolution Date: 04/22/2020 Goal Status: Active Interventions: Provide education on orientation to the wound center Notes: Wound/Skin Impairment Nursing Diagnoses: Impaired tissue integrity Goals: Ulcer/skin breakdown will have a volume reduction of 30% by week 4 Date Initiated: 03/24/2020 Target Resolution Date: 04/22/2020 Goal Status: Active Interventions: Assess ulceration(s) every visit Notes: Electronic Signature(s) Signed: 05/18/2020 11:13:21 AM By: Gretta Cool, BSN, RN, CWS, Kim RN, BSN Entered By: Gretta Cool, BSN, RN, CWS, Kim on 05/18/2020 08:46:39 [name] ([id number]) -------------------------------------------------------------------------------- Pain Assessment Details Patient Name: [name] Date of Service: 05/18/2020 8:15 AM Medical Record Number: [id number] Patient Account Number: [id number] Date of Birth/Sex: 02/10/1971 (49 y.o. F) Treating RN: Cornell Barman Primary Care Kerby Hockley: Tomasa Hose Other Clinician: Referring Londyn Wotton: Tomasa Hose Treating Marda Breidenbach/Extender: Tito Dine in Treatment: 7 Active Problems Location of Pain Severity and Description of Pain Patient Has Paino No Site Locations With Dressing Change: No Pain Management and Medication Current Pain Management: Electronic Signature(s) Signed: 05/18/2020 11:13:21 AM By: Gretta Cool, BSN, RN, CWS, Kim RN, BSN Signed: 05/18/2020 11:38:20 AM By: Darci Needle Entered By: Darci Needle on 05/18/2020 08:33:01 [name] ([id number]) -------------------------------------------------------------------------------- Patient/Caregiver Education Details Patient Name: [name] Date of Service: 05/18/2020 8:15 AM Medical Record Number: [id number] Patient Account Number: [id number] Date of Birth/Gender: July 24, 1971 (49 y.o. F) Treating RN: Cornell Barman Primary Care Physician: Tomasa Hose Other Clinician: Referring Physician: Tomasa Hose Treating Physician/Extender: Tito Dine in Treatment: 7 Education Assessment Education Provided To: Patient Education Topics Provided Wound/Skin Impairment: Handouts: Caring for Your Ulcer Methods: Demonstration, Explain/Verbal Responses: State content correctly Electronic Signature(s) Signed: 05/18/2020 11:13:21 AM By: Gretta Cool, BSN, RN, CWS, Kim RN, BSN Entered By: Gretta Cool, BSN, RN, CWS, Kim on 05/18/2020 08:48:28 [name] ([id number]) -------------------------------------------------------------------------------- Wound Assessment Details Patient Name: [name] Date of Service: 05/18/2020 8:15 AM Medical Record Number: [id number] Patient Account  Number: [id number] Date of Birth/Sex: 15-Nov-1970 (49 y.o. F) Treating RN: Cornell Barman Primary Care Trey Bebee: Tomasa Hose Other Clinician: Referring Pearle Wandler: Tomasa Hose Treating Annsley Akkerman/Extender: Tito Dine in Treatment: 7 Wound Status Wound Number: 2 Primary Etiology: Open Surgical Wound Wound Location: Left, Lateral Foot Secondary Etiology: Diabetic Wound/Ulcer of the Lower Extremity Wounding Event: Surgical Injury Wound Status: Open Date Acquired: 04/09/2020 Comorbid History: Cataracts, Type II Diabetes, Neuropathy Weeks Of Treatment: 1 Clustered Wound: No Photos Wound Measurements Length: (cm) 3.2 Width: (cm) 9.7 Depth: (cm) 1 Area: (cm) 24.379 Volume: (cm) 24.379 % Reduction in Area: 11.3% % Reduction in Volume:  -10.9% Epithelialization: None Wound Description Classification: Full Thickness With Exposed Support Structures Wound Margin: Epibole Exudate Amount: Medium Exudate Type: Serosanguineous Exudate Color: red, brown Foul Odor After Cleansing: No Slough/Fibrino Yes Wound Bed Granulation Amount: Large (67-100%) Exposed Structure Granulation Quality: Red Fascia Exposed: No Necrotic Amount: Small (1-33%) Fat Layer (Subcutaneous Tissue) Exposed: Yes Necrotic Quality: Adherent Slough Tendon Exposed: No Muscle Exposed: No Joint Exposed: No Bone Exposed: No Treatment Notes Wound #2 (Left, Lateral Foot) Notes wet to dry in clinic. Prisma Ag under NPWT. Black and white foam. Electronic Signature(s) Signed: 05/18/2020 11:13:21 AM By: Gretta Cool, BSN, RN, CWS, Kim RN, BSN [address], [place]. ([id number]) Signed: 05/18/2020 11:38:20 AM By: Darci Needle Entered By: Darci Needle on 05/18/2020 08:35:22 [name], [name] ([id number]) -------------------------------------------------------------------------------- Vitals Details Patient Name: [name] Date of Service: 05/18/2020 8:15 AM Medical Record Number: [id number] Patient Account Number: [id number] Date of Birth/Sex: 1971-06-22 (49 y.o. F) Treating RN: Cornell Barman Primary Care Emersyn Wyss: Tomasa Hose Other Clinician: Referring Natalie Leclaire: Tomasa Hose Treating Valla Pacey/Extender: Tito Dine in Treatment: 7 Vital Signs Time Taken: 08:20 Temperature (F): 98 Height (in): 64 Pulse (bpm): 92 Weight (lbs): 142 Respiratory Rate (breaths/min): 18 Body Mass Index (BMI): 24.4 Blood Pressure (mmHg): 98/66 Reference Range: 80 - 120 mg / dl Electronic Signature(s) Signed: 05/18/2020 11:38:20 AM By: Darci Needle Entered By: Darci Needle on 05/18/2020 08:32:49

## 2020-05-19 NOTE — Progress Notes (Signed)
[name], [name] ([id number]) Visit Report for 05/18/2020 HPI Details Patient Name: [name], [name] Date of Service: 05/18/2020 8:15 AM Medical Record Number: [id number] Patient Account Number: [id number] Date of Birth/Sex: 03-28-1971 (49 y.o. F) Treating RN: Cornell Barman Primary Care Provider: Tomasa Hose Other Clinician: Referring Provider: Tomasa Hose Treating Provider/Extender: Tito Dine in Treatment: 7 History of Present Illness HPI Description: 03/24/2020 upon evaluation today patient presents for initial inspection here in our clinic concerning the wound on her left lateral foot and the fifth metatarsal region which came up about 2 weeks ago. Subsequently the patient has been seen by her primary care provider who did place her on Bactrim that does seem to have been beneficial. She does have a hemoglobin of 11.1 which was measured on 03/02/2020. She was in the hospital right around that time due to diabetic ketoacidosis for 7 days. Apparently this was due to medication change. At this point she seems to be doing somewhat better which is great news. Fortunately there is no signs of active infection at this time. Patient's culture grew mixed flora that was no definitive diagnosis here as far as the bacteria involved. However the Bactrim does seem to be helping her pain is also greatly improved. Currently the been doing vinegar water soaks along with Dakin's applied to the wound afterwards this is what was used on her heel previous whenever she had an issue. Subsequently the patient has not had vascular evaluation unfortunately her ABIs were somewhat unusual we could not really get a good reading today therefore I question whether she has good arterial flow for that reason I would avoid any aggressive debridement right now until we can get her in for an arterial study with ABI and TBI. She is seen with her sister in the office today as well. 7/15; patient has her arterial studies booked  for 7/21 at [facility]. She is using Santyl to the wound. 04/07/2020 upon evaluation today patient appears to be doing poorly at this point with regard to her wound. Unfortunately this appears to be more erythematous than the last time I saw her and I do believe that she needs antibiotics at this time. There does not appear to be any signs of active infection systemically but locally I am very concerned in this regard. She has been having increased pain and tells me that earlier this week it was very significant. There does not appear to be any signs of active infection systemically at this point as mentioned above. No fevers, chills, nausea, vomiting, or diarrhea. The patient's culture from previous that I reviewed from chart review showed a mixed culture of normal flora that did not reveal any pathologic organisms. Nonetheless she was on Bactrim and it did help at that point. She has been off Bactrim for 2 weeks however. Obviously I do feel like this is an ongoing and continuing infection at this point. 05/11/2020; patient comes back to clinic today after a prolonged stay [facility] from 8/9 through 8/18; she was admitted with a severe infection of the left foot. 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 She was brought back to the OR on 7/26 for debridement and then back again on 7/29 for removal of her necrotic flap and attempted closure. Bone culture was negative at the margins. X-rays prior to surgery showed osteomyelitis in the involved area. She was discharged with a wound VAC which family is actually changing. She follows with  infectious disease Dr. Wandra Scot. 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 She also had fungal cultures done although she is not on any antifungals that I can see. 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 She is not having any problems with systemic symptoms fever or chills. As mentioned family members changing the wound VAC at home. Mild amount of drainage. She has an appointment with her surgeon/podiatry next week 9/1; patient is using a wound VAC the surgical wound on the left foot. We have tried to get silver collagen over the definite bone exposed area superiorly although that did not arrive. She describes no additional symptoms. Minimal drainage. She sees her surgeon this morning Electronic Signature(s) Signed: 05/19/2020 9:14:54 AM By: Linton Ham MD Entered By: Linton Ham on 05/18/2020 08:52:15 [name], [name] ([id number]) -------------------------------------------------------------------------------- Physical Exam Details Patient Name: [name] Date of Service: 05/18/2020 8:15 AM Medical Record Number: [id number] Patient Account Number: [id number] Date of Birth/Sex: 1971/09/01 (49 y.o. F) Treating RN: Cornell Barman Primary Care Provider: Tomasa Hose Other Clinician: Referring Provider: Tomasa Hose Treating Provider/Extender: Tito Dine in Treatment: 7 Constitutional Patient is hypotensive. However she appears well and has no complaints. Pulse regular and within target range for patient.Marland Kitchen Respirations regular, non-labored and within target range.. Temperature is normal and within the target range for the patient.Marland Kitchen appears in no distress. Cardiovascular Both the dorsalis pedis and posterior tibial pulses are palpable. Notes Wound exam; substantial area on the lateral part of the left foot plantar to dorsal. There is 1 area of exposed bone most proximally and a small area of exposed tendon. Most of the great granulation here looks very healthy. There is no evidence of soft  tissue infection. No drainage Electronic Signature(s) Signed: 05/19/2020 9:14:54 AM By: Linton Ham MD Entered By: Linton Ham on 05/18/2020 08:53:20 [name] ([id number]) -------------------------------------------------------------------------------- Physician Orders Details Patient Name: [name] Date of Service: 05/18/2020 8:15 AM Medical Record Number: [id number] Patient Account Number: [id number] Date of Birth/Sex: [birth year] (49 y.o. F) Treating RN: Cornell Barman Primary Care Provider: Tomasa Hose Other Clinician: Referring Provider: Tomasa Hose Treating Provider/Extender: Tito Dine in Treatment: 7 Verbal / Phone Orders: No Diagnosis Coding Wound Cleansing Wound #2 Left,Lateral Foot o Clean wound with Normal Saline. Skin Barriers/Peri-Wound Care Wound #2 Left,Lateral Foot o Skin Prep Primary Wound Dressing Wound #2 Left,Lateral Foot o Saline moistened gauze Secondary Dressing Wound #2 Left,Lateral Foot o ABD and Kerlix/Conform Dressing Change Frequency Wound #2 Left,Lateral Foot o Change Dressing Monday, Wednesday, Friday Follow-up Appointments Wound #2 Left,Lateral Foot o Return Appointment in 1 week. Negative Pressure Wound Therapy Wound #2 Left,Lateral Foot o Wound VAC settings at 125/130 mmHg continuous pressure. Use BLACK/GREEN foam to wound cavity. Use WHITE foam to fill any tunnel/s and/or undermining. Change VAC dressing 3 X WEEK. Change canister as indicated when full. Nurse may titrate settings and frequency of dressing changes as clinically indicated. - Place collagen under wound [place] Nurse may d/c VAC for s/s of increased infection, significant wound regression, or uncontrolled drainage. [place] at [phone]. o Apply contact layer over base of wound. Electronic Signature(s) Signed: 05/18/2020 11:13:21 AM  By: Gretta Cool, BSN, RN, CWS, Kim RN, BSN Signed: 05/19/2020 9:14:54 AM By:  Linton Ham MD Entered By: Gretta Cool, BSN, RN, CWS, Kim on 05/18/2020 08:47:41 [name], [name] ([id number]) -------------------------------------------------------------------------------- Problem List Details Patient Name: [name] Date of Service: 05/18/2020 8:15 AM Medical Record Number: [id number] Patient Account Number: [id number] Date of Birth/Sex: [birth year] (49 y.o. 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/11/2020 No Yes bone T81.31XD Disruption of external operation (surgical) wound, not elsewhere 05/11/2020 No Yes classified, subsequent encounter Inactive Problems ICD-10 Code Description Active Date Inactive Date L97.522 Non-pressure chronic ulcer of other part of left foot with fat layer exposed 03/24/2020 03/24/2020 L03.116 Cellulitis of left lower limb 03/24/2020 03/24/2020 Resolved Problems Electronic Signature(s) Signed: 05/19/2020 9:14:54 AM By: Linton Ham MD Entered By: Linton Ham on 05/18/2020 08:50:59 [name] ([id number]) -------------------------------------------------------------------------------- Progress Note Details Patient Name: [name] Date of Service: 05/18/2020 8:15 AM Medical Record Number: [id number] Patient Account Number: [id number] Date of Birth/Sex: 1971-05-27 (49 y.o. F) Treating RN: Cornell Barman Primary Care Provider: Tomasa Hose Other Clinician: Referring Provider: Tomasa Hose Treating Provider/Extender: Tito Dine in Treatment: 7 Subjective History of Present Illness (HPI) 03/24/2020 upon evaluation today patient presents for initial inspection here in  our clinic concerning the wound on her left lateral foot and the fifth metatarsal region which came up about 2 weeks ago. Subsequently the patient has been seen by her primary care provider who did place her on Bactrim that does seem to have been beneficial. She does have a hemoglobin of 11.1 which was measured on 03/02/2020. She was in the hospital right around that time due to diabetic ketoacidosis for 7 days. Apparently this was due to medication change. At this point she seems to be doing somewhat better which is great news. Fortunately there is no signs of active infection at this time. Patient's culture grew mixed flora that was no definitive diagnosis here as far as the bacteria involved. However the Bactrim does seem to be helping her pain is also greatly improved. Currently the been doing vinegar water soaks along with Dakin's applied to the wound afterwards this is what was used on her heel previous whenever she had an issue. Subsequently the patient has not had vascular evaluation unfortunately her ABIs were somewhat unusual we could not really get a good reading today therefore I question whether she has good arterial flow for that reason I would avoid any aggressive debridement right now until we can get her in for an arterial study with ABI and TBI. She is seen with her sister in the office today as well. 7/15; patient has her arterial studies booked for 7/21 at [facility]. She is using Santyl to the wound. 04/07/2020 upon evaluation today patient appears to be doing poorly at this point with regard to her wound. Unfortunately this appears to be more erythematous than the last time I saw her and I do believe that she needs antibiotics at this time. There does not appear to be any signs of active infection systemically but locally I am very concerned in this regard. She has been having increased pain and tells me that earlier this week it  was very significant. There does not  appear to be any signs of active infection systemically at this point as mentioned above. No fevers, chills, nausea, vomiting, or diarrhea. The patient's culture from previous that I reviewed from chart review showed a mixed culture of normal flora that did not reveal any pathologic organisms. Nonetheless she was on Bactrim and it did help at that point. She has been off Bactrim for 2 weeks however. Obviously I do feel like this is an ongoing and continuing infection at this point. 05/11/2020; patient comes back to clinic today after a prolonged stay [facility] from 8/9 through 8/18; she was admitted with a severe infection of the left foot. 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 She was brought back to the OR on 7/26 for debridement and then back again on 7/29 for removal of her necrotic flap and attempted closure. Bone culture was negative at the margins. X-rays prior to surgery showed osteomyelitis in the involved area. She was discharged with a wound VAC which family is actually changing. She follows with infectious disease Dr. Wandra Scot. 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 She also had fungal cultures done although she is not on any antifungals that I can see. 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 She is not having any problems with systemic symptoms fever or chills. As mentioned family members changing the wound VAC at home. Mild amount of drainage. She has an appointment with her surgeon/podiatry next week 9/1; patient is using a wound VAC the surgical wound on the left foot. We have tried to get silver collagen over the definite bone exposed  area superiorly although that did not arrive. She describes no additional symptoms. Minimal drainage. She sees her surgeon this morning Objective Constitutional Patient is hypotensive. However she appears well and has no complaints. Pulse regular and within target range for patient.Marland Kitchen Respirations regular, non-labored and within target range.. Temperature is normal and within the target range for the patient.Marland Kitchen appears in no distress. Vitals Time Taken: 8:20 AM, Height: 64 in, Weight: 142 lbs, BMI: 24.4, Temperature: 98 F, Pulse: 92 bpm, Respiratory Rate: 18 breaths/min, Blood Pressure: 98/66 mmHg. Cardiovascular Both the dorsalis pedis and posterior tibial pulses are palpable. General Notes: Wound exam; substantial area on the lateral part of the left foot plantar to dorsal. There is 1 area of exposed bone most proximally and a small area of exposed tendon. Most of the great granulation here looks very healthy. There is no evidence of soft tissue infection. No drainage Integumentary (Hair, Skin) [name], [name]. ([id number]) Wound #2 status is Open. Original cause of wound was Surgical Injury. The wound is located on the Left,Lateral Foot. The wound measures 3.2cm length x 9.7cm width x 1cm depth; 24.379cm^2 area and 24.379cm^3 volume. There is Fat Layer (Subcutaneous Tissue) exposed. There is a medium amount of serosanguineous drainage noted. The wound margin is epibole. There is large (67-100%) red granulation within the wound bed. There is a small (1-33%) amount of necrotic tissue within the wound bed including Adherent Slough. 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 Skin Barriers/Peri-Wound  Care: Wound #2 Left,Lateral Foot: Skin Prep Primary Wound Dressing: Wound #2 Left,Lateral Foot: Saline moistened gauze Secondary Dressing: Wound #2 Left,Lateral Foot: ABD and Kerlix/Conform Dressing Change Frequency: Wound #2 Left,Lateral Foot: Change Dressing Monday, Wednesday, Friday Follow-up Appointments: Wound #2 Left,Lateral Foot: Return Appointment in 1 week. Negative Pressure Wound Therapy: Wound #2 Left,Lateral Foot: Wound VAC settings at 125/130 mmHg continuous pressure. Use BLACK/GREEN foam to wound cavity. Use WHITE foam to fill any tunnel/s and/or undermining. Change VAC dressing 3 X WEEK. Change canister as indicated when full. Nurse may titrate settings and frequency of dressing changes as clinically indicated. - Place collagen under wound [place] Nurse may d/c VAC for s/s of increased infection, significant wound regression, or uncontrolled drainage. [place] at [phone]. Apply contact layer over base of wound. 1. At this point I would like to continue with the silver collagen over the area proximally especially the exposed bone. 2. Continue the wound VAC. 3. No evidence of infection. 4. The patient was revascularized in the hospital [left popliteal stent]. pedal pulses were palpable Electronic Signature(s) Signed: 05/19/2020 9:14:54 AM By: Linton Ham MD Entered By: Linton Ham on 05/18/2020 08:54:40 [name] ([id number]) -------------------------------------------------------------------------------- SuperBill Details Patient Name: [name] Date of Service: 05/18/2020 Medical Record Number: [id number] Patient Account Number: [id number] Date of Birth/Sex: [birth year] (49 y.o. F) Treating RN: Cornell Barman Primary Care Provider: Tomasa Hose Other Clinician: Referring Provider: Tomasa Hose Treating Provider/Extender: Tito Dine in Treatment: 7 Diagnosis Coding ICD-10 Codes Code Description E11.621 Type  2 diabetes mellitus with foot ulcer L97.524 Non-pressure chronic ulcer of other part of left foot with necrosis of bone T81.31XD Disruption of external operation (surgical) wound, not elsewhere classified, subsequent encounter Facility Procedures CPT4 Code: [id number] Description: [id number] - WOUND CARE VISIT-LEV 2 EST PT Modifier: Quantity: 1 Physician Procedures CPT4 Code Description: [id number] - WC PHYS LEVEL 3 - EST PT Modifier: Quantity: 1 CPT4 Code Description: ICD-10 Diagnosis Description E11.621 Type 2 diabetes mellitus with foot ulcer L97.524 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

## 2020-05-25 ENCOUNTER — Other Ambulatory Visit: Payer: Self-pay

## 2020-05-25 ENCOUNTER — Encounter: Payer: Medicaid Other | Admitting: Internal Medicine

## 2020-05-25 DIAGNOSIS — E11621 Type 2 diabetes mellitus with foot ulcer: Secondary | ICD-10-CM | POA: Diagnosis not present

## 2020-05-26 NOTE — Progress Notes (Signed)
Crystal, Hayes ([id number]) Visit Report for 05/25/2020 Arrival Information Details Patient Name: [name], [name]. Date of Service: 05/25/2020 2:15 PM Medical Record Number: [id number] Patient Account Number: [id number] Date of Birth/Sex: 07-Jun-1971 (49 y.o. F) Treating RN: Cornell Barman Primary Care Jarrel Knoke: Tomasa Hose Other Clinician: Referring Binyamin Nelis: Tomasa Hose Treating [place] Delauder/Extender: Tito Dine in Treatment: 8 Visit Information History Since Last Visit Added or deleted any medications: No Patient Arrived: Wheel Chair Any new allergies or adverse reactions: No Arrival Time: 14:03 Had a fall or experienced change in No Accompanied By: sister activities of daily living that may affect Transfer Assistance: None risk of falls: Patient Identification Verified: Yes Signs or symptoms of abuse/neglect since last visito No Secondary Verification Process Completed: Yes Hospitalized since last visit: No Patient Requires Transmission-Based No Implantable device outside of the clinic excluding No Precautions: cellular tissue based products placed in the center Patient Has Alerts: Yes since last visit: Patient Alerts: ABI left 0.87 right Has Dressing in Place as Prescribed: Yes 1.19 Pain Present Now: No TBI left 0.65 right 1.08 Electronic Signature(s) Signed: 05/25/2020 3:31:30 PM By: Lorine Bears RCP, RRT, CHT Entered By: Lorine Bears on 05/25/2020 14:04:15 [name] ([id number]) -------------------------------------------------------------------------------- Clinic Level of Care Assessment Details Patient Name: [name] Date of Service: 05/25/2020 2:15 PM Medical Record Number: [id number] Patient Account Number: [id number] Date of Birth/Sex: [birth year] (49 y.o. F) Treating RN: Cornell Barman Primary Care Caster Fayette: Tomasa Hose Other Clinician: Referring Yanissa Michalsky: Tomasa Hose Treating Frederika Hukill/Extender: Tito Dine  in Treatment: 8 Clinic Level of Care Assessment Items TOOL 4 Quantity Score []  - Use when only an EandM is performed on FOLLOW-UP visit 0 ASSESSMENTS - Nursing Assessment / Reassessment X - Reassessment of Co-morbidities (includes updates in patient status) 1 10 X- 1 5 Reassessment of Adherence to Treatment Plan ASSESSMENTS - Wound and Skin Assessment / Reassessment X - Simple Wound Assessment / Reassessment - one wound 1 5 []  - 0 Complex Wound Assessment / Reassessment - multiple wounds []  - 0 Dermatologic / Skin Assessment (not related to wound area) ASSESSMENTS - Focused Assessment []  - Circumferential Edema Measurements - multi extremities 0 []  - 0 Nutritional Assessment / Counseling / Intervention []  - 0 Lower Extremity Assessment (monofilament, tuning fork, pulses) []  - 0 Peripheral Arterial Disease Assessment (using hand held doppler) ASSESSMENTS - Ostomy and/or Continence Assessment and Care []  - Incontinence Assessment and Management 0 []  - 0 Ostomy Care Assessment and Management (repouching, etc.) PROCESS - Coordination of Care X - Simple Patient / Family Education for ongoing care 1 15 []  - 0 Complex (extensive) Patient / Family Education for ongoing care X- 1 10 Staff obtains Programmer, systems, Records, Test Results / Process Orders []  - 0 Staff telephones HHA, Nursing Homes / Clarify orders / etc []  - 0 Routine Transfer to another Facility (non-emergent condition) []  - 0 Routine Hospital Admission (non-emergent condition) []  - 0 New Admissions / Biomedical engineer / Ordering NPWT, Apligraf, etc. []  - 0 Emergency Hospital Admission (emergent condition) X- 1 10 Simple Discharge Coordination []  - 0 Complex (extensive) Discharge Coordination PROCESS - Special Needs []  - Pediatric / Minor Patient Management 0 []  - 0 Isolation Patient Management []  - 0 Hearing / Language / Visual special needs []  - 0 Assessment of Community assistance (transportation, D/C  planning, etc.) []  - 0 Additional assistance / Altered mentation []  - 0 Support Surface(s) Assessment (bed, cushion, seat, etc.) INTERVENTIONS - Wound Cleansing / Measurement Salinger,  Nance Pew ([id number]) X- 1 5 Simple Wound Cleansing - one wound []  - 0 Complex Wound Cleansing - multiple wounds X- 1 5 Wound Imaging (photographs - any number of wounds) []  - 0 Wound Tracing (instead of photographs) X- 1 5 Simple Wound Measurement - one wound []  - 0 Complex Wound Measurement - multiple wounds INTERVENTIONS - Wound Dressings []  - Small Wound Dressing one or multiple wounds 0 []  - 0 Medium Wound Dressing one or multiple wounds X- 1 20 Large Wound Dressing one or multiple wounds []  - 0 Application of Medications - topical []  - 0 Application of Medications - injection INTERVENTIONS - Miscellaneous []  - External ear exam 0 []  - 0 Specimen Collection (cultures, biopsies, blood, body fluids, etc.) []  - 0 Specimen(s) / Culture(s) sent or taken to Lab for analysis []  - 0 Patient Transfer (multiple staff / Civil Service fast streamer / Similar devices) []  - 0 Simple Staple / Suture removal (25 or less) []  - 0 Complex Staple / Suture removal (26 or more) []  - 0 Hypo / Hyperglycemic Management (close monitor of Blood Glucose) []  - 0 Ankle / Brachial Index (ABI) - do not check if billed separately X- 1 5 Vital Signs Has the patient been seen at the hospital within the last three years: Yes Total Score: 95 Level Of Care: New/Established - Level 3 Electronic Signature(s) Signed: 05/25/2020 6:08:23 PM By: Gretta Cool, BSN, RN, CWS, Kim RN, BSN Entered By: Gretta Cool, BSN, RN, CWS, Kim on 05/25/2020 14:26:25 [name] ([id number]) -------------------------------------------------------------------------------- Encounter Discharge Information Details Patient Name: [name] Date of Service: 05/25/2020 2:15 PM Medical Record Number: [id number] Patient Account Number: [id number] Date of Birth/Sex:  1971-02-27 (49 y.o. F) Treating RN: Cornell Barman Primary Care Leron Stoffers: Tomasa Hose Other Clinician: Referring Leanard Dimaio: Tomasa Hose Treating Wilfred Dayrit/Extender: Tito Dine in Treatment: 8 Encounter Discharge Information Items Discharge Condition: Stable Ambulatory Status: Ambulatory Discharge Destination: Home Transportation: Private Auto Accompanied By: self Schedule Follow-up Appointment: Yes Clinical Summary of Care: Electronic Signature(s) Signed: 05/25/2020 6:08:23 PM By: Gretta Cool, BSN, RN, CWS, Kim RN, BSN Entered By: Gretta Cool, BSN, RN, CWS, Kim on 05/25/2020 14:27:36 [name] ([id number]) -------------------------------------------------------------------------------- Lower Extremity Assessment Details Patient Name: [name] Date of Service: 05/25/2020 2:15 PM Medical Record Number: [id number] Patient Account Number: [id number] Date of Birth/Sex: 1971-09-10 (49 y.o. F) Treating RN: Cornell Barman Primary Care Chemeka Filice: Tomasa Hose Other Clinician: Referring Jaiyden Laur: Tomasa Hose Treating Corri Delapaz/Extender: Tito Dine in Treatment: 8 Edema Assessment Assessed: [Left: Yes] [Right: No] Edema: [Left: Ye] [Right: s] Calf Left: Right: Point of Measurement: 33 cm From Medial Instep 34.5 cm cm Ankle Left: Right: Point of Measurement: 10 cm From Medial Instep 23.5 cm cm Vascular Assessment Pulses: Dorsalis Pedis Palpable: [Left:Yes] Posterior Tibial Palpable: [Left:Yes] Electronic Signature(s) Signed: 05/25/2020 6:08:23 PM By: Gretta Cool, BSN, RN, CWS, Kim RN, BSN Signed: 05/26/2020 1:36:53 PM By: Darci Needle Entered By: Darci Needle on 05/25/2020 14:16:33 [name] ([id number]) -------------------------------------------------------------------------------- Multi Wound Chart Details Patient Name: [name] Date of Service: 05/25/2020 2:15 PM Medical Record Number: [id number] Patient Account Number: [id number] Date of Birth/Sex:  September 08, 1971 (49 y.o. F) Treating RN: Cornell Barman Primary Care Arbor Leer: Tomasa Hose Other Clinician: Referring Demontrae Gilbert: Tomasa Hose Treating Zai Chmiel/Extender: Tito Dine in Treatment: 8 Vital Signs Height(in): 64 Pulse(bpm): 89 Weight(lbs): 142 Blood Pressure(mmHg): 105/70 Body Mass Index(BMI): 24 Temperature(F): 98.2 Respiratory Rate(breaths/min): 16 Photos: [N/A:N/A] Wound Location: Left, Lateral Foot N/A N/A Wounding Event: Surgical Injury N/A N/A  Primary Etiology: Open Surgical Wound N/A N/A Secondary Etiology: Diabetic Wound/Ulcer of the Lower N/A N/A Extremity Comorbid History: Cataracts, Type II Diabetes, N/A N/A Neuropathy Date Acquired: 04/09/2020 N/A N/A Weeks of Treatment: 2 N/A N/A Wound Status: Open N/A N/A Measurements L x W x D (cm) 3x10x1.9 N/A N/A Area (cm) : 23.562 N/A N/A Volume (cm) : 44.768 N/A N/A % Reduction in Area: 14.30% N/A N/A % Reduction in Volume: -103.60% N/A N/A Classification: Full Thickness With Exposed N/A N/A Support Structures Exudate Amount: Medium N/A N/A Exudate Type: Serosanguineous N/A N/A Exudate Color: red, brown N/A N/A Wound Margin: Epibole N/A N/A Granulation Amount: Large (67-100%) N/A N/A Granulation Quality: Red N/A N/A Necrotic Amount: Small (1-33%) N/A N/A Exposed Structures: Fat Layer (Subcutaneous Tissue): N/A N/A Yes Fascia: No Tendon: No Muscle: No Joint: No Bone: No Epithelialization: None N/A N/A Treatment Notes Wound #2 (Left, Lateral Foot) Notes wet to dry in clinic. Prisma Ag under NPWT. Black and white foam. LILYMAE, SWIECH ([id number]) Electronic Signature(s) Signed: 05/25/2020 3:44:02 PM By: Linton Ham MD Entered By: Linton Ham on 05/25/2020 14:30:09 DEA, BITTING ([id number]) -------------------------------------------------------------------------------- Loyal Details Patient Name: [name] Date of Service: 05/25/2020 2:15 PM Medical Record  Number: [id number] Patient Account Number: [id number] Date of Birth/Sex: [birth year] (49 y.o. F) Treating RN: Cornell Barman Primary Care Dayvin Aber: Tomasa Hose Other Clinician: Referring Jawara Latorre: Tomasa Hose Treating Cole Eastridge/Extender: Tito Dine in Treatment: 8 Active Inactive Orientation to the Wound Care Program Nursing Diagnoses: Knowledge deficit related to the wound healing center program Goals: Patient/caregiver will verbalize understanding of the [place] Program Date Initiated: 03/24/2020 Target Resolution Date: 04/22/2020 Goal Status: Active Interventions: Provide education on orientation to the wound center Notes: Wound/Skin Impairment Nursing Diagnoses: Impaired tissue integrity Goals: Ulcer/skin breakdown will have a volume reduction of 30% by week 4 Date Initiated: 03/24/2020 Target Resolution Date: 04/22/2020 Goal Status: Active Interventions: Assess ulceration(s) every visit Notes: Electronic Signature(s) Signed: 05/25/2020 6:08:23 PM By: Gretta Cool, BSN, RN, CWS, Kim RN, BSN Entered By: Gretta Cool, BSN, RN, CWS, Kim on 05/25/2020 14:22:48 [name] ([id number]) -------------------------------------------------------------------------------- Pain Assessment Details Patient Name: [name] Date of Service: 05/25/2020 2:15 PM Medical Record Number: [id number] Patient Account Number: [id number] Date of Birth/Sex: 08-30-1971 (49 y.o. F) Treating RN: Cornell Barman Primary Care Benetta Maclaren: Tomasa Hose Other Clinician: Referring Jackee Glasner: Tomasa Hose Treating Sayward Horvath/Extender: Tito Dine in Treatment: 8 Active Problems Location of Pain Severity and Description of Pain Patient Has Paino No Site Locations With Dressing Change: No Pain Management and Medication Current Pain Management: Electronic Signature(s) Signed: 05/25/2020 6:08:23 PM By: Gretta Cool, BSN, RN, CWS, Kim RN, BSN Signed: 05/26/2020 1:36:53 PM By: Darci Needle Entered By:  Darci Needle on 05/25/2020 14:13:55 [name] ([id number]) -------------------------------------------------------------------------------- Patient/Caregiver Education Details Patient Name: [name] Date of Service: 05/25/2020 2:15 PM Medical Record Number: [id number] Patient Account Number: [id number] Date of Birth/Gender: 1971/08/15 (49 y.o. F) Treating RN: Cornell Barman Primary Care Physician: Tomasa Hose Other Clinician: Referring Physician: Tomasa Hose Treating Physician/Extender: Tito Dine in Treatment: 8 Education Assessment Education Provided To: Patient Education Topics Provided Pressure: Handouts: Preventing Pressure Ulcers Methods: Demonstration, Explain/Verbal Responses: State content correctly Wound/Skin Impairment: Handouts: Caring for Your Ulcer Methods: Demonstration, Explain/Verbal Responses: State content correctly Electronic Signature(s) Signed: 05/25/2020 6:08:23 PM By: Gretta Cool, BSN, RN, CWS, Kim RN, BSN Entered By: Gretta Cool, BSN, RN, CWS, Kim on 05/25/2020 14:27:02 ZAYNA, TOSTE ([id number]) -------------------------------------------------------------------------------- Wound Assessment Details Patient  Name: ORALIA, CRIGER Date of Service: 05/25/2020 2:15 PM Medical Record Number: [id number] Patient Account Number: [id number] Date of Birth/Sex: [birth year] (49 y.o. F) Treating RN: Cornell Barman Primary Care Kionna Brier: Tomasa Hose Other Clinician: Referring Sedric Guia: Tomasa Hose Treating Tymeka Privette/Extender: Tito Dine in Treatment: 8 Wound Status Wound Number: 2 Primary Etiology: Open Surgical Wound Wound Location: Left, Lateral Foot Secondary Etiology: Diabetic Wound/Ulcer of the Lower Extremity Wounding Event: Surgical Injury Wound Status: Open Date Acquired: 04/09/2020 Comorbid History: Cataracts, Type II Diabetes, Neuropathy Weeks Of Treatment: 2 Clustered Wound: No Photos Wound Measurements Length: (cm) 3 Width:  (cm) 10 Depth: (cm) 1.9 Area: (cm) 23.562 Volume: (cm) 44.768 % Reduction in Area: 14.3% % Reduction in Volume: -103.6% Epithelialization: None Wound Description Classification: Full Thickness With Exposed Support Structures Wound Margin: Epibole Exudate Amount: Medium Exudate Type: Serosanguineous Exudate Color: red, brown Foul Odor After Cleansing: No Slough/Fibrino Yes Wound Bed Granulation Amount: Large (67-100%) Exposed Structure Granulation Quality: Red Fascia Exposed: No Necrotic Amount: Small (1-33%) Fat Layer (Subcutaneous Tissue) Exposed: Yes Necrotic Quality: Adherent Slough Tendon Exposed: No Muscle Exposed: No Joint Exposed: No Bone Exposed: No Treatment Notes Wound #2 (Left, Lateral Foot) Notes wet to dry in clinic. Prisma Ag under NPWT. Black and white foam. Electronic Signature(s) Signed: 05/25/2020 6:08:23 PM By: Gretta Cool, BSN, RN, CWS, Kim RN, BSN [place], [place] ([id number]) Signed: 05/26/2020 1:36:53 PM By: Darci Needle Entered By: Darci Needle on 05/25/2020 14:15:30 ADESUWA, OSGOOD ([id number]) -------------------------------------------------------------------------------- Vitals Details Patient Name: [name] Date of Service: 05/25/2020 2:15 PM Medical Record Number: [id number] Patient Account Number: [id number] Date of Birth/Sex: 1971/08/28 (49 y.o. F) Treating RN: Cornell Barman Primary Care Lindsi Bayliss: Tomasa Hose Other Clinician: Referring Hannan Hutmacher: Tomasa Hose Treating Emilio Baylock/Extender: Tito Dine in Treatment: 8 Vital Signs Time Taken: 14:00 Temperature (F): 98.2 Height (in): 64 Pulse (bpm): 89 Weight (lbs): 142 Respiratory Rate (breaths/min): 16 Body Mass Index (BMI): 24.4 Blood Pressure (mmHg): 105/70 Reference Range: 80 - 120 mg / dl Electronic Signature(s) Signed: 05/25/2020 3:31:30 PM By: Lorine Bears RCP, RRT, CHT Entered By: Lorine Bears on 05/25/2020 14:04:47

## 2020-05-26 NOTE — Progress Notes (Signed)
[name], [name] ([id number]) Visit Report for 05/25/2020 HPI Details Patient Name: [name], [name]. Date of Service: 05/25/2020 2:15 PM Medical Record Number: [id number] Patient Account Number: [id number] Date of Birth/Sex: 02-28-1971 (49 y.o. F) Treating RN: Cornell Barman Primary Care Provider: Tomasa Hose Other Clinician: Referring Provider: Tomasa Hose Treating Provider/Extender: Tito Dine in Treatment: 8 History of Present Illness HPI Description: 03/24/2020 upon evaluation today patient presents for initial inspection here in our clinic concerning the wound on her left lateral foot and the fifth metatarsal region which came up about 2 weeks ago. Subsequently the patient has been seen by her primary care provider who did place her on Bactrim that does seem to have been beneficial. She does have a hemoglobin of 11.1 which was measured on 03/02/2020. She was in the hospital right around that time due to diabetic ketoacidosis for 7 days. Apparently this was due to medication change. At this point she seems to be doing somewhat better which is great news. Fortunately there is no signs of active infection at this time. Patient's culture grew mixed flora that was no definitive diagnosis here as far as the bacteria involved. However the Bactrim does seem to be helping her pain is also greatly improved. Currently the been doing vinegar water soaks along with Dakin's applied to the wound afterwards this is what was used on her heel previous whenever she had an issue. Subsequently the patient has not had vascular evaluation unfortunately her ABIs were somewhat unusual we could not really get a good reading today therefore I question whether she has good arterial flow for that reason I would avoid any aggressive debridement right now until we can get her in for an arterial study with ABI and TBI. She is seen with her sister in the office today as well. 7/15; patient has her arterial studies booked  for 7/21 at [facility]. She is using Santyl to the wound. 04/07/2020 upon evaluation today patient appears to be doing poorly at this point with regard to her wound. Unfortunately this appears to be more erythematous than the last time I saw her and I do believe that she needs antibiotics at this time. There does not appear to be any signs of active infection systemically but locally I am very concerned in this regard. She has been having increased pain and tells me that earlier this week it was very significant. There does not appear to be any signs of active infection systemically at this point as mentioned above. No fevers, chills, nausea, vomiting, or diarrhea. The patient's culture from previous that I reviewed from chart review showed a mixed culture of normal flora that did not reveal any pathologic organisms. Nonetheless she was on Bactrim and it did help at that point. She has been off Bactrim for 2 weeks however. Obviously I do feel like this is an ongoing and continuing infection at this point. 05/11/2020; patient comes back to clinic today after a prolonged stay [facility] from 8/9 through 8/18; she was admitted with a severe infection of the left foot. 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 She was brought back to the OR on 7/26 for debridement and then back again on 7/29 for removal of her necrotic flap and attempted closure. Bone culture was negative at the margins. X-rays prior to surgery showed osteomyelitis in the involved area. She was discharged with a wound VAC which family is actually changing. She follows with  infectious disease Dr. Wandra Scot. 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 She also had fungal cultures done although she is not on any antifungals that I can see. 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 She is not having any problems with systemic symptoms fever or chills. As mentioned family members changing the wound VAC at home. Mild amount of drainage. She has an appointment with her surgeon/podiatry next week 9/1; patient is using a wound VAC the surgical wound on the left foot. We have tried to get silver collagen over the definite bone exposed area superiorly although that did not arrive. She describes no additional symptoms. Minimal drainage. She sees her surgeon this morning 9/8; patient saw vascular surgery yesterday who told the patient they were satisfied. Sees infectious disease tomorrow hopefully will and IV cefepime and be able to remove her PICC line. We have been trying to use silver collagen under the wound VAC especially over the area anteriorly which is deeper and at 1 point had exposed bone however insurance will not pay for that as long as they are apparently paying for the wound VAC. We will apply it here and give them once left over so at least they can change this once Electronic Signature(s) Signed: 05/25/2020 3:44:02 PM By: Linton Ham MD Entered By: Linton Ham on 05/25/2020 14:31:39 [name] ([id number]) -------------------------------------------------------------------------------- Physical Exam Details Patient Name: [name] Date of Service: 05/25/2020 2:15 PM Medical Record Number: [id number] Patient Account Number: [id number] Date of Birth/Sex: [birth year] (49 y.o. F) Treating RN: Cornell Barman Primary Care Provider: Tomasa Hose Other Clinician: Referring Provider: Tomasa Hose Treating Provider/Extender: Tito Dine in Treatment: 8 Constitutional Sitting or standing Blood Pressure is within target range for patient.. Pulse regular and within  target range for patient.Marland Kitchen Respirations regular, non- labored and within target range.. Temperature is normal and within the target range for the patient.Marland Kitchen appears in no distress. Cardiovascular Pedal pulses are palpable. Integumentary (Hair, Skin) There is no surrounding erythema no evidence of infection. Notes Wound exam; left plantar foot dorsal foot around the lateral margin. She has a deeper area anteriorly. This is the area where there was exposed bone I had difficulty proving that today. I have been attempting to use silver collagen over this area. Electronic Signature(s) Signed: 05/25/2020 3:44:02 PM By: Linton Ham MD Entered By: Linton Ham on 05/25/2020 14:35:43 [name] ([id number]) -------------------------------------------------------------------------------- Physician Orders Details Patient Name: [name] Date of Service: 05/25/2020 2:15 PM Medical Record Number: [id number] Patient Account Number: [id number] Date of Birth/Sex: Nov 01, 1970 (49 y.o. F) Treating RN: Cornell Barman Primary Care Provider: Tomasa Hose Other Clinician: Referring Provider: Tomasa Hose Treating Provider/Extender: Tito Dine in Treatment: 8 Verbal / Phone Orders: No Diagnosis Coding Wound Cleansing Wound #2 Left,Lateral Foot o Clean wound with Normal Saline. Skin Barriers/Peri-Wound Care Wound #2 Left,Lateral Foot o Skin Prep Primary Wound Dressing Wound #2 Left,Lateral Foot o Saline moistened gauze Secondary Dressing Wound #2 Left,Lateral Foot o ABD and Kerlix/Conform Dressing Change Frequency Wound #2 Left,Lateral Foot o Change Dressing Monday, Wednesday, Friday Follow-up Appointments Wound #2 Left,Lateral Foot o Return Appointment in 2 weeks. Off-Loading Wound #2 Left,Lateral Foot o  Other: - NO Pressure on wounds Negative Pressure Wound Therapy Wound #2 Left,Lateral Foot o Wound VAC settings at 125/130 mmHg continuous pressure. Use  BLACK/GREEN foam to wound cavity. Use WHITE foam to fill any tunnel/s and/or undermining. Change VAC dressing 3 X WEEK. Change canister as indicated when full. Nurse may titrate settings and frequency of dressing changes as clinically indicated. - Place collagen under wound [place] Nurse may d/c VAC for s/s of increased infection, significant wound regression, or uncontrolled drainage. [place] at [phone]. o Apply contact layer over base of wound. Electronic Signature(s) Signed: 05/25/2020 3:44:02 PM By: Linton Ham MD Signed: 05/25/2020 6:08:23 PM By: Gretta Cool, BSN, RN, CWS, Kim RN, BSN Entered By: Gretta Cool, BSN, RN, CWS, Kim on 05/25/2020 14:25:50 EADIE, REPETTO ([id number]) -------------------------------------------------------------------------------- Problem List Details Patient Name: [name] Date of Service: 05/25/2020 2:15 PM Medical Record Number: [id number] Patient Account Number: [id number] Date of Birth/Sex: [birth year] (49 y.o. 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/11/2020 No Yes bone T81.31XD Disruption of external operation (surgical) wound, not elsewhere 05/11/2020 No Yes classified, subsequent encounter Inactive Problems ICD-10 Code Description Active Date Inactive Date L97.522 Non-pressure chronic ulcer of other part of left foot with fat layer exposed 03/24/2020 03/24/2020 L03.116 Cellulitis of left lower limb 03/24/2020 03/24/2020 Resolved Problems Electronic Signature(s) Signed: 05/25/2020 3:44:02 PM By: Linton Ham MD Entered By: Linton Ham on 05/25/2020  14:29:52 [name] ([id number]) -------------------------------------------------------------------------------- Progress Note Details Patient Name: [name] Date of Service: 05/25/2020 2:15 PM Medical Record Number: [id number] Patient Account Number: [id number] Date of Birth/Sex: 1971-03-14 (49 y.o. F) Treating RN: Cornell Barman Primary Care Provider: Tomasa Hose Other Clinician: Referring Provider: Tomasa Hose Treating Provider/Extender: Tito Dine in Treatment: 8 Subjective History of Present Illness (HPI) 03/24/2020 upon evaluation today patient presents for initial inspection here in our clinic concerning the wound on her left lateral foot and the fifth metatarsal region which came up about 2 weeks ago. Subsequently the patient has been seen by her primary care provider who did place her on Bactrim that does seem to have been beneficial. She does have a hemoglobin of 11.1 which was measured on 03/02/2020. She was in the hospital right around that time due to diabetic ketoacidosis for 7 days. Apparently this was due to medication change. At this point she seems to be doing somewhat better which is great news. Fortunately there is no signs of active infection at this time. Patient's culture grew mixed flora that was no definitive diagnosis here as far as the bacteria involved. However the Bactrim does seem to be helping her pain is also greatly improved. Currently the been doing vinegar water soaks along with Dakin's applied to the wound afterwards this is what was used on her heel previous whenever she had an issue. Subsequently the patient has not had vascular evaluation unfortunately her ABIs were somewhat unusual we could not really get a good reading today therefore I question whether she has good arterial flow for that reason I would avoid any aggressive debridement right now until we can get her in for an arterial study with ABI and TBI. She is seen with her  sister  in the office today as well. 7/15; patient has her arterial studies booked for 7/21 at [facility]. She is using Santyl to the wound. 04/07/2020 upon evaluation today patient appears to be doing poorly at this point with regard to her wound. Unfortunately this appears to be more erythematous than the last time I saw her and I do believe that she needs antibiotics at this time. There does not appear to be any signs of active infection systemically but locally I am very concerned in this regard. She has been having increased pain and tells me that earlier this week it was very significant. There does not appear to be any signs of active infection systemically at this point as mentioned above. No fevers, chills, nausea, vomiting, or diarrhea. The patient's culture from previous that I reviewed from chart review showed a mixed culture of normal flora that did not reveal any pathologic organisms. Nonetheless she was on Bactrim and it did help at that point. She has been off Bactrim for 2 weeks however. Obviously I do feel like this is an ongoing and continuing infection at this point. 05/11/2020; patient comes back to clinic today after a prolonged stay [facility] from 8/9 through 8/18; she was admitted with a severe infection of the left foot. 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 She was brought back to the OR on 7/26 for debridement and then back again on 7/29 for removal of her necrotic flap and attempted closure. Bone culture was negative at the margins. X-rays prior to surgery showed osteomyelitis in the involved area. She was discharged with a wound VAC which family is actually changing. She follows with infectious disease Dr. Wandra Scot. 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 She also had fungal cultures done although she is not on any antifungals that I can see. 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 She is not having any problems with systemic symptoms fever or chills. As mentioned family members changing the wound VAC at home. Mild amount of drainage. She has an appointment with her surgeon/podiatry next week 9/1; patient is using a wound VAC the surgical wound on the left foot. We have tried to get silver collagen over the definite bone exposed area superiorly although that did not arrive. She describes no additional symptoms. Minimal drainage. She sees her surgeon this morning 9/8; patient saw vascular surgery yesterday who told the patient they were satisfied. Sees infectious disease tomorrow hopefully will and IV cefepime and be able to remove her PICC line. We have been trying to use silver collagen under the wound VAC especially over the area anteriorly which is deeper and at 1 point had exposed bone however insurance will not pay for that as long as they are apparently paying for the wound VAC. We will apply it here and give them once left over so at least they can change this once Objective Constitutional Sitting or standing Blood Pressure is within target range for patient.. Pulse regular and within target range for patient.Marland Kitchen Respirations regular, non- labored and within target range.. Temperature is normal and within the target range for the patient.Marland Kitchen appears  in no distress. Vitals Time Taken: 2:00 PM, Height: 64 in, Weight: 142 lbs, BMI: 24.4, Temperature: 98.2 F, Pulse: 89 bpm, Respiratory Rate: 16 breaths/min, Blood Pressure: 105/70 mmHg. Cardiovascular Pedal pulses are palpable. General Notes: Wound exam; left plantar foot dorsal foot around the lateral margin. She has a deeper area anteriorly. This is the  area where [place]. ([phone]) there was exposed bone I had difficulty proving that today. I have been attempting to use silver collagen over this area. Integumentary (Hair, Skin) There is no surrounding erythema no evidence of infection. Wound #2 status is Open. Original cause of wound was Surgical Injury. The wound is located on the Left,Lateral Foot. The wound measures 3cm length x 10cm width x 1.9cm depth; 23.562cm^2 area and 44.768cm^3 volume. There is Fat Layer (Subcutaneous Tissue) exposed. There is a medium amount of serosanguineous drainage noted. The wound margin is epibole. There is large (67-100%) red granulation within the wound bed. There is a small (1-33%) amount of necrotic tissue within the wound bed including Adherent Slough. 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 Skin Barriers/Peri-Wound Care: Wound #2 Left,Lateral Foot: Skin Prep Primary Wound Dressing: Wound #2 Left,Lateral Foot: Saline moistened gauze Secondary Dressing: Wound #2 Left,Lateral Foot: ABD and Kerlix/Conform Dressing Change Frequency: Wound #2 Left,Lateral Foot: Change Dressing Monday, Wednesday, Friday Follow-up Appointments: Wound #2 Left,Lateral Foot: Return Appointment in 2 weeks. Off-Loading: Wound #2 Left,Lateral Foot: Other: - NO Pressure on wounds Negative Pressure Wound Therapy: Wound #2 Left,Lateral Foot: Wound VAC settings at 125/130 mmHg continuous pressure. Use BLACK/GREEN foam to wound cavity. Use WHITE foam to fill any tunnel/s and/or undermining. Change VAC dressing 3 X WEEK. Change canister as indicated when full. Nurse may titrate settings and frequency of dressing changes as  clinically indicated. - Place collagen under wound [place] Nurse may d/c VAC for s/s of increased infection, significant wound regression, or uncontrolled drainage. [place] at [phone]. Apply contact layer over base of wound. 1. Continue the wound VAC for the foreseeable future or at least you are absolutely convinced that there is no further benefit. Silver collagen that we supply when he is here over the deeper area anteriorly and we give him a piece for 1 change. Insurance would not cover this otherwise 2. Hopefully this will continue to come in in the anterior part to fill in. 3. Follow-up in 2 weeks Electronic Signature(s) Signed: 05/25/2020 3:44:02 PM By: Linton Ham MD Entered By: Linton Ham on 05/25/2020 14:39:27 [name] ([id number]) -------------------------------------------------------------------------------- SuperBill Details Patient Name: [name] Date of Service: 05/25/2020 Medical Record Number: [id number] Patient Account Number: [id number] Date of Birth/Sex: [birth year] (49 y.o. 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 T81.31XD Disruption of external operation (surgical) wound, not elsewhere classified, subsequent encounter Facility Procedures CPT4 Code: [id number] Description: 99213 - WOUND CARE VISIT-LEV 3 EST PT Modifier: Quantity: 1 Physician Procedures CPT4 Code: [id number] Description: 99213 - WC PHYS LEVEL 3 - EST PT Modifier: Quantity: 1 CPT4  Code: Description: ICD-10 Diagnosis Description L97.524 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

## 2020-06-08 ENCOUNTER — Encounter: Payer: Medicaid Other | Admitting: Internal Medicine

## 2020-06-08 ENCOUNTER — Other Ambulatory Visit: Payer: Self-pay

## 2020-06-13 NOTE — Progress Notes (Signed)
ALLEYAH, TWOMBLY ([id number]) Visit Report for 06/08/2020 HPI Details Patient Name: [name], [name] Date of Service: 06/08/2020 3:15 PM Medical Record Number: [id number] Patient Account Number: [id number] Date of Birth/Sex: [birth year] (49 y.o. F) Treating RN: Grover Canavan Primary Care Provider: Tomasa Hose Other Clinician: Referring Provider: Tomasa Hose Treating Provider/Extender: Tito Dine in Treatment: 10 History of Present Illness HPI Description: 03/24/2020 upon evaluation today patient presents for initial inspection here in our clinic concerning the wound on her left lateral foot and the fifth metatarsal region which came up about 2 weeks ago. Subsequently the patient has been seen by her primary care provider who did place her on Bactrim that does seem to have been beneficial. She does have a hemoglobin of 11.1 which was measured on 03/02/2020. She was in the hospital right around that time due to diabetic ketoacidosis for 7 days. Apparently this was due to medication change. At this point she seems to be doing somewhat better which is great news. Fortunately there is no signs of active infection at this time. Patient's culture grew mixed flora that was no definitive diagnosis here as far as the bacteria involved. However the Bactrim does seem to be helping her pain is also greatly improved. Currently the been doing vinegar water soaks along with Dakin's applied to the wound afterwards this is what was used on her heel previous whenever she had an issue. Subsequently the patient has not had vascular evaluation unfortunately her ABIs were somewhat unusual we could not really get a good reading today therefore I question whether she has good arterial flow for that reason I would avoid any aggressive debridement right now until we can get her in for an arterial study with ABI and TBI. She is seen with her sister in the office today as well. 7/15; patient has her arterial studies  booked for 7/21 at [facility]. She is using Santyl to the wound. 04/07/2020 upon evaluation today patient appears to be doing poorly at this point with regard to her wound. Unfortunately this appears to be more erythematous than the last time I saw her and I do believe that she needs antibiotics at this time. There does not appear to be any signs of active infection systemically but locally I am very concerned in this regard. She has been having increased pain and tells me that earlier this week it was very significant. There does not appear to be any signs of active infection systemically at this point as mentioned above. No fevers, chills, nausea, vomiting, or diarrhea. The patient's culture from previous that I reviewed from chart review showed a mixed culture of normal flora that did not reveal any pathologic organisms. Nonetheless she was on Bactrim and it did help at that point. She has been off Bactrim for 2 weeks however. Obviously I do feel like this is an ongoing and continuing infection at this point. 05/11/2020; patient comes back to clinic today after a prolonged stay [facility] from 8/9 through 8/18; she was admitted with a severe infection of the left foot. 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 She was brought back to the OR on 7/26 for debridement and then back again on 7/29 for removal of her necrotic flap and attempted closure. Bone culture was negative at the margins. X-rays prior to surgery showed osteomyelitis in the involved area. She was discharged with a wound VAC which family is actually changing. She follows with  infectious disease Dr. Wandra Scot. 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 She also had fungal cultures done although she is not on any antifungals that I can see. 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 She is not having any problems with systemic symptoms fever or chills. As mentioned family members changing the wound VAC at home. Mild amount of drainage. She has an appointment with her surgeon/podiatry next week 9/1; patient is using a wound VAC the surgical wound on the left foot. We have tried to get silver collagen over the definite bone exposed area superiorly although that did not arrive. She describes no additional symptoms. Minimal drainage. She sees her surgeon this morning 9/8; patient saw vascular surgery yesterday who told the patient they were satisfied. Sees infectious disease tomorrow hopefully will and IV cefepime and be able to remove her PICC line. We have been trying to use silver collagen under the wound VAC especially over the area anteriorly which is deeper and at 1 point had exposed bone however insurance will not pay for that as long as they are apparently paying for the wound VAC. We will apply it here and give them once left over so at least they can change this once 9/22; the patient followed up with infectious disease and is now off antibiotics and the PICC line removed. Also followed with vascular surgery with improvements in the ABIs although I do not see the numbers. Is quoted that the left ABIs and TBI's appear increased compared to the prior exam of 7/31. She is also following by her podiatric surgeon Dr. Janus Molder. He has seen her on 9/1 and 9/15. On 9/15 she went underwent a reasonable debridement of the surface of this. Apparently he is also considering ordering a advanced treatment product/skin soft although I do not see this quoted. Finally she developed a blister on top of her foot apparently caused by the VAC tubing. Podiatry recommended Betadine and gauze and 3 or  4 days off the VAC. They resumed this 2 days ago. Electronic Signature(s) Signed: 06/13/2020 9:55:55 AM By: Linton Ham MD Entered By: Linton Ham on 06/08/2020 15:51:58 [name] ([id number]) -------------------------------------------------------------------------------- Physical Exam Details Patient Name: [name] Date of Service: 06/08/2020 3:15 PM Medical Record Number: [id number] Patient Account Number: [id number] Date of Birth/Sex: May 19, 1971 (49 y.o. F) Treating RN: Grover Canavan Primary Care Provider: Tomasa Hose Other Clinician: Referring Provider: Tomasa Hose Treating Provider/Extender: Tito Dine in Treatment: 10 Cardiovascular pedal pulses are palpable on the left. Notes Wound exam; wound on the left dorsal foot extending into the plantar foot. The deepest areas are part of the dorsal wound. Here there is easily probes to bone with a Q-tip. There is no overt soft tissue infection around the wound no purulence Electronic Signature(s) Signed: 06/13/2020 9:55:55 AM By: Linton Ham MD Entered By: Linton Ham on 06/08/2020 15:53:28 [name] ([id number]) -------------------------------------------------------------------------------- Physician Orders Details Patient Name: [name] Date of Service: 06/08/2020 3:15 PM Medical Record Number: [id number] Patient Account Number: [id number] Date of Birth/Sex: Aug 21, 1971 (49 y.o. F) Treating RN: Grover Canavan Primary Care Provider: Tomasa Hose Other Clinician: Referring Provider: Tomasa Hose Treating Provider/Extender: Ricard Dillon  Weeks in Treatment: 10 Verbal / Phone Orders: No Diagnosis Coding Wound Cleansing Wound #2 Left,Lateral Foot o Clean wound with Normal Saline. Skin Barriers/Peri-Wound Care Wound #2 Left,Lateral Foot o Skin Prep Primary Wound Dressing Wound #2 Left,Lateral Foot o Saline moistened gauze Secondary Dressing Wound #2 Left,Lateral  Foot o ABD and Kerlix/Conform Dressing Change Frequency Wound #2 Left,Lateral Foot o Change Dressing Monday, Wednesday, Friday Follow-up Appointments Wound #2 Left,Lateral Foot o Return Appointment in 2 weeks. Off-Loading Wound #2 Left,Lateral Foot o Other: - NO Pressure on wounds Negative Pressure Wound Therapy Wound #2 Left,Lateral Foot o Wound VAC settings at 125/130 mmHg continuous pressure. Use BLACK/GREEN foam to wound cavity. Use WHITE foam to fill any tunnel/s and/or undermining. Change VAC dressing 3 X WEEK. Change canister as indicated when full. Nurse may titrate settings and frequency of dressing changes as clinically indicated. - Place collagen under wound [place] Nurse may d/c VAC for s/s of increased infection, significant wound regression, or uncontrolled drainage. [place] at [phone]. o Apply contact layer over base of wound. Electronic Signature(s) Signed: 06/08/2020 4:32:30 PM By: Grover Canavan Signed: 06/13/2020 9:55:55 AM By: Linton Ham MD Entered By: Grover Canavan on 06/08/2020 15:28:52 LESLEA, VOWLES ([id number]) -------------------------------------------------------------------------------- Problem List Details Patient Name: [name] Date of Service: 06/08/2020 3:15 PM Medical Record Number: [id number] Patient Account Number: [id number] Date of Birth/Sex: Aug 27, 1971 (49 y.o. 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/11/2020 No Yes bone T81.31XD Disruption of external operation (surgical) wound, not elsewhere 05/11/2020 No  Yes classified, subsequent encounter Inactive Problems ICD-10 Code Description Active Date Inactive Date L97.522 Non-pressure chronic ulcer of other part of left foot with fat layer exposed 03/24/2020 03/24/2020 L03.116 Cellulitis of left lower limb 03/24/2020 03/24/2020 Resolved Problems Electronic Signature(s) Signed: 06/13/2020 9:55:55 AM By: Linton Ham MD Entered By: Linton Ham on 06/08/2020 15:48:25 [name] ([id number]) -------------------------------------------------------------------------------- Progress Note Details Patient Name: [name] Date of Service: 06/08/2020 3:15 PM Medical Record Number: [id number] Patient Account Number: [id number] Date of Birth/Sex: 03/30/1971 (49 y.o. F) Treating RN: Grover Canavan Primary Care Provider: Tomasa Hose Other Clinician: Referring Provider: Tomasa Hose Treating Provider/Extender: Tito Dine in Treatment: 10 Subjective History of Present Illness (HPI) 03/24/2020 upon evaluation today patient presents for initial inspection here in our clinic concerning the wound on her left lateral foot and the fifth metatarsal region which came up about 2 weeks ago. Subsequently the patient has been seen by her primary care provider who did place her on Bactrim that does seem to have been beneficial. She does have a hemoglobin of 11.1 which was measured on 03/02/2020. She was in the hospital right around that time due to diabetic ketoacidosis for 7 days. Apparently this was due to medication change. At this point she seems to be doing somewhat better which is great news. Fortunately there is no signs of active infection at this time. Patient's culture grew mixed flora that was no definitive diagnosis here as far as the bacteria involved. However the Bactrim does seem to be helping her pain is also greatly improved. Currently the been doing vinegar water soaks along with Dakin's applied to the wound  afterwards this is what was  used on her heel previous whenever she had an issue. Subsequently the patient has not had vascular evaluation unfortunately her ABIs were somewhat unusual we could not really get a good reading today therefore I question whether she has good arterial flow for that reason I would avoid any aggressive debridement right now until we can get her in for an arterial study with ABI and TBI. She is seen with her sister in the office today as well. 7/15; patient has her arterial studies booked for 7/21 at [facility]. She is using Santyl to the wound. 04/07/2020 upon evaluation today patient appears to be doing poorly at this point with regard to her wound. Unfortunately this appears to be more erythematous than the last time I saw her and I do believe that she needs antibiotics at this time. There does not appear to be any signs of active infection systemically but locally I am very concerned in this regard. She has been having increased pain and tells me that earlier this week it was very significant. There does not appear to be any signs of active infection systemically at this point as mentioned above. No fevers, chills, nausea, vomiting, or diarrhea. The patient's culture from previous that I reviewed from chart review showed a mixed culture of normal flora that did not reveal any pathologic organisms. Nonetheless she was on Bactrim and it did help at that point. She has been off Bactrim for 2 weeks however. Obviously I do feel like this is an ongoing and continuing infection at this point. 05/11/2020; patient comes back to clinic today after a prolonged stay [facility] from 8/9 through 8/18; she was admitted with a severe infection of the left foot. 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 She was brought back to the OR on 7/26 for debridement and then back again on 7/29 for removal of her necrotic flap and attempted closure. Bone culture was negative at the  margins. X-rays prior to surgery showed osteomyelitis in the involved area. She was discharged with a wound VAC which family is actually changing. She follows with infectious disease Dr. Wandra Scot. 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 She also had fungal cultures done although she is not on any antifungals that I can see. 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 She is not having any problems with systemic symptoms fever or chills. As mentioned family members changing the wound VAC at home. Mild amount of drainage. She has an appointment with her surgeon/podiatry next week 9/1; patient is using a wound VAC the surgical wound on the left foot. We have tried to get silver collagen over the definite bone exposed area superiorly although that did not arrive. She describes no additional symptoms. Minimal drainage. She sees her surgeon this morning 9/8; patient saw vascular surgery yesterday who told the patient they were satisfied. Sees infectious disease tomorrow hopefully will and IV cefepime and be able to remove her PICC line. We have been trying to use silver collagen under the wound VAC especially over the area anteriorly which is deeper and at 1 point had exposed bone however  insurance will not pay for that as long as they are apparently paying for the wound VAC. We will apply it here and give them once left over so at least they can change this once 9/22; the patient followed up with infectious disease and is now off antibiotics and the PICC line removed. Also followed with vascular surgery with improvements in the ABIs although I do not see the numbers. Is quoted that the left ABIs and  TBI's appear increased compared to the prior exam of 7/31. She is also following by her podiatric surgeon Dr. Janus Molder. He has seen her on 9/1 and 9/15. On 9/15 she went underwent a reasonable debridement of the surface of this. Apparently he is also considering ordering a advanced treatment product/skin soft although I do not see this quoted. Finally she developed a blister on top of her foot apparently caused by the VAC tubing. Podiatry recommended Betadine and gauze and 3 or 4 days off the VAC. They resumed this 2 days ago. Objective Constitutional Vitals Time Taken: 3:05 PM, Height: 64 in, Weight: 142 lbs, BMI: 24.4, Temperature: 98.4 F, Pulse: 86 bpm, Respiratory Rate: 16 breaths/min, Blood Pressure: 108/62 mmHg. Cardiovascular pedal pulses are palpable on the left. BRONTE, SABADO. ([id number]) General Notes: Wound exam; wound on the left dorsal foot extending into the plantar foot. The deepest areas are part of the dorsal wound. Here there is easily probes to bone with a Q-tip. There is no overt soft tissue infection around the wound no purulence Integumentary (Hair, Skin) Wound #2 status is Open. Original cause of wound was Surgical Injury. The wound is located on the Left,Lateral Foot. The wound measures 3.2cm length x 14cm width x 1.8cm depth; 35.186cm^2 area and 63.335cm^3 volume. There is Fat Layer (Subcutaneous Tissue) exposed. There is a medium amount of serosanguineous drainage noted. The wound margin is epibole. There is large (67-100%) red granulation within the wound bed. There is a small (1-33%) amount of necrotic tissue within the wound bed including Adherent Slough. Wound #3 status is Open. Original cause of wound was Pressure Injury. The wound is located on the Left,Dorsal Foot. The wound measures 2.5cm length x 1.5cm width x 0.1cm depth; 2.945cm^2 area and 0.295cm^3 volume. There is Fat Layer (Subcutaneous Tissue) exposed. There is no tunneling or undermining noted. There  is a medium amount of serous drainage noted. The wound margin is distinct with the outline attached to the wound base. There is medium (34-66%) pink granulation within the wound bed. There is a small (1-33%) amount of necrotic tissue within the wound bed including Adherent Slough. 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 Skin Barriers/Peri-Wound Care: Wound #2 Left,Lateral Foot: Skin Prep Primary Wound Dressing: Wound #2 Left,Lateral Foot: Saline moistened gauze Secondary Dressing: Wound #2 Left,Lateral Foot: ABD and Kerlix/Conform Dressing Change Frequency: Wound #2 Left,Lateral Foot: Change Dressing Monday, Wednesday, Friday Follow-up Appointments: Wound #2 Left,Lateral Foot: Return Appointment in 2 weeks. Off-Loading: Wound #2 Left,Lateral Foot: Other: - NO Pressure on wounds Negative Pressure Wound Therapy: Wound #2 Left,Lateral Foot: Wound VAC settings at 125/130 mmHg continuous pressure. Use BLACK/GREEN foam to wound cavity. Use WHITE foam to fill any tunnel/s and/or undermining. Change VAC dressing 3 X WEEK. Change canister as indicated when full. Nurse may titrate  settings and frequency of dressing changes as clinically indicated. - Place collagen under wound [place] Nurse may d/c VAC for s/s of increased infection, significant wound regression, or uncontrolled drainage. [place] at [phone]. Apply contact layer over base of wound. 1. The patient is essentially attending to clinics. Her sister who is a Marine scientist at [employer] and is changing the wound VAC tells me this is because [facility] Medicaid insisted on weekly measurements 2. So far I do not really see any  major improvement in this area. Most disappointingly we have not been able to ensure adequate coverage of the bone on the dorsal part of her foot. I can easily probe through slight necrotic tissue. 3. This was extensively debrided last week by podiatry. I did not repeat this today 4. My thoughts would be that I think the response to the wound VAC so far is disappointing. 1 would wonder about the status of the underlying bone in the foot dorsally. I cannot see that this has been imaged. She did have lab work on 9/7 that showed a normal white count and differential I did not see any inflammatory markers. RAHEL, CARLTON. ([id number]) 5. Finally it is difficult not to wonder about a Dermagraft on this area but I wonder about whether we would be able to get this through the new [facility] or anything else. 6. I think it is pointless for this patient to be coming to 2 clinics. She sees her surgeon next week and if they are going to follow this at [place] and I think it is reasonable for her to be followed there. They are going to call us back next week and see if there is any need for follow- up here Electronic Signature(s) Signed: 06/13/2020 9:55:55 AM By: Linton Ham MD Entered By: Linton Ham on 06/08/2020 15:57:43 [name] ([id number]) -------------------------------------------------------------------------------- SuperBill Details Patient Name: [name] Date of Service: 06/08/2020 Medical Record Number: [id number] Patient Account Number: [id number] Date of Birth/Sex: 12-25-1970 (49 y.o. 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 of  bone T81.31XD Disruption of external operation (surgical) wound, not elsewhere classified, subsequent encounter Physician Procedures CPT4 Code Description: [id number] - WC PHYS LEVEL 3 - EST PT Modifier: Quantity: 1 CPT4 Code Description: ICD-10 Diagnosis Description E11.621 Type 2 diabetes mellitus with foot ulcer L97.524 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

## 2020-06-13 NOTE — Progress Notes (Signed)
[name], [name] ([id number]) Visit Report for 06/08/2020 Arrival Information Details Patient Name: [name], [name] Date of Service: 06/08/2020 3:15 PM Medical Record Number: [id number] Patient Account Number: [id number] Date of Birth/Sex: [birth year] (49 y.o. F) Treating RN: Grover Canavan Primary Care Thena Devora: Tomasa Hose Other Clinician: Referring Anyeli Hockenbury: Tomasa Hose Treating Kadesia Robel/Extender: Tito Dine in Treatment: 10 Visit Information History Since Last Visit Added or deleted any medications: No Patient Arrived: Wheel Chair Any new allergies or adverse reactions: No Arrival Time: 15:06 Had a fall or experienced change in No Accompanied By: self activities of daily living that may affect Transfer Assistance: None risk of falls: Patient Identification Verified: Yes Signs or symptoms of abuse/neglect since last visito No Secondary Verification Process Completed: Yes Hospitalized since last visit: No Patient Requires Transmission-Based No Implantable device outside of the clinic excluding No Precautions: cellular tissue based products placed in the center Patient Has Alerts: Yes since last visit: Patient Alerts: ABI left 0.87 right Has Dressing in Place as Prescribed: Yes 1.19 Has Compression in Place as Prescribed: Yes TBI left 0.65 right Pain Present Now: No 1.08 Electronic Signature(s) Signed: 06/08/2020 4:13:50 PM By: Lorine Bears RCP, RRT, CHT Entered By: Lorine Bears on 06/08/2020 15:08:01 [name] ([id number]) -------------------------------------------------------------------------------- Clinic Level of Care Assessment Details Patient Name: [name] Date of Service: 06/08/2020 3:15 PM Medical Record Number: [id number] Patient Account Number: [id number] Date of Birth/Sex: Jan 20, 1971 (49 y.o. F) Treating RN: Grover Canavan Primary Care Kegan Mckeithan: Tomasa Hose Other Clinician: Referring Neila Teem: Tomasa Hose Treating Mung Rinker/Extender: Tito Dine in Treatment: 10 Clinic Level of Care Assessment Items TOOL 4 Quantity Score []  - Use when only an EandM is performed on FOLLOW-UP visit 0 ASSESSMENTS - Nursing Assessment / Reassessment X - Reassessment of Co-morbidities (includes updates in patient status) 1 10 X- 1 5 Reassessment of Adherence to Treatment Plan ASSESSMENTS - Wound and Skin Assessment / Reassessment []  - Simple Wound Assessment / Reassessment - one wound 0 X- 2 5 Complex Wound Assessment / Reassessment - multiple wounds []  - 0 Dermatologic / Skin Assessment (not related to wound area) ASSESSMENTS - Focused Assessment []  - Circumferential Edema Measurements - multi extremities 0 []  - 0 Nutritional Assessment / Counseling / Intervention X- 1 5 Lower Extremity Assessment (monofilament, tuning fork, pulses) []  - 0 Peripheral Arterial Disease Assessment (using hand held doppler) ASSESSMENTS - Ostomy and/or Continence Assessment and Care []  - Incontinence Assessment and Management 0 []  - 0 Ostomy Care Assessment and Management (repouching, etc.) PROCESS - Coordination of Care X - Simple Patient / Family Education for ongoing care 1 15 []  - 0 Complex (extensive) Patient / Family Education for ongoing care []  - 0 Staff obtains Programmer, systems, Records, Test Results / Process Orders []  - 0 Staff telephones HHA, Nursing Homes / Clarify orders / etc []  - 0 Routine Transfer to another Facility (non-emergent condition) []  - 0 Routine Hospital Admission (non-emergent condition) []  - 0 New Admissions / Biomedical engineer / Ordering NPWT, Apligraf, etc. []  - 0 Emergency Hospital Admission (emergent condition) X- 1 10 Simple Discharge Coordination []  - 0 Complex (extensive) Discharge Coordination PROCESS - Special Needs []  - Pediatric / Minor Patient Management 0 []  - 0 Isolation Patient Management []  - 0 Hearing / Language / Visual special needs []  -  0 Assessment of Community assistance (transportation, D/C planning, etc.) []  - 0 Additional assistance / Altered mentation []  - 0 Support Surface(s) Assessment (bed, cushion, seat, etc.) INTERVENTIONS -  Wound Cleansing / Measurement [name], CARILLO. ([id number]) []  - 0 Simple Wound Cleansing - one wound X- 2 5 Complex Wound Cleansing - multiple wounds []  - 0 Wound Imaging (photographs - any number of wounds) X- 1 5 Wound Tracing (instead of photographs) []  - 0 Simple Wound Measurement - one wound X- 2 5 Complex Wound Measurement - multiple wounds INTERVENTIONS - Wound Dressings []  - Small Wound Dressing one or multiple wounds 0 X- 2 15 Medium Wound Dressing one or multiple wounds []  - 0 Large Wound Dressing one or multiple wounds []  - 0 Application of Medications - topical []  - 0 Application of Medications - injection INTERVENTIONS - Miscellaneous []  - External ear exam 0 []  - 0 Specimen Collection (cultures, biopsies, blood, body fluids, etc.) []  - 0 Specimen(s) / Culture(s) sent or taken to Lab for analysis []  - 0 Patient Transfer (multiple staff / Civil Service fast streamer / Similar devices) []  - 0 Simple Staple / Suture removal (25 or less) []  - 0 Complex Staple / Suture removal (26 or more) []  - 0 Hypo / Hyperglycemic Management (close monitor of Blood Glucose) []  - 0 Ankle / Brachial Index (ABI) - do not check if billed separately X- 1 5 Vital Signs Has the patient been seen at the hospital within the last three years: Yes Total Score: 115 Level Of Care: New/Established - Level 3 Electronic Signature(s) Signed: 06/08/2020 4:32:30 PM By: Grover Canavan Entered By: Grover Canavan on 06/08/2020 15:57:51 [name] ([id number]) -------------------------------------------------------------------------------- Encounter Discharge Information Details Patient Name: [name] Date of Service: 06/08/2020 3:15 PM Medical Record Number: [id number] Patient Account  Number: [id number] Date of Birth/Sex: 06-09-1971 (49 y.o. F) Treating RN: Grover Canavan Primary Care Rayaan Lorah: Tomasa Hose Other Clinician: Referring Sofie Schendel: Tomasa Hose Treating Myrl Lazarus/Extender: Tito Dine in Treatment: 10 Encounter Discharge Information Items Discharge Condition: Stable Ambulatory Status: Wheelchair Discharge Destination: Home Transportation: Private Auto Accompanied By: sister Schedule Follow-up Appointment: Yes Clinical Summary of Care: Electronic Signature(s) Signed: 06/08/2020 4:32:30 PM By: Grover Canavan Entered By: Grover Canavan on 06/08/2020 15:25:16 [name] ([id number]) -------------------------------------------------------------------------------- Lower Extremity Assessment Details Patient Name: [name] Date of Service: 06/08/2020 3:15 PM Medical Record Number: [id number] Patient Account Number: [id number] Date of Birth/Sex: 18-Aug-1971 (49 y.o. F) Treating RN: Grover Canavan Primary Care Jonny Dearden: Tomasa Hose Other Clinician: Referring Kinsler Soeder: Tomasa Hose Treating Guhan Bruington/Extender: Tito Dine in Treatment: 10 Edema Assessment Assessed: [Left: Yes] [Right: No] Edema: [Left: N] [Right: o] Calf Left: Right: Point of Measurement: 33 cm From Medial Instep 33 cm cm Ankle Left: Right: Point of Measurement: 10 cm From Medial Instep 23.5 cm cm Vascular Assessment Pulses: Dorsalis Pedis Palpable: [Left:Yes] Posterior Tibial Palpable: [Left:Yes] Electronic Signature(s) Signed: 06/08/2020 4:22:24 PM By: Darci Needle Signed: 06/08/2020 4:32:30 PM By: Grover Canavan Entered By: Darci Needle on 06/08/2020 15:29:30 [name] ([id number]) -------------------------------------------------------------------------------- Multi Wound Chart Details Patient Name: [name] Date of Service: 06/08/2020 3:15 PM Medical Record Number: [id number] Patient Account Number: [id number] Date of  Birth/Sex: 12/21/1970 (49 y.o. 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/A Extremity Comorbid History: Cataracts, Type II Diabetes, Cataracts, Type II Diabetes, N/A Neuropathy Neuropathy Date Acquired: 04/09/2020 06/01/2020 N/A Weeks of Treatment: 4 0 N/A Wound Status: Open Open N/A Measurements L x W x D (cm) 3.2x14x1.8 2.5x1.5x0.1 N/A Area (cm) : 35.186 2.945 N/A Volume (cm) : 63.335 0.295 N/A % Reduction in Area: -28.00% N/A N/A % Reduction in Volume: -188.00% N/A N/A Classification: Full Thickness With Exposed Category/Stage II N/A Support Structures Exudate Amount: Medium Medium N/A Exudate Type: Serosanguineous Serous N/A Exudate Color: red, brown amber N/A Wound Margin: Epibole Distinct, outline attached N/A Granulation Amount: Large (67-100%) Medium (34-66%) N/A Granulation Quality: Red Pink N/A Necrotic Amount: Small (1-33%) Small (1-33%) N/A Exposed Structures: Fat Layer (Subcutaneous Tissue): Fat Layer (Subcutaneous Tissue): N/A Yes Yes Fascia: No Fascia: No Tendon: No Tendon: No Muscle: No Muscle: No Joint: No Joint: No Bone: No Bone: No Epithelialization: None Medium (34-66%) N/A Treatment Notes Electronic Signature(s) Signed: 06/13/2020 9:55:55 AM By: Linton Ham MD Entered By: Linton Ham on 06/08/2020 15:48:34 [name], [name] ([id number]) [name], [name]  ([id number]) -------------------------------------------------------------------------------- [place] Details Patient Name: [name] Date of Service: 06/08/2020 3:15 PM Medical Record Number: [id number] Patient Account Number: [id number] Date of Birth/Sex: [birth year] (49 y.o. F) Treating RN: Grover Canavan Primary Care Ladarrious Kirksey: Tomasa Hose Other Clinician: Referring Sinai Illingworth: Tomasa Hose Treating Kenyatte Gruber/Extender: Tito Dine in Treatment: 10 Active Inactive Orientation to the Wound Care Program Nursing Diagnoses: Knowledge deficit related to the wound healing center program Goals: Patient/caregiver will verbalize understanding of the [place] Program Date Initiated: 03/24/2020 Target Resolution Date: 04/22/2020 Goal Status: Active Interventions: Provide education on orientation to the wound center Notes: Wound/Skin Impairment Nursing Diagnoses: Impaired tissue integrity Goals: Ulcer/skin breakdown will have a volume reduction of 30% by week 4 Date Initiated: 03/24/2020 Target Resolution Date: 04/22/2020 Goal Status: Active Interventions: Assess ulceration(s) every visit Notes: Electronic Signature(s) Signed: 06/08/2020 4:32:30 PM By: Grover Canavan Entered By: Grover Canavan on 06/08/2020 15:24:06 [name] ([id number]) -------------------------------------------------------------------------------- Pain Assessment Details Patient Name: [name] Date of Service: 06/08/2020 3:15 PM Medical Record Number: [id number] Patient Account Number: [id number] Date of Birth/Sex: Apr 19, 1971 (49 y.o. F) Treating RN: Grover Canavan Primary Care Salim Forero: Tomasa Hose Other Clinician: Referring Myran Arcia: Tomasa Hose Treating Carmelite Violet/Extender: Tito Dine in Treatment: 10 Active Problems Location of Pain Severity and Description of Pain Patient Has Paino No Site Locations Pain Management and  Medication Current Pain Management: Electronic Signature(s) Signed: 06/08/2020 4:32:30 PM By: Grover Canavan Entered By: Grover Canavan on 06/08/2020 15:35:33 [name] ([id number]) -------------------------------------------------------------------------------- Patient/Caregiver Education Details Patient Name: [name] Date of Service: 06/08/2020 3:15 PM Medical Record Number: [id number] Patient Account Number: [id number] Date of Birth/Gender: 1970-10-15 (49 y.o. F) Treating RN: Grover Canavan Primary Care Physician: Tomasa Hose Other Clinician: Referring Physician: Tomasa Hose Treating Physician/Extender: Tito Dine in Treatment: 10 Education Assessment Education Provided To: Patient and Caregiver Education Topics Provided Wound/Skin Impairment: Methods: Explain/Verbal Responses: State content correctly Electronic Signature(s) Signed: 06/08/2020 4:32:30 PM By: Grover Canavan Entered By: Grover Canavan on 06/08/2020 15:24:37 [name] ([id number]) -------------------------------------------------------------------------------- Wound Assessment Details Patient Name: [name] Date of Service: 06/08/2020 3:15 PM Medical Record Number: [id number] Patient Account Number: [id number] Date of Birth/Sex: Dec 16, 1970 (49 y.o. F) Treating RN: Grover Canavan Primary Care Akyia Borelli: Tomasa Hose Other Clinician: Referring Conroy Goracke: Tomasa Hose Treating Shantese Raven/Extender: Ricard Dillon Weeks in Treatment: 10 Wound Status Wound Number: 2 Primary  Etiology: Open Surgical Wound Wound Location: Left, Lateral Foot Secondary Etiology: Diabetic Wound/Ulcer of the Lower Extremity Wounding Event: Surgical Injury Wound Status: Open Date Acquired: 04/09/2020 Comorbid History: Cataracts, Type II Diabetes, Neuropathy Weeks Of Treatment: 4 Clustered Wound: No Photos Wound Measurements Length: (cm) 3.2 Width: (cm) 14 Depth: (cm) 1.8 Area: (cm)  35.186 Volume: (cm) 63.335 % Reduction in Area: -28% % Reduction in Volume: -188% Epithelialization: None Wound Description Classification: Full Thickness With Exposed Support Structu Wound Margin: Epibole Exudate Amount: Medium Exudate Type: Serosanguineous Exudate Color: red, brown res Foul Odor After Cleansing: No Slough/Fibrino Yes Wound Bed Granulation Amount: Large (67-100%) Exposed Structure Granulation Quality: Red Fascia Exposed: No Necrotic Amount: Small (1-33%) Fat Layer (Subcutaneous Tissue) Exposed: Yes Necrotic Quality: Adherent Slough Tendon Exposed: No Muscle Exposed: No Joint Exposed: No Bone Exposed: No Treatment Notes Wound #2 (Left, Lateral Foot) Notes . Electronic Signature(s) Signed: 06/08/2020 4:22:24 PM By: Rhys Martini ([id number]) Signed: 06/08/2020 4:32:30 PM By: Grover Canavan Entered By: Darci Needle on 06/08/2020 15:25:15 [name], [name] ([id number]) -------------------------------------------------------------------------------- Wound Assessment Details Patient Name: [name] Date of Service: 06/08/2020 3:15 PM Medical Record Number: [id number] Patient Account Number: [id number] Date of Birth/Sex: February 25, 1971 (49 y.o. F) Treating RN: Grover Canavan Primary Care Kynadee Dam: Tomasa Hose Other Clinician: Referring Jancie Kercher: Tomasa Hose Treating Jestin Burbach/Extender: Tito Dine in Treatment: 10 Wound Status Wound Number: 3 Primary Pressure Ulcer Etiology: Wound Location: Left, Dorsal Foot Wound Open Wounding Event: Pressure Injury Status: Date Acquired: 06/01/2020 Notes: Patient presents with a new open pressure wound due to Weeks Of Treatment: 0 pressure from wound vac tubing. Patient states it was a Clustered Wound: No blister, now open area noted upon removal of wound vac. Comorbid Cataracts, Type II Diabetes, Neuropathy History: Photos Wound Measurements Length: (cm) 2.5 Width: (cm)  1.5 Depth: (cm) 0.1 Area: (cm) 2.945 Volume: (cm) 0.295 % Reduction in Area: % Reduction in Volume: Epithelialization: Medium (34-66%) Tunneling: No Undermining: No Wound Description Classification: Category/Stage II Wound Margin: Distinct, outline attached Exudate Amount: Medium Exudate Type: Serous Exudate Color: amber Foul Odor After Cleansing: No Slough/Fibrino No Wound Bed Granulation Amount: Medium (34-66%) Exposed Structure Granulation Quality: Pink Fascia Exposed: No Necrotic Amount: Small (1-33%) Fat Layer (Subcutaneous Tissue) Exposed: Yes Necrotic Quality: Adherent Slough Tendon Exposed: No Muscle Exposed: No Joint Exposed: No Bone Exposed: No Treatment Notes Wound #3 (Left, Dorsal Foot) Notes . [name], [name] ([id number]) Electronic Signature(s) Signed: 06/08/2020 4:22:24 PM By: Darci Needle Signed: 06/08/2020 4:32:30 PM By: Grover Canavan Entered By: Darci Needle on 06/08/2020 15:28:52 [name], [name] ([id number]) -------------------------------------------------------------------------------- [place] Details Patient Name: [name] Date of Service: 06/08/2020 3:15 PM Medical Record Number: [id number] Patient Account Number: [id number] Date of Birth/Sex: May 16, 1971 (49 y.o. F) Treating RN: Grover Canavan Primary Care Alayssa Flinchum: Tomasa Hose Other Clinician: Referring Kempton Milne: Tomasa Hose Treating Idaliz Tinkle/Extender: Tito Dine in Treatment: 10 Vital Signs Time Taken: 15:05 Temperature (F): 98.4 Height (in): 64 Pulse (bpm): 86 Weight (lbs): 142 Respiratory Rate (breaths/min): 16 Body Mass Index (BMI): 24.4 Blood Pressure (mmHg): 108/62 Reference Range: 80 - 120 mg / dl Electronic Signature(s) Signed: 06/08/2020 4:13:50 PM By: Lorine Bears RCP, RRT, CHT Entered By: Lorine Bears on 06/08/2020 15:08:32

## 2020-08-31 ENCOUNTER — Other Ambulatory Visit: Payer: Self-pay

## 2020-08-31 ENCOUNTER — Encounter: Payer: Self-pay | Admitting: Ophthalmology

## 2020-09-02 ENCOUNTER — Other Ambulatory Visit
Admission: RE | Admit: 2020-09-02 | Discharge: 2020-09-02 | Disposition: A | Discharge status: Home / Self Care | Payer: Medicaid Other | Source: Ambulatory Visit | Attending: Ophthalmology | Admitting: Ophthalmology

## 2020-09-02 ENCOUNTER — Other Ambulatory Visit: Payer: Self-pay

## 2020-09-02 DIAGNOSIS — Z01812 Encounter for preprocedural laboratory examination: Secondary | ICD-10-CM | POA: Insufficient documentation

## 2020-09-02 DIAGNOSIS — Z20822 Contact with and (suspected) exposure to covid-19: Secondary | ICD-10-CM | POA: Diagnosis not present

## 2020-09-02 LAB — SARS CORONAVIRUS 2 (TAT 6-24 HRS): SARS Coronavirus 2: NEGATIVE

## 2020-09-02 NOTE — Discharge Instructions (Signed)

## 2020-09-06 ENCOUNTER — Ambulatory Visit
Admission: RE | Admit: 2020-09-06 | Discharge: 2020-09-06 | Disposition: A | Discharge status: Home / Self Care | Payer: Medicaid Other | Attending: Ophthalmology | Admitting: Ophthalmology

## 2020-09-06 ENCOUNTER — Other Ambulatory Visit: Payer: Self-pay

## 2020-09-06 ENCOUNTER — Encounter: Payer: Self-pay | Admitting: Ophthalmology

## 2020-09-06 ENCOUNTER — Encounter
Admission: RE | Disposition: A | Discharge status: Home / Self Care | Payer: Self-pay | Source: Home / Self Care | Attending: Ophthalmology

## 2020-09-06 ENCOUNTER — Ambulatory Visit: Payer: Medicaid Other | Admitting: Anesthesiology

## 2020-09-06 DIAGNOSIS — E1140 Type 2 diabetes mellitus with diabetic neuropathy, unspecified: Secondary | ICD-10-CM | POA: Insufficient documentation

## 2020-09-06 DIAGNOSIS — H2511 Age-related nuclear cataract, right eye: Secondary | ICD-10-CM | POA: Insufficient documentation

## 2020-09-06 HISTORY — DX: Presence of dental prosthetic device (complete) (partial): Z97.2

## 2020-09-06 HISTORY — PX: CATARACT EXTRACTION W/PHACO: SHX586

## 2020-09-06 LAB — GLUCOSE, CAPILLARY
Glucose-Capillary: 85 mg/dL (ref 70–99)
Glucose-Capillary: 87 mg/dL (ref 70–99)

## 2020-09-06 SURGERY — PHACOEMULSIFICATION, CATARACT, WITH IOL INSERTION
Anesthesia: Monitor Anesthesia Care | Site: Eye | Laterality: Right

## 2020-09-06 MED ORDER — BRIMONIDINE TARTRATE-TIMOLOL 0.2-0.5 % OP SOLN
OPHTHALMIC | Status: DC | PRN
  Administered 2020-09-06: 1 [drp] via OPHTHALMIC

## 2020-09-06 MED ORDER — EPINEPHRINE PF 1 MG/ML IJ SOLN
INTRAOCULAR | Status: DC | PRN
  Administered 2020-09-06: 08:00:00 48 mL via OPHTHALMIC

## 2020-09-06 MED ORDER — FENTANYL CITRATE (PF) 100 MCG/2ML IJ SOLN
INTRAMUSCULAR | Status: DC | PRN
  Administered 2020-09-06: 50 ug via INTRAVENOUS

## 2020-09-06 MED ORDER — LIDOCAINE HCL (PF) 2 % IJ SOLN
INTRAOCULAR | Status: DC | PRN
  Administered 2020-09-06: 1 mL

## 2020-09-06 MED ORDER — MIDAZOLAM HCL 2 MG/2ML IJ SOLN
INTRAMUSCULAR | Status: DC | PRN
  Administered 2020-09-06: 2 mg via INTRAVENOUS

## 2020-09-06 MED ORDER — ARMC OPHTHALMIC DILATING DROPS
1.0000 "application " | OPHTHALMIC | Status: DC | PRN
  Administered 2020-09-06 (×3): 1 via OPHTHALMIC

## 2020-09-06 MED ORDER — NA CHONDROIT SULF-NA HYALURON 40-17 MG/ML IO SOLN
INTRAOCULAR | Status: DC | PRN
  Administered 2020-09-06: 1 mL via INTRAOCULAR

## 2020-09-06 MED ORDER — TETRACAINE HCL 0.5 % OP SOLN
1.0000 [drp] | OPHTHALMIC | Status: DC | PRN
  Administered 2020-09-06 (×3): 1 [drp] via OPHTHALMIC

## 2020-09-06 MED ORDER — MOXIFLOXACIN HCL 0.5 % OP SOLN
OPHTHALMIC | Status: DC | PRN
  Administered 2020-09-06: 0.2 mL via OPHTHALMIC

## 2020-09-06 SURGICAL SUPPLY — 20 items
CANNULA ANT/CHMB 27G (MISCELLANEOUS) ×2 IMPLANT
CANNULA ANT/CHMB 27GA (MISCELLANEOUS) ×6 IMPLANT
GLOVE SURG LX 8.0 MICRO (GLOVE) ×2
GLOVE SURG LX STRL 8.0 MICRO (GLOVE) ×1 IMPLANT
GLOVE SURG TRIUMPH 8.0 PF LTX (GLOVE) ×3 IMPLANT
GOWN STRL REUS W/ TWL LRG LVL3 (GOWN DISPOSABLE) ×2 IMPLANT
GOWN STRL REUS W/TWL LRG LVL3 (GOWN DISPOSABLE) ×6
LENS IOL DIOP 19.5 (Intraocular Lens) ×3 IMPLANT
LENS IOL TECNIS MONO 19.5 (Intraocular Lens) IMPLANT
MARKER SKIN DUAL TIP RULER LAB (MISCELLANEOUS) ×3 IMPLANT
NDL FILTER BLUNT 18X1 1/2 (NEEDLE) ×1 IMPLANT
NEEDLE FILTER BLUNT 18X 1/2SAF (NEEDLE) ×2
NEEDLE FILTER BLUNT 18X1 1/2 (NEEDLE) ×1 IMPLANT
PACK EYE AFTER SURG (MISCELLANEOUS) ×3 IMPLANT
PACK OPTHALMIC (MISCELLANEOUS) ×3 IMPLANT
PACK PORFILIO (MISCELLANEOUS) ×3 IMPLANT
SYR 3ML LL SCALE MARK (SYRINGE) ×3 IMPLANT
SYR TB 1ML LUER SLIP (SYRINGE) ×3 IMPLANT
WATER STERILE IRR 250ML POUR (IV SOLUTION) ×3 IMPLANT
WIPE NON LINTING 3.25X3.25 (MISCELLANEOUS) ×3 IMPLANT

## 2020-09-06 NOTE — Anesthesia Preprocedure Evaluation (Signed)
Anesthesia Evaluation  Patient identified by MRN, date of birth, ID band Patient awake    History of Anesthesia Complications Negative for: history of anesthetic complications  Airway Mallampati: II  TM Distance: >3 FB Neck ROM: Limited    Dental   Pulmonary Current Smoker,    breath sounds clear to auscultation       Cardiovascular  Rate:Normal     Neuro/Psych  Headaches, PSYCHIATRIC DISORDERS Anxiety Depression    GI/Hepatic GERD  ,  Endo/Other  diabetes  Renal/GU      Musculoskeletal   Abdominal   Peds  Hematology   Anesthesia Other Findings   Reproductive/Obstetrics                             Anesthesia Physical Anesthesia Plan  ASA: III  Anesthesia Plan: MAC   Post-op Pain Management:    Induction:   PONV Risk Score and Plan:   Airway Management Planned: Nasal Cannula  Additional Equipment:   Intra-op Plan:   Post-operative Plan:   Informed Consent: I have reviewed the patients History and Physical, chart, labs and discussed the procedure including the risks, benefits and alternatives for the proposed anesthesia with the patient or authorized representative who has indicated his/her understanding and acceptance.       Plan Discussed with:   Anesthesia Plan Comments:         Anesthesia Quick Evaluation

## 2020-09-06 NOTE — Anesthesia Postprocedure Evaluation (Signed)
Anesthesia Post Note  Patient: [name]  Procedure(s) Performed: CATARACT EXTRACTION PHACO AND INTRAOCULAR LENS PLACEMENT (IOC) RIGHT DIABETIC 6.63 00:53.3 (Right Eye)     Anesthesia Post Evaluation No complications documented.  Wanda Plump Tateanna Bach

## 2020-09-06 NOTE — H&P (Signed)
[facility]   Primary Care Physician:  Donnie Coffin, MD Ophthalmologist: Dr. George Ina  Pre-Procedure History & Physical: HPI:  [name] is a 49 y.o. female here for cataract surgery.   Past Medical History:  Diagnosis Date  . allergic rhinitis   . Anemia    H/O  . Anxiety   . Depression   . Diabetes mellitus   . Diabetic neuropathy (HCC)    Feet, legs, hands, arms  . GERD (gastroesophageal reflux disease)   . Headache   . History of migraine headaches   . Neuropathy   . UTI (urinary tract infection) 06-2015  . Wears dentures    full upper and lower    Past Surgical History:  Procedure Laterality Date  . 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  [facility]  . [place]   x 2,  breech, premature 7 wks  . CHOLECYSTECTOMY N/A 07/22/2015   Procedure: LAPAROSCOPIC CHOLECYSTECTOMY ;  Surgeon: Florene Glen, MD;  Location: [facility] ORS;  Service: General;  Laterality: N/A;  . INCISION AND DRAINAGE ABSCESS Left 05/25/2016   Procedure: INCISION AND DRAINAGE ABSCESS;  Surgeon: Clayburn Pert, MD;  Location: [facility] ORS;  Service: General;  Laterality: Left;  . TONSILLECTOMY     as child  . TUBAL LIGATION  1998    Prior to Admission medications   Medication Sig Start Date End Date Taking? Authorizing Provider  aspirin 81 MG EC tablet Take 81 mg by mouth daily.   Yes [provider]  atorvastatin (LIPITOR) 10 MG tablet Take 10 mg by mouth daily.   Yes [provider]  clopidogrel (PLAVIX) 75 MG tablet Take 75 mg by mouth daily.   Yes [provider]  DULoxetine (CYMBALTA) 30 MG capsule Take 30 mg by mouth every evening.   Yes [provider]  DULoxetine (CYMBALTA) 60 MG capsule Take 60 mg by mouth daily.    Yes [provider]  furosemide (LASIX) 20 MG tablet Take 20 mg by mouth 2 (two) times daily.   Yes [provider]  hydrOXYzine (ATARAX/VISTARIL) 25 MG tablet Take 25 mg by mouth.   Yes [provider]  insulin glargine (LANTUS) 100 UNIT/ML injection Inject 25 Units into the skin at bedtime.   Yes [provider]  insulin lispro (HUMALOG) 100 UNIT/ML injection Inject into the skin 3 (three) times daily before meals.   Yes [provider]  lisinopril (PRINIVIL,ZESTRIL) 2.5 MG tablet Take 2.5 mg by mouth daily.   Yes [provider]  magnesium oxide (MAG-OX) 400 MG tablet Take 800 mg by mouth daily.   Yes [provider]  melatonin 3 MG TABS tablet Take 3 mg by mouth at bedtime.   Yes [provider]  metformin (FORTAMET) 500 MG (OSM) 24 hr tablet Take 1,000 mg by mouth 2 (two) times daily.    Yes [provider]  metoCLOPramide (REGLAN) 10 MG tablet Take 10 mg by mouth 3 (three) times daily before meals.   Yes [provider]  omeprazole (PRILOSEC) 40 MG capsule Take 1 capsule (40 mg total) by mouth daily. 04/18/17 03/01/20 Yes Nena Polio, MD  pregabalin (LYRICA) 50 MG capsule Take 50 mg by mouth 3 (three) times daily.   Yes [provider]  sitaGLIPtin (JANUVIA) 100 MG tablet Take 100 mg by mouth daily.   Yes [provider]  traZODone (DESYREL) 50 MG tablet Take 50-100 mg by mouth  at bedtime.   Yes [provider]  glucose blood test strip Use as instructed up to 4 times daily 04/14/13   Crecencio Mc, MD  INS SYRINGE/NEEDLE 1CC/28G (B-D INSULIN SYRINGE 1CC/28G) 28G X 1/2" 1 ML MISC 1 Syringe by Does not apply route daily after supper. 03/17/15   Crecencio Mc, MD  Lancets MISC Patient test blood sugar two times daily. 08/24/11   Crecencio Mc, MD    Allergies as of 07/07/2020  . (No Known Allergies)    Family History  Problem Relation Age of Onset  . Hypertension Mother   . Hypothyroidism Mother   . Thrombocytopenia Mother   . Heart disease Father   . Hyperlipidemia Father   . Stroke Father   . Diabetes Father   . Hypertension Father   . Breast cancer Neg Hx     Social  History   Socioeconomic History  . Marital status: Divorced    Spouse name: Not on file  . Number of children: Not on file  . Years of education: Not on file  . Highest education level: Not on file  Occupational History  . Not on file  Tobacco Use  . Smoking status: Current Every Day Smoker    Packs/day: 0.25    Years: 35.00    Pack years: 8.75    Types: Cigarettes  . Smokeless tobacco: Never Used  . Tobacco comment: Started smoking age 58  Vaping Use  . Vaping Use: Never used  Substance and Sexual Activity  . Alcohol use: No  . Drug use: No  . Sexual activity: Not on file  Other Topics Concern  . Not on file  Social History Narrative  . Not on file   Social Determinants of Health   Financial Resource Strain: Not on file  Food Insecurity: Not on file  Transportation Needs: Not on file  Physical Activity: Not on file  Stress: Not on file  Social Connections: Not on file  Intimate Partner Violence: Not on file    Review of Systems: See HPI, otherwise negative ROS  Physical Exam: BP 135/79   Pulse 80   Temp (!) 97.5 F (36.4 C) (Temporal)   Resp 16   Ht 5\' 4"  (1.626 m)   Wt 79.8 kg   LMP 01/29/2018 (Approximate)   SpO2 98%   BMI 30.21 kg/m  General:   Alert,  pleasant and cooperative in NAD Head:  Normocephalic and atraumatic. Respiratory:  Normal work of breathing. Heart:  Regular rate and rhythm.  Impression/Plan: [name] is here for cataract surgery.  Risks, benefits, limitations, and alternatives regarding cataract surgery have been reviewed with the patient.  Questions have been answered.  All parties agreeable.   Birder Robson, MD  09/06/2020, 7:23 AM

## 2020-09-06 NOTE — Anesthesia Procedure Notes (Signed)
Procedure Name: MAC Performed by: Trice Aspinall, CRNA Pre-anesthesia Checklist: Patient identified, Emergency Drugs available, Suction available, Timeout performed and Patient being monitored Patient Re-evaluated:Patient Re-evaluated prior to induction Oxygen Delivery Method: Nasal cannula Placement Confirmation: positive ETCO2       

## 2020-09-06 NOTE — Transfer of Care (Signed)
Immediate Anesthesia Transfer of Care Note  Patient: [name]  Procedure(s) Performed: CATARACT EXTRACTION PHACO AND INTRAOCULAR LENS PLACEMENT (IOC) RIGHT DIABETIC 6.63 00:53.3 (Right Eye)  Patient Location: PACU  Anesthesia Type: MAC  Level of Consciousness: awake, alert  and patient cooperative  Airway and Oxygen Therapy: Patient Spontanous Breathing and Patient connected to supplemental oxygen  Post-op Assessment: Post-op Vital signs reviewed, Patient's Cardiovascular Status Stable, Respiratory Function Stable, Patent Airway and No signs of Nausea or vomiting  Post-op Vital Signs: Reviewed and stable  Complications: No complications documented.

## 2020-09-06 NOTE — Op Note (Signed)
PREOPERATIVE DIAGNOSIS:  Nuclear sclerotic cataract of the right eye.   POSTOPERATIVE DIAGNOSIS:  H25.041 Cataract   OPERATIVE PROCEDURE:@   SURGEON:  Birder Robson, MD.   ANESTHESIA:  Anesthesiologist: Carlos American, MD CRNA: Mayme Genta, CRNA  1.      Managed anesthesia care. 2.      0.65ml of Shugarcaine was instilled in the eye following the paracentesis.   COMPLICATIONS:  None.   TECHNIQUE:   Stop and chop   DESCRIPTION OF PROCEDURE:  The patient was examined and consented in the preoperative holding area where the aforementioned topical anesthesia was applied to the right eye and then brought back to the Operating Room where the right eye was prepped and draped in the usual sterile ophthalmic fashion and a lid speculum was placed. A paracentesis was created with the side port blade and the anterior chamber was filled with viscoelastic. A near clear corneal incision was performed with the steel keratome. A continuous curvilinear capsulorrhexis was performed with a cystotome followed by the capsulorrhexis forceps. Hydrodissection and hydrodelineation were carried out with BSS on a blunt cannula. The lens was removed in a stop and chop  technique and the remaining cortical material was removed with the irrigation-aspiration handpiece. The capsular bag was inflated with viscoelastic and the Technis ZCB00  lens was placed in the capsular bag without complication. The remaining viscoelastic was removed from the eye with the irrigation-aspiration handpiece. The wounds were hydrated. The anterior chamber was flushed with BSS and the eye was inflated to physiologic pressure. 0.71ml of Vigamox was placed in the anterior chamber. The wounds were found to be water tight. The eye was dressed with Combigan. The patient was given protective glasses to wear throughout the day and a shield with which to sleep tonight. The patient was also given drops with which to begin a drop regimen today and  will follow-up with me in one day. Implant Name Type Inv. Item Serial No. Manufacturer Lot No. LRB No. Used Action  LENS IOL DIOP 19.5 - [id number] Intraocular Lens LENS IOL DIOP 19.5 [id number] JOHNSON   Right 1 Implanted   Procedure(s) with comments: CATARACT EXTRACTION PHACO AND INTRAOCULAR LENS PLACEMENT (IOC) RIGHT DIABETIC 6.63 00:53.3 (Right) - Diabetic - insulin and oral meds  Electronically signed: Birder Robson 09/06/2020 7:50 AM

## 2020-09-07 ENCOUNTER — Encounter: Payer: Self-pay | Admitting: Ophthalmology

## 2020-10-04 ENCOUNTER — Other Ambulatory Visit: Payer: Self-pay

## 2020-10-04 ENCOUNTER — Encounter: Payer: Self-pay | Admitting: Ophthalmology

## 2020-10-06 ENCOUNTER — Other Ambulatory Visit: Payer: Self-pay

## 2020-10-06 ENCOUNTER — Other Ambulatory Visit
Admission: RE | Admit: 2020-10-06 | Discharge: 2020-10-06 | Disposition: A | Discharge status: Home / Self Care | Payer: Medicare Other | Source: Ambulatory Visit | Attending: Ophthalmology | Admitting: Ophthalmology

## 2020-10-06 DIAGNOSIS — Z01812 Encounter for preprocedural laboratory examination: Secondary | ICD-10-CM | POA: Diagnosis present

## 2020-10-06 DIAGNOSIS — Z20822 Contact with and (suspected) exposure to covid-19: Secondary | ICD-10-CM | POA: Insufficient documentation

## 2020-10-06 LAB — SARS CORONAVIRUS 2 (TAT 6-24 HRS): SARS Coronavirus 2: NEGATIVE

## 2020-10-07 ENCOUNTER — Other Ambulatory Visit: Payer: Medicaid Other

## 2020-10-07 NOTE — Discharge Instructions (Signed)

## 2020-10-11 ENCOUNTER — Ambulatory Visit: Payer: Medicare Other | Admitting: Anesthesiology

## 2020-10-11 ENCOUNTER — Encounter
Admission: RE | Disposition: A | Discharge status: Home / Self Care | Payer: Self-pay | Source: Home / Self Care | Attending: Ophthalmology

## 2020-10-11 ENCOUNTER — Other Ambulatory Visit: Payer: Self-pay

## 2020-10-11 ENCOUNTER — Ambulatory Visit
Admission: RE | Admit: 2020-10-11 | Discharge: 2020-10-11 | Disposition: A | Discharge status: Home / Self Care | Payer: Medicare Other | Attending: Ophthalmology | Admitting: Ophthalmology

## 2020-10-11 DIAGNOSIS — Z7984 Long term (current) use of oral hypoglycemic drugs: Secondary | ICD-10-CM | POA: Insufficient documentation

## 2020-10-11 DIAGNOSIS — H2512 Age-related nuclear cataract, left eye: Secondary | ICD-10-CM | POA: Insufficient documentation

## 2020-10-11 DIAGNOSIS — Z7982 Long term (current) use of aspirin: Secondary | ICD-10-CM | POA: Insufficient documentation

## 2020-10-11 DIAGNOSIS — Z794 Long term (current) use of insulin: Secondary | ICD-10-CM | POA: Diagnosis not present

## 2020-10-11 DIAGNOSIS — E1136 Type 2 diabetes mellitus with diabetic cataract: Secondary | ICD-10-CM | POA: Insufficient documentation

## 2020-10-11 DIAGNOSIS — F17210 Nicotine dependence, cigarettes, uncomplicated: Secondary | ICD-10-CM | POA: Diagnosis not present

## 2020-10-11 HISTORY — PX: CATARACT EXTRACTION W/PHACO: SHX586

## 2020-10-11 LAB — GLUCOSE, CAPILLARY
Glucose-Capillary: 84 mg/dL (ref 70–99)
Glucose-Capillary: 97 mg/dL (ref 70–99)

## 2020-10-11 SURGERY — PHACOEMULSIFICATION, CATARACT, WITH IOL INSERTION
Anesthesia: Monitor Anesthesia Care | Site: Eye | Laterality: Left

## 2020-10-11 MED ORDER — TETRACAINE HCL 0.5 % OP SOLN
1.0000 [drp] | OPHTHALMIC | Status: DC | PRN
  Administered 2020-10-11 (×3): 1 [drp] via OPHTHALMIC

## 2020-10-11 MED ORDER — FENTANYL CITRATE (PF) 100 MCG/2ML IJ SOLN
INTRAMUSCULAR | Status: DC | PRN
  Administered 2020-10-11: 50 ug via INTRAVENOUS

## 2020-10-11 MED ORDER — ARMC OPHTHALMIC DILATING DROPS
1.0000 "application " | OPHTHALMIC | Status: DC | PRN
  Administered 2020-10-11 (×3): 1 via OPHTHALMIC

## 2020-10-11 MED ORDER — EPINEPHRINE PF 1 MG/ML IJ SOLN
INTRAOCULAR | Status: DC | PRN
  Administered 2020-10-11: 56 mL via OPHTHALMIC

## 2020-10-11 MED ORDER — NA CHONDROIT SULF-NA HYALURON 40-17 MG/ML IO SOLN
INTRAOCULAR | Status: DC | PRN
  Administered 2020-10-11: 1 mL via INTRAOCULAR

## 2020-10-11 MED ORDER — MIDAZOLAM HCL 2 MG/2ML IJ SOLN
INTRAMUSCULAR | Status: DC | PRN
  Administered 2020-10-11 (×2): 1 mg via INTRAVENOUS

## 2020-10-11 MED ORDER — LIDOCAINE HCL (PF) 2 % IJ SOLN
INTRAOCULAR | Status: DC | PRN
  Administered 2020-10-11: 1 mL

## 2020-10-11 MED ORDER — BRIMONIDINE TARTRATE-TIMOLOL 0.2-0.5 % OP SOLN
OPHTHALMIC | Status: DC | PRN
  Administered 2020-10-11: 1 [drp] via OPHTHALMIC

## 2020-10-11 MED ORDER — MOXIFLOXACIN HCL 0.5 % OP SOLN
OPHTHALMIC | Status: DC | PRN
  Administered 2020-10-11: 0.2 mL via OPHTHALMIC

## 2020-10-11 SURGICAL SUPPLY — 22 items
CANNULA ANT/CHMB 27G (MISCELLANEOUS) ×2 IMPLANT
CANNULA ANT/CHMB 27GA (MISCELLANEOUS) ×4 IMPLANT
GLOVE SURG LX 8.0 MICRO (GLOVE) ×1
GLOVE SURG LX STRL 8.0 MICRO (GLOVE) ×1 IMPLANT
GLOVE SURG TRIUMPH 8.0 PF LTX (GLOVE) ×3 IMPLANT
GOWN STRL REUS W/ TWL LRG LVL3 (GOWN DISPOSABLE) ×2 IMPLANT
GOWN STRL REUS W/TWL LRG LVL3 (GOWN DISPOSABLE) ×4
LENS IOL DIOP 19.0 (Intraocular Lens) ×2 IMPLANT
LENS IOL TECNIS MONO 19.0 (Intraocular Lens) IMPLANT
MARKER SKIN DUAL TIP RULER LAB (MISCELLANEOUS) ×2 IMPLANT
NDL FILTER BLUNT 18X1 1/2 (NEEDLE) ×1 IMPLANT
NEEDLE FILTER BLUNT 18X 1/2SAF (NEEDLE) ×1
NEEDLE FILTER BLUNT 18X1 1/2 (NEEDLE) ×1 IMPLANT
PACK EYE AFTER SURG (MISCELLANEOUS) ×2 IMPLANT
PACK OPTHALMIC (MISCELLANEOUS) ×2 IMPLANT
PACK PORFILIO (MISCELLANEOUS) ×2 IMPLANT
SUT ETHILON 10-0 CS-B-6CS-B-6 (SUTURE)
SUTURE EHLN 10-0 CS-B-6CS-B-6 (SUTURE) IMPLANT
SYR 3ML LL SCALE MARK (SYRINGE) ×2 IMPLANT
SYR TB 1ML LUER SLIP (SYRINGE) ×2 IMPLANT
WATER STERILE IRR 250ML POUR (IV SOLUTION) ×2 IMPLANT
WIPE NON LINTING 3.25X3.25 (MISCELLANEOUS) ×2 IMPLANT

## 2020-10-11 NOTE — Op Note (Signed)
PREOPERATIVE DIAGNOSIS:  Nuclear sclerotic cataract of the left eye.   POSTOPERATIVE DIAGNOSIS:  Nuclear sclerotic cataract of the left eye.   OPERATIVE PROCEDURE:@   SURGEON:  Birder Robson, MD.   ANESTHESIA:  Anesthesiologist: Veda Canning, MD CRNA: Cameron Ali, CRNA  1.      Managed anesthesia care. 2.     0.37ml of Shugarcaine was instilled following the paracentesis   COMPLICATIONS:  None.   TECHNIQUE:   Stop and chop   DESCRIPTION OF PROCEDURE:  The patient was examined and consented in the preoperative holding area where the aforementioned topical anesthesia was applied to the left eye and then brought back to the Operating Room where the left eye was prepped and draped in the usual sterile ophthalmic fashion and a lid speculum was placed. A paracentesis was created with the side port blade and the anterior chamber was filled with viscoelastic. A near clear corneal incision was performed with the steel keratome. A continuous curvilinear capsulorrhexis was performed with a cystotome followed by the capsulorrhexis forceps. Hydrodissection and hydrodelineation were carried out with BSS on a blunt cannula. The lens was removed in a stop and chop  technique and the remaining cortical material was removed with the irrigation-aspiration handpiece. The capsular bag was inflated with viscoelastic and the Technis ZCB00 lens was placed in the capsular bag without complication. The remaining viscoelastic was removed from the eye with the irrigation-aspiration handpiece. The wounds were hydrated. The anterior chamber was flushed with BSS and the eye was inflated to physiologic pressure. 0.42ml Vigamox was placed in the anterior chamber. The wounds were found to be water tight. The eye was dressed with Combigan. The patient was given protective glasses to wear throughout the day and a shield with which to sleep tonight. The patient was also given drops with which to begin a drop regimen today and  will follow-up with me in one day. Implant Name Type Inv. Item Serial No. Manufacturer Lot No. LRB No. Used Action  LENS IOL DIOP 19.0 - [id number] Intraocular Lens LENS IOL DIOP 19.0 [id number] JOHNSON   Left 1 Implanted    Procedure(s) with comments: CATARACT EXTRACTION PHACO AND INTRAOCULAR LENS PLACEMENT (IOC) LEFT DIABETIC (Left) - 7.07 0:50.3  Electronically signed: Birder Robson 10/11/2020 8:17 AM

## 2020-10-11 NOTE — Anesthesia Procedure Notes (Signed)
Procedure Name: MAC Date/Time: 10/11/2020 7:59 AM Performed by: Cameron Ali, CRNA Pre-anesthesia Checklist: Patient identified, Emergency Drugs available, Suction available, Timeout performed and Patient being monitored Patient Re-evaluated:Patient Re-evaluated prior to induction Oxygen Delivery Method: Nasal cannula Placement Confirmation: positive ETCO2

## 2020-10-11 NOTE — Transfer of Care (Signed)
Immediate Anesthesia Transfer of Care Note  Patient: [name]  Procedure(s) Performed: CATARACT EXTRACTION PHACO AND INTRAOCULAR LENS PLACEMENT (IOC) LEFT DIABETIC (Left Eye)  Patient Location: PACU  Anesthesia Type: MAC  Level of Consciousness: awake, alert  and patient cooperative  Airway and Oxygen Therapy: Patient Spontanous Breathing and Patient connected to supplemental oxygen  Post-op Assessment: Post-op Vital signs reviewed, Patient's Cardiovascular Status Stable, Respiratory Function Stable, Patent Airway and No signs of Nausea or vomiting  Post-op Vital Signs: Reviewed and stable  Complications: No complications documented.

## 2020-10-11 NOTE — Anesthesia Postprocedure Evaluation (Signed)
Anesthesia Post Note  Patient: [name]  Procedure(s) Performed: CATARACT EXTRACTION PHACO AND INTRAOCULAR LENS PLACEMENT (IOC) LEFT DIABETIC (Left Eye)     Patient location during evaluation: PACU Anesthesia Type: MAC Level of consciousness: awake Pain management: pain level controlled Vital Signs Assessment: post-procedure vital signs reviewed and stable Respiratory status: respiratory function stable Cardiovascular status: stable Postop Assessment: no apparent nausea or vomiting Anesthetic complications: no   No complications documented.  Veda Canning

## 2020-10-11 NOTE — H&P (Signed)
[facility]   Primary Care Physician:  Donnie Coffin, MD Ophthalmologist: Dr. George Ina  Pre-Procedure History & Physical: HPI:  [name] is a 50 y.o. female here for cataract surgery.   Past Medical History:  Diagnosis Date  . allergic rhinitis   . Anemia    H/O  . Anxiety   . Depression   . Diabetes mellitus   . Diabetic neuropathy (HCC)    Feet, legs, hands, arms  . GERD (gastroesophageal reflux disease)   . Headache   . History of migraine headaches   . Neuropathy   . UTI (urinary tract infection) 06-2015  . Wears dentures    full upper and lower    Past Surgical History:  Procedure Laterality Date  . 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  [facility]  . CATARACT EXTRACTION W/PHACO Right 09/06/2020   Procedure: CATARACT EXTRACTION PHACO AND INTRAOCULAR LENS PLACEMENT (IOC) RIGHT DIABETIC 6.63 00:53.3;  Surgeon: Birder Robson, MD;  Location: [place];  Service: Ophthalmology;  Laterality: Right;  Diabetic - insulin and oral meds  . [place]   x 2,  breech, premature 7 wks  . CHOLECYSTECTOMY N/A 07/22/2015   Procedure: LAPAROSCOPIC CHOLECYSTECTOMY ;  Surgeon: Florene Glen, MD;  Location: [facility];  Service: General;  Laterality: N/A;  . INCISION AND DRAINAGE ABSCESS Left 05/25/2016   Procedure: INCISION AND DRAINAGE ABSCESS;  Surgeon: Clayburn Pert, MD;  Location: [facility];  Service: General;  Laterality: Left;  . TONSILLECTOMY     as child  . TUBAL LIGATION  1998    Prior to Admission medications   Medication Sig Start Date End Date Taking? Authorizing Provider  aspirin 81 MG EC tablet Take 81 mg by mouth daily.   Yes [provider]  atorvastatin (LIPITOR) 10 MG tablet Take 10 mg by mouth daily.   Yes [provider]  clopidogrel (PLAVIX) 75 MG tablet Take 75 mg by mouth daily.   Yes [provider]  DULoxetine (CYMBALTA) 30 MG capsule Take 30 mg by mouth every evening.   Yes [provider]  DULoxetine (CYMBALTA) 60 MG capsule Take 60 mg by mouth daily.    Yes [provider]  furosemide (LASIX) 20 MG tablet Take 20 mg by mouth 2 (two) times daily.   Yes [provider]  glucose blood test strip Use as instructed up to 4 times daily 04/14/13  Yes Crecencio Mc, MD  hydrOXYzine (ATARAX/VISTARIL) 25 MG tablet Take 25 mg by mouth.   Yes [provider]  INS SYRINGE/NEEDLE 1CC/28G (B-D INSULIN SYRINGE 1CC/28G) 28G X 1/2" 1 ML MISC 1 Syringe by Does not apply route daily after supper. 03/17/15  Yes Crecencio Mc, MD  insulin glargine (LANTUS) 100 UNIT/ML injection Inject 25 Units into the skin at bedtime.   Yes [provider]  insulin lispro (HUMALOG) 100 UNIT/ML injection Inject into the skin 3 (three) times daily before meals.   Yes [provider]  lisinopril (PRINIVIL,ZESTRIL) 2.5 MG tablet Take 2.5 mg by mouth daily.   Yes [provider]  magnesium oxide (MAG-OX) 400 MG tablet Take 800 mg by mouth daily.   Yes [provider]  melatonin 3 MG TABS tablet Take 3 mg by mouth at bedtime.   Yes [provider]  metformin (FORTAMET) 500 MG (OSM) 24 hr tablet Take 1,000 mg by mouth 2 (two) times daily.    Yes [provider]  metoCLOPramide (REGLAN) 10 MG tablet Take 10 mg by mouth 3 (three) times daily before meals.   Yes [provider]  omeprazole (PRILOSEC) 40 MG capsule Take 1 capsule (40 mg total) by mouth daily. 04/18/17 03/01/20 Yes Nena Polio, MD  pregabalin (LYRICA) 50 MG capsule Take 50 mg by mouth 3 (three) times daily.   Yes [provider]  sitaGLIPtin (JANUVIA) 100 MG tablet Take 100 mg by mouth daily.   Yes [provider]  traZODone (DESYREL) 50 MG tablet Take 50-100 mg by mouth at bedtime.   Yes [provider]  Lancets MISC Patient test blood sugar two times daily. 08/24/11   Crecencio Mc, MD    Allergies as of 09/08/2020  . (No  Known Allergies)    Family History  Problem Relation Age of Onset  . Hypertension Mother   . Hypothyroidism Mother   . Thrombocytopenia Mother   . Heart disease Father   . Hyperlipidemia Father   . Stroke Father   . Diabetes Father   . Hypertension Father   . Breast cancer Neg Hx     Social History   Socioeconomic History  . Marital status: Divorced    Spouse name: Not on file  . Number of children: Not on file  . Years of education: Not on file  . Highest education level: Not on file  Occupational History  . Not on file  Tobacco Use  . Smoking status: Current Every Day Smoker    Packs/day: 0.25    Years: 35.00    Pack years: 8.75    Types: Cigarettes  . Smokeless tobacco: Never Used  . Tobacco comment: Started smoking age 35  Vaping Use  . Vaping Use: Never used  Substance and Sexual Activity  . Alcohol use: No  . Drug use: No  . Sexual activity: Not on file  Other Topics Concern  . Not on file  Social History Narrative  . Not on file   Social Determinants of Health   Financial Resource Strain: Not on file  Food Insecurity: Not on file  Transportation Needs: Not on file  Physical Activity: Not on file  Stress: Not on file  Social Connections: Not on file  Intimate Partner Violence: Not on file    Review of Systems: See HPI, otherwise negative ROS  Physical Exam: BP 134/80   Pulse 81   Temp (!) 96.4 F (35.8 C) (Temporal)   Ht 5\' 4"  (1.626 m)   Wt 81.2 kg   LMP 01/29/2018 (Approximate)   SpO2 96%   BMI 30.73 kg/m  General:   Alert,  pleasant and cooperative in NAD Head:  Normocephalic and atraumatic. Respiratory:  Normal work of breathing.  Impression/Plan: [name] is here for cataract surgery.  Risks, benefits, limitations, and alternatives regarding cataract surgery have been reviewed with the patient.  Questions have been answered.  All parties agreeable.   Birder Robson, MD  10/11/2020, 7:48 AM

## 2020-10-11 NOTE — Anesthesia Preprocedure Evaluation (Signed)
Anesthesia Evaluation  Patient identified by MRN, date of birth, ID band Patient awake    History of Anesthesia Complications Negative for: history of anesthetic complications  Airway Mallampati: II  TM Distance: >3 FB Neck ROM: Limited    Dental   Pulmonary Current Smoker and Patient abstained from smoking.,    breath sounds clear to auscultation       Cardiovascular  Rhythm:Regular Rate:Normal     Neuro/Psych  Headaches, PSYCHIATRIC DISORDERS Anxiety Depression    GI/Hepatic GERD  ,  Endo/Other  diabetes  Renal/GU      Musculoskeletal   Abdominal   Peds  Hematology   Anesthesia Other Findings   Reproductive/Obstetrics                             Anesthesia Physical  Anesthesia Plan  ASA: III  Anesthesia Plan: MAC   Post-op Pain Management:    Induction:   PONV Risk Score and Plan:   Airway Management Planned: Nasal Cannula  Additional Equipment:   Intra-op Plan:   Post-operative Plan:   Informed Consent: I have reviewed the patients History and Physical, chart, labs and discussed the procedure including the risks, benefits and alternatives for the proposed anesthesia with the patient or authorized representative who has indicated his/her understanding and acceptance.       Plan Discussed with:   Anesthesia Plan Comments:         Anesthesia Quick Evaluation

## 2020-10-12 ENCOUNTER — Encounter: Payer: Self-pay | Admitting: Ophthalmology

## 2021-04-11 ENCOUNTER — Other Ambulatory Visit: Payer: Self-pay | Admitting: Family Medicine

## 2021-04-11 DIAGNOSIS — Z1231 Encounter for screening mammogram for malignant neoplasm of breast: Secondary | ICD-10-CM

## 2022-12-02 ENCOUNTER — Emergency Department: Payer: Medicare Other

## 2022-12-02 ENCOUNTER — Other Ambulatory Visit: Payer: Self-pay

## 2022-12-02 DIAGNOSIS — H1131 Conjunctival hemorrhage, right eye: Secondary | ICD-10-CM | POA: Insufficient documentation

## 2022-12-02 DIAGNOSIS — R4701 Aphasia: Secondary | ICD-10-CM | POA: Diagnosis not present

## 2022-12-02 DIAGNOSIS — H5710 Ocular pain, unspecified eye: Secondary | ICD-10-CM | POA: Diagnosis present

## 2022-12-02 DIAGNOSIS — E1165 Type 2 diabetes mellitus with hyperglycemia: Secondary | ICD-10-CM | POA: Diagnosis not present

## 2022-12-02 LAB — COMPREHENSIVE METABOLIC PANEL
ALT: 9 U/L (ref 0–44)
AST: 18 U/L (ref 15–41)
Albumin: 4.1 g/dL (ref 3.5–5.0)
Alkaline Phosphatase: 68 U/L (ref 38–126)
Anion gap: 13 (ref 5–15)
BUN: 20 mg/dL (ref 6–20)
CO2: 22 mmol/L (ref 22–32)
Calcium: 9.4 mg/dL (ref 8.9–10.3)
Chloride: 97 mmol/L — ABNORMAL LOW (ref 98–111)
Creatinine, Ser: 0.81 mg/dL (ref 0.44–1.00)
GFR, Estimated: >60 mL/min (ref >60)
Glucose, Bld: 207 mg/dL — ABNORMAL HIGH (ref 70–99)
Potassium: 3.9 mmol/L (ref 3.5–5.1)
Sodium: 132 mmol/L — ABNORMAL LOW (ref 135–145)
Total Bilirubin: 1.2 mg/dL (ref 0.3–1.2)
Total Protein: 8.7 g/dL — ABNORMAL HIGH (ref 6.5–8.1)

## 2022-12-02 LAB — CBC WITH DIFFERENTIAL/PLATELET
Abs Immature Granulocytes: 0.04 10*3/uL (ref 0.00–0.07)
Basophils Absolute: 0.1 10*3/uL (ref 0.0–0.1)
Basophils Relative: 1 %
Eosinophils Absolute: 0 10*3/uL (ref 0.0–0.5)
Eosinophils Relative: 0 %
HCT: 39.9 % (ref 36.0–46.0)
Hemoglobin: 12.7 g/dL (ref 12.0–15.0)
Immature Granulocytes: 1 %
Lymphocytes Relative: 20 %
Lymphs Abs: 1.6 10*3/uL (ref 0.7–4.0)
MCH: 26.7 pg (ref 26.0–34.0)
MCHC: 31.8 g/dL (ref 30.0–36.0)
MCV: 84 fL (ref 80.0–100.0)
Monocytes Absolute: 0.6 10*3/uL (ref 0.1–1.0)
Monocytes Relative: 7 %
Neutro Abs: 5.8 10*3/uL (ref 1.7–7.7)
Neutrophils Relative %: 71 %
Platelets: 248 10*3/uL (ref 150–400)
RBC: 4.75 MIL/uL (ref 3.87–5.11)
RDW: 14.6 % (ref 11.5–15.5)
WBC: 8.1 10*3/uL (ref 4.0–10.5)
nRBC: 0 % (ref 0.0–0.2)

## 2022-12-02 LAB — CBG MONITORING, ED: Glucose-Capillary: 204 mg/dL — ABNORMAL HIGH (ref 70–99)

## 2022-12-02 NOTE — ED Triage Notes (Addendum)
Pt family reports pt has had slurred speech since Saturday 4 pm. Family reports they still feel pt speech is slurred. No slurred speech notable to this RN. Pt reports h/a, dizziness, and emesis yesterday that is now resolved. Expressive aphasia noted. Pt does report tinging and diminished sensation to R leg. No droop noted. No deficits r/t grip or strength. Pt alert. Oriented to place and self and situation. Disoriented to time.   BGL 204  Pt also reports eye pain. Sclera to R eye noted to be red. Reports gets injections in eyes and received those on Friday. Reports she jumped during injection and sclera has been red since and painful. Denies changes to vision such as blurriness or double.

## 2022-12-03 ENCOUNTER — Emergency Department
Admission: EM | Admit: 2022-12-03 | Discharge: 2022-12-03 | Disposition: A | Discharge status: Home / Self Care | Payer: Medicare Other | Attending: Emergency Medicine | Admitting: Emergency Medicine

## 2022-12-03 ENCOUNTER — Encounter: Payer: Self-pay | Admitting: Radiology

## 2022-12-03 ENCOUNTER — Emergency Department: Payer: Medicare Other

## 2022-12-03 DIAGNOSIS — H1131 Conjunctival hemorrhage, right eye: Secondary | ICD-10-CM

## 2022-12-03 DIAGNOSIS — R4789 Other speech disturbances: Secondary | ICD-10-CM

## 2022-12-03 LAB — CBG MONITORING, ED: Glucose-Capillary: 216 mg/dL — ABNORMAL HIGH (ref 70–99)

## 2022-12-03 NOTE — ED Notes (Addendum)
Pts son to STAT desk stating he thinks his mothers sugar is dropping.  When checked, Glucose was 216.  1st nurse notified

## 2022-12-03 NOTE — ED Notes (Addendum)
See triage note. Pt came in for slurred speech since Saturday. To this RN I do not believe patient sounds like she has slurred speech when asking family member states it is some slurred speech still to her normal baseline but more delayed at being able to respond to questions when asked. Patient currently A&Ox4 but when asked the year needed approximately 30 seconds to be able to respond 2024. Aphasia noted but no other deficits noted at this time. Pt has not had much intake of food since Friday night and vomited food that evening after having a salad. They also state this same type of issue has occurred in the past where she has been admitted for similar symptoms. Pt also gets shots in eye and R eye is very red in the sclera of the eye due to MD not numbing eye enough for shot she jumped causing the redness and pain. Son also states the lamp fell into her due to the dog when she was sitting in the chair and hit her hard in the head.

## 2022-12-03 NOTE — ED Provider Notes (Signed)
[facility] Provider Note    Event Date/Time   First MD Initiated Contact with Patient 12/03/22 0214     (approximate)   History   Aphasia (/) and Eye Pain   HPI  [name] is a 52 y.o. female who presents for 2 different complaints.  She says that she goes to the [facility] to get injections in her right eye to help with her progressive vision loss (she is not entirely sure the cause but thinks it is diabetic retinopathy).  She had an injection on Friday but she jumped at the pain when the needle was inserted and believes that it caused some trauma to the eye.  She has had a little bit of burning and seeing since then in the inner part of the white of her right eye has been very red.  She thinks her vision might be a little bit worse than usual but she cannot tell for sure.    Additionally, starting that evening or possibly on Saturday (approximately 2 days ago), she started having trouble with word finding.  She was not confused but she was having trouble getting her words out.  She and her son do not think that she was slurring her speech although her daughter thought she might of is slurring a little bit.  She was just having trouble expressing herself and remembering and producing the right words.  She denies any weakness in her arms and her legs.  She has had no trouble swallowing.  She thinks her voice might have been a little bit different, and her daughter felt the same way, but she has had no trouble eating or drinking.  She has had at least 1 episode of vomiting and some intermittent nausea.  No chest pain or shortness of breath.  No recent fevers.  No abdominal pain.     Physical Exam   Triage Vital Signs: ED Triage Vitals [12/02/22 2144]  Enc Vitals Group     BP 131/84     Pulse Rate 81     Resp 18     Temp 98.3 F (36.8 C)     Temp Source Oral     SpO2 100 %     Weight 72.6 kg (160 lb)     Height 1.626 m (5\' 4" )     Head Circumference       Peak Flow      Pain Score 3     Pain Loc      Pain Edu?      Excl. in [place]?     Most recent vital signs: Vitals:   12/03/22 0201 12/03/22 0205  BP: (!) 141/99   Pulse: 77   Resp: 20   Temp: 97.6 F (36.4 C)   SpO2: 99% 99%     General: Awake, no obvious distress.  Awake and alert and oriented. Eyes:  Normal-appearing left eye.  Bilateral pupils are equal and reactive.  Right eye has medial with some conjunctival hemorrhage.  Negative Seidel sign.  Pupil is normal in appearance and there is no hyphema nor hypopyon.  Left eye pressure 16 mmHg, right eye pressure 17 mmHg. CV:  Good peripheral perfusion.  Regular rate and rhythm, normal heart sounds. Resp:  Normal effort. Speaking easily and comfortably, no accessory muscle usage nor intercostal retractions.  Clear to auscultation. Abd:  No distention.  Neuro:  Patient does seem to have problems finding words which both she and her son  said is very atypical.  She has no dysarthria.  No obvious facial droop or other cranial nerve deficits.  Extraocular movement is intact.  Normal and equal bilateral upper and lower extremity strength.  However she does seem to be suffering from minimal expressive aphasia, which gives her an NIHSS of 1.   ED Results / Procedures / Treatments   Labs (all labs ordered are listed, but only abnormal results are displayed) Labs Reviewed  COMPREHENSIVE METABOLIC PANEL - Abnormal; Notable for the following components:      Result Value   Sodium 132 (*)    Chloride 97 (*)    Glucose, Bld 207 (*)    Total Protein 8.7 (*)    All other components within normal limits  CBG MONITORING, ED - Abnormal; Notable for the following components:   Glucose-Capillary 204 (*)    All other components within normal limits  CBG MONITORING, ED - Abnormal; Notable for the following components:   Glucose-Capillary 216 (*)    All other components within normal limits  CBC WITH DIFFERENTIAL/PLATELET      RADIOLOGY I  viewed and interpreted the patient's head CT without contrast.  I see no evidence of acute intracranial bleed or obvious CVA.  I also viewed and interpreted her MR brain without contrast.  I do not see any evidence of acute or subacute CVA.  The radiologist mentioned some chronic changes in the pons, so I consulted by phone with Dr. Collins Scotland, the neuroradiologist who read the image.  He reiterated that this is not evidence of an acute or subacute issue and that it seems more like long-term decreased flow leading to some mild ischemia but is not consistent with the patient's symptoms.    PROCEDURES:  Critical Care performed: No  Procedures   MEDICATIONS ORDERED IN ED: Medications - No data to display   IMPRESSION / MDM / [place] / ED COURSE  I reviewed the triage vital signs and the nursing notes.                              Differential diagnosis includes, but is not limited to, CVA, TIA, nonspecific cranial nerve neuritis , acute infectious process, subconjunctival hemorrhage, globe injury after eyeball injection  Patient's presentation is most consistent with acute presentation with potential threat to life or bodily function.  Labs/studies ordered: CT head without contrast, MR brain without contrast, CBC with differential, CMP, CBG [facility] Course my include additional interventions or labs/studies not listed above.)  Evaluation is generally reassuring but her lack or decrease in fluency from baseline is concerning.  Initial lab work and head CT are reassuring.  Her right eye looks generally well and spite of the iatrogenic injury and I believe she simply has a subconjunctival hemorrhage.  Nothing emergent to do at this time although I stressed to her she should follow-up with her ophthalmologist tomorrow with a phone call at the next fillable opportunity.  Given the concern for possible subacute CVA, I ordered MR brain and will reassess the patient after the study.  She  is not a candidate for tPA given that she is well outside the window and has a low NIH stroke scale score.         FINAL CLINICAL IMPRESSION(S) / ED DIAGNOSES   Final diagnoses:  Subconjunctival hemorrhage of right eye  Word finding difficulty     Rx / DC Orders   ED Discharge  Orders     None        Note:  This document was prepared using Dragon voice recognition software and may include unintentional dictation errors.   Hinda Kehr, MD 12/03/22 [phone]

## 2022-12-03 NOTE — Discharge Instructions (Addendum)
Your workup in the Emergency Department today was reassuring.  We did not find any specific abnormalities.  We recommend you drink plenty of fluids, take your regular medications and/or any new ones prescribed today, and follow up with the doctor(s) listed in these documents as recommended.  Return to the Emergency Department if you develop new or worsening symptoms that concern you.  

## 2023-08-09 ENCOUNTER — Other Ambulatory Visit: Payer: Self-pay | Admitting: Family Medicine

## 2023-08-09 DIAGNOSIS — Z1231 Encounter for screening mammogram for malignant neoplasm of breast: Secondary | ICD-10-CM

## 2024-01-10 ENCOUNTER — Encounter
